# Patient Record
Sex: Female | Born: 1990 | Race: Black or African American | Hispanic: No | Marital: Single | Smoking: Never smoker
Health system: Southern US, Community
[De-identification: ages and names within clinical notes are randomized; demographics above are authoritative.]

## PROBLEM LIST (undated history)

## (undated) ENCOUNTER — Inpatient Hospital Stay (HOSPITAL_COMMUNITY): Payer: Self-pay

## (undated) DIAGNOSIS — Z789 Other specified health status: Secondary | ICD-10-CM

## (undated) DIAGNOSIS — R002 Palpitations: Secondary | ICD-10-CM

## (undated) DIAGNOSIS — J45909 Unspecified asthma, uncomplicated: Secondary | ICD-10-CM

## (undated) DIAGNOSIS — T7840XA Allergy, unspecified, initial encounter: Secondary | ICD-10-CM

## (undated) DIAGNOSIS — M7989 Other specified soft tissue disorders: Secondary | ICD-10-CM

## (undated) DIAGNOSIS — R011 Cardiac murmur, unspecified: Secondary | ICD-10-CM

## (undated) DIAGNOSIS — I10 Essential (primary) hypertension: Secondary | ICD-10-CM

## (undated) DIAGNOSIS — F419 Anxiety disorder, unspecified: Secondary | ICD-10-CM

## (undated) DIAGNOSIS — M549 Dorsalgia, unspecified: Secondary | ICD-10-CM

## (undated) DIAGNOSIS — R12 Heartburn: Secondary | ICD-10-CM

## (undated) DIAGNOSIS — N879 Dysplasia of cervix uteri, unspecified: Secondary | ICD-10-CM

## (undated) DIAGNOSIS — K219 Gastro-esophageal reflux disease without esophagitis: Secondary | ICD-10-CM

## (undated) DIAGNOSIS — R079 Chest pain, unspecified: Secondary | ICD-10-CM

## (undated) DIAGNOSIS — E559 Vitamin D deficiency, unspecified: Secondary | ICD-10-CM

## (undated) DIAGNOSIS — L309 Dermatitis, unspecified: Secondary | ICD-10-CM

## (undated) DIAGNOSIS — F909 Attention-deficit hyperactivity disorder, unspecified type: Secondary | ICD-10-CM

## (undated) DIAGNOSIS — B977 Papillomavirus as the cause of diseases classified elsewhere: Secondary | ICD-10-CM

## (undated) DIAGNOSIS — R0602 Shortness of breath: Secondary | ICD-10-CM

## (undated) DIAGNOSIS — M2550 Pain in unspecified joint: Secondary | ICD-10-CM

## (undated) DIAGNOSIS — E785 Hyperlipidemia, unspecified: Secondary | ICD-10-CM

## (undated) HISTORY — DX: Palpitations: R00.2

## (undated) HISTORY — DX: Vitamin D deficiency, unspecified: E55.9

## (undated) HISTORY — DX: Attention-deficit hyperactivity disorder, unspecified type: F90.9

## (undated) HISTORY — DX: Pain in unspecified joint: M25.50

## (undated) HISTORY — DX: Allergy, unspecified, initial encounter: T78.40XA

## (undated) HISTORY — DX: Dermatitis, unspecified: L30.9

## (undated) HISTORY — DX: Gastro-esophageal reflux disease without esophagitis: K21.9

## (undated) HISTORY — DX: Dorsalgia, unspecified: M54.9

## (undated) HISTORY — DX: Chest pain, unspecified: R07.9

## (undated) HISTORY — DX: Hyperlipidemia, unspecified: E78.5

## (undated) HISTORY — DX: Anxiety disorder, unspecified: F41.9

## (undated) HISTORY — DX: Essential (primary) hypertension: I10

## (undated) HISTORY — DX: Heartburn: R12

## (undated) HISTORY — DX: Other specified soft tissue disorders: M79.89

## (undated) HISTORY — DX: Shortness of breath: R06.02

---
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
## 2010-07-22 ENCOUNTER — Emergency Department (HOSPITAL_COMMUNITY)
Admission: EM | Admit: 2010-07-22 | Discharge: 2010-07-22 | Payer: Self-pay | Source: Home / Self Care | Admitting: Family Medicine

## 2011-01-10 ENCOUNTER — Inpatient Hospital Stay (INDEPENDENT_AMBULATORY_CARE_PROVIDER_SITE_OTHER)
Admission: RE | Admit: 2011-01-10 | Discharge: 2011-01-10 | Disposition: A | Discharge status: Home / Self Care | Payer: 59 | Source: Ambulatory Visit | Attending: Family Medicine | Admitting: Family Medicine

## 2011-01-10 DIAGNOSIS — L989 Disorder of the skin and subcutaneous tissue, unspecified: Secondary | ICD-10-CM

## 2011-01-12 LAB — HERPES SIMPLEX VIRUS CULTURE

## 2011-05-02 ENCOUNTER — Inpatient Hospital Stay (INDEPENDENT_AMBULATORY_CARE_PROVIDER_SITE_OTHER)
Admission: RE | Admit: 2011-05-02 | Discharge: 2011-05-02 | Disposition: A | Discharge status: Home / Self Care | Payer: 59 | Source: Ambulatory Visit | Attending: Emergency Medicine | Admitting: Emergency Medicine

## 2011-05-02 DIAGNOSIS — J040 Acute laryngitis: Secondary | ICD-10-CM

## 2011-05-02 DIAGNOSIS — J40 Bronchitis, not specified as acute or chronic: Secondary | ICD-10-CM

## 2011-05-02 DIAGNOSIS — J069 Acute upper respiratory infection, unspecified: Secondary | ICD-10-CM

## 2011-05-02 DIAGNOSIS — J45909 Unspecified asthma, uncomplicated: Secondary | ICD-10-CM

## 2013-04-18 ENCOUNTER — Emergency Department (HOSPITAL_COMMUNITY)
Admission: EM | Admit: 2013-04-18 | Discharge: 2013-04-18 | Disposition: A | Discharge status: Home / Self Care | Payer: 59 | Source: Home / Self Care

## 2013-04-18 ENCOUNTER — Emergency Department (INDEPENDENT_AMBULATORY_CARE_PROVIDER_SITE_OTHER): Payer: 59

## 2013-04-18 ENCOUNTER — Encounter (HOSPITAL_COMMUNITY): Payer: Self-pay | Admitting: *Deleted

## 2013-04-18 DIAGNOSIS — M5416 Radiculopathy, lumbar region: Secondary | ICD-10-CM

## 2013-04-18 DIAGNOSIS — IMO0002 Reserved for concepts with insufficient information to code with codable children: Secondary | ICD-10-CM

## 2013-04-18 MED ORDER — PREDNISONE 10 MG PO KIT: PACK | ORAL | Status: DC

## 2013-04-18 NOTE — ED Provider Notes (Signed)
Megan Norris is a 22 y.o. female who presents to Urgent Care today for right leg numbness. 2 months ago patient fell down concrete stairs landing on her buttocks several times. She notes mild to moderate right leg numbness and tingling from her buttocks to her foot. She denies any weakness or significant pain. Additionally she denies any back pain or buttocks pain. She has not tried any medications for this. Additionally she denies any bowel bladder dysfunction or difficulty walking. Her numbness and tingling is worse with prolonged standing and better with rest. She denies any nausea vomiting diarrhea and fevers and chills.    PMH reviewed. Healthy otherwise History  Substance Use Topics  . Smoking status: Never Smoker   . Smokeless tobacco: Not on file  . Alcohol Use: Not on file   ROS as above Medications reviewed. No current facility-administered medications for this encounter.   Current Outpatient Prescriptions  Medication Sig Dispense Refill  . montelukast (SINGULAIR) 10 MG tablet Take 10 mg by mouth at bedtime.      . PredniSONE 10 MG KIT 12 day dose pack po  1 kit  0    Exam:  BP 145/106  Pulse 79  Temp(Src) 98.8 F (37.1 C) (Oral)  Resp 18  SpO2 98%  LMP 04/12/2013 Gen: Well NAD HEENT: EOMI,  MMM Lungs: CTABL Nl WOB Heart: RRR no MRG Abd: NABS, NT, ND Exts: Non edematous BL  LE, warm and well perfused.  Back: Nontender to spinal midline normal back range of motion. Negative straight leg raise test Sensation is subjectively decreased in the right leg compared to the left Reflexes are diminished but equal bilaterally Strength is intact to hip flexion abduction, adduction Knee extension and flexion Ankle and great toe dorsiflexion and plantarflexion bilaterally  Capillary refill is intact distally Gait is normal.  No results found for this or any previous visit (from the past 24 hour(s)). Dg Lumbar Spine Complete  04/18/2013   *RADIOLOGY REPORT*  Clinical Data:  Right leg radiculopathy.  History of recent fall.  LUMBAR SPINE - COMPLETE 4+ VIEW  Comparison: No priors.  Findings: Five views of the lumbar spine demonstrate no definite acute displaced fracture or compression type fracture.  Alignment is anatomic.  No defects of the pars interarticularis are noted.  IMPRESSION: 1.  No acute radiographic abnormality of the lumbar spine to account for the patient's symptoms.   Original Report Authenticated By: Trudie Reed, M.D.   Dg Pelvis 1-2 Views  04/18/2013   *RADIOLOGY REPORT*  Clinical Data: Status post fall 2 months ago.  Right leg numbness. Low back pain.  PELVIS - 1-2 VIEW  Comparison: None.  Findings: Imaged bones, joints and soft tissues appear normal.  IMPRESSION: Normal examination.   Original Report Authenticated By: Holley Dexter, M.D.    Assessment and Plan: 22 y.o. female with right leg radicular symptoms.  Think the most likely explanation was direct trauma to the sciatic nerve 2 months ago.  She has bothersome numbness and tingling in her leg but does not have significant pain or weakness.  The x-rays today were as expected normal.  I think the most reasonable course of action here is for a 12 day prednisone dose pack, followed by referral to sports medicine. If patient is still symptomatic in 2 or 3 weeks an MRI or I nerve conduction study would be reasonable.  Discussed warning signs or symptoms. Please see discharge instructions. Patient expresses understanding.      Rodolph Bong,  MD 04/18/13 1010

## 2013-04-18 NOTE — ED Notes (Signed)
Pt c/o numbness in right leg x 1 week and lower to mid back pain. Pt stated she fell down a flight of concrete stairs  X 2 months ago and it could be related to sxs. Pt denies taking any meds for relief. Jan Ranson, SMA

## 2013-04-27 ENCOUNTER — Emergency Department (HOSPITAL_COMMUNITY)
Admission: EM | Admit: 2013-04-27 | Discharge: 2013-04-27 | Disposition: A | Discharge status: Home / Self Care | Payer: 59 | Attending: Emergency Medicine | Admitting: Emergency Medicine

## 2013-04-27 ENCOUNTER — Emergency Department (HOSPITAL_COMMUNITY): Payer: 59

## 2013-04-27 ENCOUNTER — Encounter (HOSPITAL_COMMUNITY): Payer: Self-pay | Admitting: *Deleted

## 2013-04-27 DIAGNOSIS — J45909 Unspecified asthma, uncomplicated: Secondary | ICD-10-CM | POA: Insufficient documentation

## 2013-04-27 DIAGNOSIS — Z79899 Other long term (current) drug therapy: Secondary | ICD-10-CM | POA: Insufficient documentation

## 2013-04-27 DIAGNOSIS — IMO0002 Reserved for concepts with insufficient information to code with codable children: Secondary | ICD-10-CM | POA: Insufficient documentation

## 2013-04-27 DIAGNOSIS — R0789 Other chest pain: Secondary | ICD-10-CM | POA: Diagnosis present

## 2013-04-27 DIAGNOSIS — R011 Cardiac murmur, unspecified: Secondary | ICD-10-CM | POA: Insufficient documentation

## 2013-04-27 HISTORY — DX: Cardiac murmur, unspecified: R01.1

## 2013-04-27 HISTORY — DX: Unspecified asthma, uncomplicated: J45.909

## 2013-04-27 MED ORDER — ACETAMINOPHEN 325 MG PO TABS
650.0000 mg | ORAL_TABLET | Freq: Once | ORAL | Status: AC
  Administered 2013-04-27: 650 mg via ORAL
  Filled 2013-04-27: qty 2

## 2013-04-27 MED ORDER — ALBUTEROL SULFATE HFA 108 (90 BASE) MCG/ACT IN AERS: 1.0000 | INHALATION_SPRAY | Freq: Four times a day (QID) | RESPIRATORY_TRACT | Status: DC | PRN

## 2013-04-27 MED ORDER — ALBUTEROL SULFATE HFA 108 (90 BASE) MCG/ACT IN AERS
4.0000 | INHALATION_SPRAY | Freq: Once | RESPIRATORY_TRACT | Status: AC
  Administered 2013-04-27: 4 via RESPIRATORY_TRACT
  Filled 2013-04-27: qty 6.7

## 2013-04-27 NOTE — ED Provider Notes (Signed)
CSN: [id number]     Arrival date & time 04/27/13  2841 History   First MD Initiated Contact with Patient 04/27/13 [id number]     Chief Complaint  Patient presents with  . Chest Pain   (Consider location/radiation/quality/duration/timing/severity/associated sxs/prior Treatment) Patient is a 22 y.o. female presenting with chest pain. The history is provided by the patient.  Chest Pain Pain location:  Substernal area Pain quality: pressure   Pain radiates to:  Does not radiate Pain radiates to the back: no   Pain severity:  Mild Onset quality:  Sudden Duration:  6 hours Timing:  Constant Progression:  Waxing and waning Chronicity:  New Context comment:  While walking down the hall Relieved by:  Nothing Worsened by:  Nothing tried Ineffective treatments:  None tried Associated symptoms: no abdominal pain, no back pain, no cough, no dizziness, no fatigue, no fever, no headache, no nausea, no shortness of breath and not vomiting     Past Medical History  Diagnosis Date  . Heart murmur   . Asthma    History reviewed. No pertinent past surgical history. History reviewed. No pertinent family history. History  Substance Use Topics  . Smoking status: Never Smoker   . Smokeless tobacco: Not on file  . Alcohol Use: Yes   OB History   Grav Para Term Preterm Abortions TAB SAB Ect Mult Living                 Review of Systems  Constitutional: Negative for fever and fatigue.  HENT: Negative for congestion, drooling and neck pain.   Eyes: Negative for pain.  Respiratory: Negative for cough and shortness of breath.   Cardiovascular: Positive for chest pain.  Gastrointestinal: Negative for nausea, vomiting, abdominal pain and diarrhea.  Genitourinary: Negative for dysuria and hematuria.  Musculoskeletal: Negative for back pain and gait problem.  Skin: Negative for color change.  Neurological: Negative for dizziness and headaches.  Hematological: Negative for adenopathy.   Psychiatric/Behavioral: Negative for behavioral problems.  All other systems reviewed and are negative.    Allergies  Sulfa antibiotics  Home Medications   Current Outpatient Rx  Name  Route  Sig  Dispense  Refill  . montelukast (SINGULAIR) 10 MG tablet   Oral   Take 10 mg by mouth at bedtime.         . PredniSONE 10 MG KIT      12 day dose pack po   1 kit   0    BP 143/89  Temp(Src) 98.2 F (36.8 C) (Oral)  Resp 14  SpO2 98%  LMP 04/12/2013 Physical Exam  Nursing note and vitals reviewed. Constitutional: She is oriented to person, place, and time. She appears well-developed and well-nourished.  HENT:  Head: Normocephalic.  Mouth/Throat: No oropharyngeal exudate.  Eyes: Conjunctivae and EOM are normal. Pupils are equal, round, and reactive to light.  Neck: Normal range of motion. Neck supple.  Cardiovascular: Normal rate, regular rhythm, normal heart sounds and intact distal pulses.  Exam reveals no gallop and no friction rub.   No murmur heard. Pulmonary/Chest: Effort normal and breath sounds normal. No respiratory distress. She has no wheezes. She exhibits no tenderness.  Abdominal: Soft. Bowel sounds are normal. There is no tenderness. There is no rebound and no guarding.  Musculoskeletal: Normal range of motion. She exhibits no edema and no tenderness.  Neurological: She is alert and oriented to person, place, and time.  Skin: Skin is warm and dry.  Psychiatric: She has  a normal mood and affect. Her behavior is normal.    ED Course  Procedures (including critical care time) Labs Review Labs Reviewed - No data to display Imaging Review Dg Chest 2 View  04/27/2013   *RADIOLOGY REPORT*  Clinical Data: Chest pain and pressure.  CHEST - 2 VIEW  Comparison: 07/22/2010  Findings: The heart size and pulmonary vascularity are normal. The lungs appear clear and expanded without focal air space disease or consolidation. No blunting of the costophrenic angles.  No  pneumothorax.  Mediastinal contours appear intact.  No significant change since previous study.  IMPRESSION: No evidence of active pulmonary disease.   Original Report Authenticated By: Burman Nieves, M.D.      Date: 04/27/2013  Rate: 71  Rhythm: normal sinus rhythm  QRS Axis: normal  Intervals: normal  ST/T Wave abnormalities: normal  Conduction Disutrbances:none  Narrative Interpretation: No ST or T wave changes consistent with ischemia.    Old EKG Reviewed: none available   MDM   1. Atypical chest pain    3:57 AM 22 y.o. female w hx of asthma currently on prednisone for suspected radiculopathy who pw central cp that began at 10pm yesterday evening while walking down the hall. The patient notes waxing waning pain since then. Currently very mild discomfort on exam. The patient appears well and is in no acute distress. Her vital signs are unremarkable here. She is well negative but does take oral contraceptives. She she notes she has had mild shortness of breath for one week which she attributes to asthma. Low suspicion for PE as the patient is a nonsmoker, not tachycardic, not short of breath on exam, and otherwise appears well. 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 Will get chest x-ray, EKG, albuterol to see if it provides any relief.  5:45 AM: Pt feeling better. Sx likely assoc w/ mild asthma. I have discussed the diagnosis/risks/treatment options with the patient and believe the pt to be eligible for discharge home to follow-up with pcp as needed. We also discussed returning to the ED immediately if new or worsening sx occur. We discussed the sx which are most concerning (e.g., return of pain, sob, fever) that necessitate immediate return. Any new prescriptions provided to the patient are listed below.  Discharge Medication List as of 04/27/2013  5:47 AM    START taking these medications   Details  !! albuterol  (PROVENTIL HFA;VENTOLIN HFA) 108 (90 BASE) MCG/ACT inhaler Inhale 1-2 puffs into the lungs every 6 (six) hours as needed for wheezing., Starting 04/27/2013, Until Discontinued, Print     !! - Potential duplicate medications found. Please discuss with provider.       Junius Argyle, MD 04/27/13 [phone]

## 2013-04-27 NOTE — ED Notes (Signed)
Pt states that last week she had swelling bilateral legs. Pt states that the swelling went away after sleeping at night.

## 2013-10-11 ENCOUNTER — Other Ambulatory Visit: Payer: Self-pay | Admitting: Physician Assistant

## 2013-10-11 DIAGNOSIS — R58 Hemorrhage, not elsewhere classified: Secondary | ICD-10-CM

## 2013-10-17 ENCOUNTER — Ambulatory Visit
Admission: RE | Admit: 2013-10-17 | Discharge: 2013-10-17 | Disposition: A | Discharge status: Home / Self Care | Payer: BC Managed Care – PPO | Source: Ambulatory Visit | Attending: Physician Assistant | Admitting: Physician Assistant

## 2013-10-17 DIAGNOSIS — R58 Hemorrhage, not elsewhere classified: Secondary | ICD-10-CM

## 2015-05-02 DIAGNOSIS — J4520 Mild intermittent asthma, uncomplicated: Secondary | ICD-10-CM | POA: Insufficient documentation

## 2015-05-02 DIAGNOSIS — J309 Allergic rhinitis, unspecified: Principal | ICD-10-CM

## 2015-05-02 DIAGNOSIS — H1010 Acute atopic conjunctivitis, unspecified eye: Secondary | ICD-10-CM

## 2015-05-29 ENCOUNTER — Ambulatory Visit (INDEPENDENT_AMBULATORY_CARE_PROVIDER_SITE_OTHER): Payer: 59

## 2015-05-29 ENCOUNTER — Other Ambulatory Visit: Payer: Self-pay | Admitting: *Deleted

## 2015-05-29 DIAGNOSIS — J309 Allergic rhinitis, unspecified: Secondary | ICD-10-CM

## 2015-05-29 NOTE — Telephone Encounter (Signed)
Denied Epipen 2 Pack pt needs office visit last visit 06/11/13. Faxed back to [facility] [phone].

## 2015-06-11 ENCOUNTER — Ambulatory Visit (INDEPENDENT_AMBULATORY_CARE_PROVIDER_SITE_OTHER): Payer: 59

## 2015-06-11 DIAGNOSIS — J4540 Moderate persistent asthma, uncomplicated: Secondary | ICD-10-CM

## 2015-07-21 DIAGNOSIS — J309 Allergic rhinitis, unspecified: Secondary | ICD-10-CM

## 2015-07-21 NOTE — Progress Notes (Signed)
This encounter was created in error - please disregard.

## 2015-07-31 ENCOUNTER — Encounter: Payer: 59 | Admitting: Neurology

## 2015-08-04 ENCOUNTER — Ambulatory Visit (INDEPENDENT_AMBULATORY_CARE_PROVIDER_SITE_OTHER): Payer: 59 | Admitting: *Deleted

## 2015-08-04 DIAGNOSIS — J309 Allergic rhinitis, unspecified: Secondary | ICD-10-CM | POA: Diagnosis not present

## 2015-08-07 NOTE — Progress Notes (Signed)
This encounter was created in error - please disregard.

## 2015-08-21 ENCOUNTER — Ambulatory Visit (INDEPENDENT_AMBULATORY_CARE_PROVIDER_SITE_OTHER): Payer: 59 | Admitting: Neurology

## 2015-08-21 DIAGNOSIS — J309 Allergic rhinitis, unspecified: Secondary | ICD-10-CM | POA: Diagnosis not present

## 2015-08-23 HISTORY — PX: WISDOM TOOTH EXTRACTION: SHX21

## 2015-08-28 ENCOUNTER — Ambulatory Visit (INDEPENDENT_AMBULATORY_CARE_PROVIDER_SITE_OTHER): Payer: 59 | Admitting: *Deleted

## 2015-08-28 DIAGNOSIS — J309 Allergic rhinitis, unspecified: Secondary | ICD-10-CM

## 2015-09-08 ENCOUNTER — Ambulatory Visit (INDEPENDENT_AMBULATORY_CARE_PROVIDER_SITE_OTHER): Payer: 59 | Admitting: *Deleted

## 2015-09-08 DIAGNOSIS — J309 Allergic rhinitis, unspecified: Secondary | ICD-10-CM | POA: Diagnosis not present

## 2015-09-17 ENCOUNTER — Ambulatory Visit (INDEPENDENT_AMBULATORY_CARE_PROVIDER_SITE_OTHER): Payer: 59

## 2015-09-17 DIAGNOSIS — J309 Allergic rhinitis, unspecified: Secondary | ICD-10-CM

## 2015-10-08 DIAGNOSIS — J301 Allergic rhinitis due to pollen: Secondary | ICD-10-CM | POA: Diagnosis not present

## 2015-10-09 DIAGNOSIS — J3081 Allergic rhinitis due to animal (cat) (dog) hair and dander: Secondary | ICD-10-CM | POA: Diagnosis not present

## 2015-10-16 ENCOUNTER — Ambulatory Visit (INDEPENDENT_AMBULATORY_CARE_PROVIDER_SITE_OTHER): Payer: 59

## 2015-10-16 DIAGNOSIS — J309 Allergic rhinitis, unspecified: Secondary | ICD-10-CM | POA: Diagnosis not present

## 2015-10-21 MED FILL — HYDROCODON-APAP 10-325: 10-325 | 5 days supply | Qty: 20 | Fill #0

## 2015-10-23 ENCOUNTER — Ambulatory Visit (INDEPENDENT_AMBULATORY_CARE_PROVIDER_SITE_OTHER): Payer: 59 | Admitting: *Deleted

## 2015-10-23 DIAGNOSIS — J309 Allergic rhinitis, unspecified: Secondary | ICD-10-CM

## 2015-11-02 MED FILL — LEVOCETIRIZINE 5 MG TABLET: 5 | 30 days supply | Qty: 30 | Fill #0

## 2015-11-02 MED FILL — AZITHROMYCIN 250 MG TABLET: 250 | 5 days supply | Qty: 6 | Fill #0

## 2015-11-02 MED FILL — VIT D2 1.25 MG (50,000 UNIT: 1.25 MG | 30 days supply | Qty: 9 | Fill #2

## 2015-11-02 MED FILL — MONTELUKAST SOD 10 MG TAB: 10 | 30 days supply | Qty: 30 | Fill #0

## 2015-11-12 ENCOUNTER — Ambulatory Visit (INDEPENDENT_AMBULATORY_CARE_PROVIDER_SITE_OTHER): Payer: 59

## 2015-11-12 DIAGNOSIS — J309 Allergic rhinitis, unspecified: Secondary | ICD-10-CM | POA: Diagnosis not present

## 2016-01-05 ENCOUNTER — Ambulatory Visit (INDEPENDENT_AMBULATORY_CARE_PROVIDER_SITE_OTHER): Payer: 59

## 2016-01-05 DIAGNOSIS — J309 Allergic rhinitis, unspecified: Secondary | ICD-10-CM

## 2016-02-11 ENCOUNTER — Ambulatory Visit (INDEPENDENT_AMBULATORY_CARE_PROVIDER_SITE_OTHER): Payer: 59

## 2016-02-11 DIAGNOSIS — J309 Allergic rhinitis, unspecified: Secondary | ICD-10-CM

## 2016-03-21 MED FILL — LEVOCETIRIZINE 5 MG TABLET: 5 | 30 days supply | Qty: 30 | Fill #1

## 2016-03-21 MED FILL — MONTELUKAST SOD 10 MG TAB: 10 | 30 days supply | Qty: 30 | Fill #1

## 2016-03-24 ENCOUNTER — Ambulatory Visit: Payer: Self-pay

## 2016-03-24 DIAGNOSIS — J309 Allergic rhinitis, unspecified: Secondary | ICD-10-CM

## 2016-04-01 DIAGNOSIS — J3089 Other allergic rhinitis: Secondary | ICD-10-CM | POA: Diagnosis not present

## 2016-04-15 ENCOUNTER — Ambulatory Visit (INDEPENDENT_AMBULATORY_CARE_PROVIDER_SITE_OTHER): Payer: 59

## 2016-04-15 DIAGNOSIS — J309 Allergic rhinitis, unspecified: Secondary | ICD-10-CM

## 2016-04-15 NOTE — Progress Notes (Signed)
Immunotherapy   Patient Details  Name: Megan Norris MRN: [id number] Date of Birth: 08/21/1991  04/15/2016  Megan Norris  Patient came in today to receive ITX and pick-up vials.  She will be receiving ITX at the office of Malachy Chamberakia Starkes, NP on [address] [place].   Following schedule: B  Frequency:Once weekly Epi-Pen:Yes--Patient states EpiPen is current and she understands its use.   Consent signed and patient instructions given. All paperwork and vials released to patient.  Proper paperwork received and signed by Malachy Chamberakia Starkes, NP and sent to be scanned into EPIC.   Nida Boatmanatricia Khalis Hittle 04/15/2016, 4:57 PM

## 2016-05-19 MED FILL — predniSONE 10 MG TABS: 10 | 6 days supply | Qty: 21 | Fill #0

## 2016-05-26 ENCOUNTER — Telehealth: Payer: Self-pay | Admitting: *Deleted

## 2016-05-26 NOTE — Telephone Encounter (Signed)
PT WOULD LIKE A RETURN CALL REGARDING HER BILL. 

## 2016-05-26 NOTE — Telephone Encounter (Signed)
WE HAD TURNED HER OVER IN MM BECAUSE WE DID NOT CHANGED HER ADDRESS WHEN IT WAS CHANGED IN EPIC - PULLED FROM [facility]  - SHE WILL PAY TODAY

## 2016-08-22 DIAGNOSIS — B977 Papillomavirus as the cause of diseases classified elsewhere: Secondary | ICD-10-CM

## 2016-08-22 HISTORY — PX: LEEP: SHX91

## 2016-08-22 HISTORY — DX: Papillomavirus as the cause of diseases classified elsewhere: B97.7

## 2016-09-15 MED FILL — MONTELUKAST SOD 10 MG TAB: 10 | 30 days supply | Qty: 30 | Fill #2

## 2016-09-15 MED FILL — JOLESSA 0.15 MG-0.03 MG TAB: 0.15-0.03 | 90 days supply | Qty: 91 | Fill #1

## 2016-09-15 MED FILL — LEVOCETIRIZINE 5 MG TABLET: 5 | 30 days supply | Qty: 30 | Fill #2

## 2016-09-15 MED FILL — JOLESSA 0.15 MG-0.03 MG TAB: 0.15-0.03 | 90 days supply | Qty: 91 | Fill #0

## 2016-09-19 ENCOUNTER — Other Ambulatory Visit: Payer: Self-pay | Admitting: Family

## 2016-09-19 DIAGNOSIS — Z7251 High risk heterosexual behavior: Secondary | ICD-10-CM | POA: Diagnosis not present

## 2016-09-19 DIAGNOSIS — N6012 Diffuse cystic mastopathy of left breast: Secondary | ICD-10-CM | POA: Diagnosis not present

## 2016-09-19 DIAGNOSIS — E559 Vitamin D deficiency, unspecified: Secondary | ICD-10-CM | POA: Diagnosis not present

## 2016-09-19 DIAGNOSIS — Z79899 Other long term (current) drug therapy: Secondary | ICD-10-CM | POA: Diagnosis not present

## 2016-09-19 DIAGNOSIS — Z0000 Encounter for general adult medical examination without abnormal findings: Secondary | ICD-10-CM | POA: Diagnosis not present

## 2016-09-21 NOTE — Addendum Note (Signed)
Addended by: Maryjean MornFREEMAN, Alizabeth Antonio D on: 09/21/2016 10:41 AM   Modules accepted: Orders

## 2016-10-03 ENCOUNTER — Ambulatory Visit
Admission: RE | Admit: 2016-10-03 | Discharge: 2016-10-03 | Disposition: A | Discharge status: Home / Self Care | Payer: 59 | Source: Ambulatory Visit | Attending: Family | Admitting: Family

## 2016-10-03 ENCOUNTER — Ambulatory Visit
Admission: RE | Admit: 2016-10-03 | Discharge: 2016-10-03 | Disposition: A | Discharge status: Home / Self Care | Payer: Self-pay | Source: Ambulatory Visit | Attending: Family | Admitting: Family

## 2016-10-03 DIAGNOSIS — N6012 Diffuse cystic mastopathy of left breast: Secondary | ICD-10-CM

## 2016-10-03 DIAGNOSIS — N644 Mastodynia: Secondary | ICD-10-CM | POA: Diagnosis not present

## 2016-10-06 MED FILL — VIT D2 1.25 MG (50,000 UNIT: 1.25 MG | 28 days supply | Qty: 8 | Fill #0

## 2016-11-22 MED FILL — LEVOCETIRIZINE 5 MG TABLET: 5 | 30 days supply | Qty: 30 | Fill #0

## 2016-11-22 MED FILL — MONTELUKAST SOD 10 MG TAB: 10 | 90 days supply | Qty: 90 | Fill #0

## 2016-12-06 MED FILL — JOLESSA 0.15 MG-0.03 MG TAB: 0.15-0.03 | 90 days supply | Qty: 91 | Fill #0

## 2017-01-17 ENCOUNTER — Other Ambulatory Visit: Payer: Self-pay | Admitting: Obstetrics and Gynecology

## 2017-01-17 ENCOUNTER — Other Ambulatory Visit (HOSPITAL_COMMUNITY)
Admission: RE | Admit: 2017-01-17 | Discharge: 2017-01-17 | Disposition: A | Discharge status: Home / Self Care | Payer: 59 | Source: Ambulatory Visit | Attending: Obstetrics and Gynecology | Admitting: Obstetrics and Gynecology

## 2017-01-17 DIAGNOSIS — Z124 Encounter for screening for malignant neoplasm of cervix: Secondary | ICD-10-CM | POA: Insufficient documentation

## 2017-01-17 DIAGNOSIS — R87610 Atypical squamous cells of undetermined significance on cytologic smear of cervix (ASC-US): Secondary | ICD-10-CM | POA: Diagnosis not present

## 2017-01-17 DIAGNOSIS — N898 Other specified noninflammatory disorders of vagina: Secondary | ICD-10-CM | POA: Diagnosis not present

## 2017-01-17 DIAGNOSIS — Z01411 Encounter for gynecological examination (general) (routine) with abnormal findings: Secondary | ICD-10-CM | POA: Diagnosis not present

## 2017-01-24 LAB — CYTOLOGY - PAP
DIAGNOSIS: UNDETERMINED — AB
HPV: DETECTED — AB

## 2017-02-13 MED FILL — LEVOCETIRIZINE 5 MG TABLET: 5 | 30 days supply | Qty: 30 | Fill #1

## 2017-02-14 ENCOUNTER — Other Ambulatory Visit: Payer: Self-pay | Admitting: Obstetrics and Gynecology

## 2017-02-14 DIAGNOSIS — N871 Moderate cervical dysplasia: Secondary | ICD-10-CM | POA: Diagnosis not present

## 2017-02-14 DIAGNOSIS — Z3202 Encounter for pregnancy test, result negative: Secondary | ICD-10-CM | POA: Diagnosis not present

## 2017-02-14 DIAGNOSIS — R87610 Atypical squamous cells of undetermined significance on cytologic smear of cervix (ASC-US): Secondary | ICD-10-CM | POA: Diagnosis not present

## 2017-02-14 DIAGNOSIS — R87810 Cervical high risk human papillomavirus (HPV) DNA test positive: Secondary | ICD-10-CM | POA: Diagnosis not present

## 2017-02-14 DIAGNOSIS — R87619 Unspecified abnormal cytological findings in specimens from cervix uteri: Secondary | ICD-10-CM | POA: Diagnosis not present

## 2017-02-14 DIAGNOSIS — Z113 Encounter for screening for infections with a predominantly sexual mode of transmission: Secondary | ICD-10-CM | POA: Diagnosis not present

## 2017-02-14 DIAGNOSIS — N870 Mild cervical dysplasia: Secondary | ICD-10-CM | POA: Diagnosis not present

## 2017-02-15 DIAGNOSIS — F902 Attention-deficit hyperactivity disorder, combined type: Secondary | ICD-10-CM | POA: Diagnosis not present

## 2017-02-15 MED FILL — TINIDAZOLE 500 MG TABLET: 500 | 5 days supply | Qty: 10 | Fill #0

## 2017-02-20 MED FILL — MONTELUKAST SOD 10 MG TAB: 10 | 90 days supply | Qty: 90 | Fill #1

## 2017-02-20 MED FILL — CONCERTA ER 27 MG TABLET: 27 | 30 days supply | Qty: 30 | Fill #0

## 2017-02-20 MED FILL — JOLESSA 0.15 MG-0.03 MG TAB: 0.15-0.03 | 90 days supply | Qty: 91 | Fill #1

## 2017-07-03 MED FILL — LEVOCETIRIZINE 5 MG TABLET: 5 | 30 days supply | Qty: 30 | Fill #2

## 2017-07-03 MED FILL — medroxyPROGESTERone ACETATE: 150 | 84 days supply | Qty: 1 | Fill #0

## 2017-08-18 LAB — OB RESULTS CONSOLE RPR: RPR: NONREACTIVE

## 2017-08-18 LAB — OB RESULTS CONSOLE HEPATITIS B SURFACE ANTIGEN: HEP B S AG: NEGATIVE

## 2017-08-18 LAB — OB RESULTS CONSOLE RUBELLA ANTIBODY, IGM: RUBELLA: IMMUNE

## 2017-08-18 LAB — OB RESULTS CONSOLE HIV ANTIBODY (ROUTINE TESTING): HIV: NONREACTIVE

## 2017-08-22 NOTE — L&D Delivery Note (Signed)
Operative Delivery Note  There was distress with repetitive late decels after mother was noted to be C/C/+3 and pushing.  Discussed with patient operative delivery with a vacuum. Verbal consent: obtained from patient.   Risks and benefits discussed in detail.  Risks include, but are not limited to the risks of anesthesia, bleeding, infection, damage to maternal tissues, fetal cephalhematoma.  There is also the risk of inability to effect vaginal delivery of the head, or shoulder dystocia that cannot be resolved by established maneuvers, leading to the need for emergency cesarean section.  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 Bladder was just previously emptied by removing the foley catheter. Epidural was found to be adequate.  Vertex was +3 station.  The Kiwi vacuum was placed.  With maternal effort and 1 pull,  At 4:10 AM a  viable and healthy female was delivered via Vaginal, Vacuum delivery.   Presentation: vertex; Position: Occiput,, Anterior; shoulders and body were easily delivered. Cord was double clamped and cut and the infant handed to the waiting Pediatricians.  Infant was noted to be moving all four extremities and crying vigorously. Cord gases and cord blood was obtained. The placenta delivered spontaneously intact 3 vessels noted.    Uterine atony was alleviated by massage and IV pitocin.  No lacerations noted.   APGAR: 8, 9; weight 3 lb 15.5 oz (1800 g).   Anesthesia:  Epidural Instruments: Kiwi Episiotomy: None Lacerations: None Suture Repair: n/a Est. Blood Loss (mL): 300  Mom to postpartum.  Baby to NICU.  Essie HartINN, Pheonix Cl[employer] STACIA 02/28/2018, 5:47 AM

## 2017-09-18 ENCOUNTER — Other Ambulatory Visit (HOSPITAL_COMMUNITY)
Admission: RE | Admit: 2017-09-18 | Discharge: 2017-09-18 | Disposition: A | Discharge status: Home / Self Care | Payer: Self-pay | Source: Ambulatory Visit | Attending: Obstetrics and Gynecology | Admitting: Obstetrics and Gynecology

## 2017-09-18 ENCOUNTER — Other Ambulatory Visit: Payer: Self-pay | Admitting: Obstetrics and Gynecology

## 2017-09-18 DIAGNOSIS — Z01419 Encounter for gynecological examination (general) (routine) without abnormal findings: Secondary | ICD-10-CM | POA: Insufficient documentation

## 2017-09-18 LAB — OB RESULTS CONSOLE GC/CHLAMYDIA: CHLAMYDIA, DNA PROBE: NEGATIVE

## 2017-09-18 LAB — OB RESULTS CONSOLE ABO/RH: RH Type: POSITIVE

## 2017-09-21 LAB — CYTOLOGY - PAP
Diagnosis: NEGATIVE
HPV: NOT DETECTED

## 2018-01-17 MED FILL — MONTELUKAST SOD 10 MG TAB: 10 | 30 days supply | Qty: 30 | Fill #0

## 2018-01-17 MED FILL — LEVOCETIRIZINE 5 MG TABLET: 5 | 30 days supply | Qty: 30 | Fill #0

## 2018-01-23 ENCOUNTER — Inpatient Hospital Stay (HOSPITAL_COMMUNITY)
Admission: AD | Admit: 2018-01-23 | Discharge: 2018-01-23 | Disposition: A | Discharge status: Home / Self Care | Payer: Medicaid Other | Source: Ambulatory Visit | Attending: Obstetrics and Gynecology | Admitting: Obstetrics and Gynecology

## 2018-01-23 ENCOUNTER — Encounter (HOSPITAL_COMMUNITY): Payer: Self-pay | Admitting: *Deleted

## 2018-01-23 ENCOUNTER — Other Ambulatory Visit: Payer: Self-pay

## 2018-01-23 DIAGNOSIS — O26893 Other specified pregnancy related conditions, third trimester: Secondary | ICD-10-CM | POA: Diagnosis not present

## 2018-01-23 DIAGNOSIS — R109 Unspecified abdominal pain: Secondary | ICD-10-CM | POA: Diagnosis present

## 2018-01-23 DIAGNOSIS — Z3A30 30 weeks gestation of pregnancy: Secondary | ICD-10-CM | POA: Diagnosis not present

## 2018-01-23 DIAGNOSIS — N898 Other specified noninflammatory disorders of vagina: Secondary | ICD-10-CM | POA: Insufficient documentation

## 2018-01-23 DIAGNOSIS — J45909 Unspecified asthma, uncomplicated: Secondary | ICD-10-CM | POA: Insufficient documentation

## 2018-01-23 DIAGNOSIS — Z79899 Other long term (current) drug therapy: Secondary | ICD-10-CM | POA: Diagnosis not present

## 2018-01-23 DIAGNOSIS — Z3689 Encounter for other specified antenatal screening: Secondary | ICD-10-CM

## 2018-01-23 DIAGNOSIS — O99513 Diseases of the respiratory system complicating pregnancy, third trimester: Secondary | ICD-10-CM | POA: Insufficient documentation

## 2018-01-23 LAB — URINALYSIS, ROUTINE W REFLEX MICROSCOPIC
BILIRUBIN URINE: NEGATIVE
Glucose, UA: NEGATIVE mg/dL
HGB URINE DIPSTICK: NEGATIVE
Ketones, ur: NEGATIVE mg/dL
Leukocytes, UA: NEGATIVE
Nitrite: NEGATIVE
PROTEIN: NEGATIVE mg/dL
SPECIFIC GRAVITY, URINE: 1.006 (ref 1.005–1.030)
pH: 6 (ref 5.0–8.0)

## 2018-01-23 LAB — WET PREP, GENITAL
CLUE CELLS WET PREP: NONE SEEN
Sperm: NONE SEEN
TRICH WET PREP: NONE SEEN
Yeast Wet Prep HPF POC: NONE SEEN

## 2018-01-23 NOTE — MAU Note (Signed)
Been leaking since last night, was thick and creamish, today was thinner and yellow, still having some mucous substance to it.  Having some cramping here and there( mild every couple hours).  At times feels burning in her pelvis, not pressure.

## 2018-01-23 NOTE — Discharge Instructions (Signed)
Braxton Hicks Contractions °Contractions of the uterus can occur throughout pregnancy, but they are not always a sign that you are in labor. You may have practice contractions called Braxton Hicks contractions. These false labor contractions are sometimes confused with true labor. °What are Braxton Hicks contractions? °Braxton Hicks contractions are tightening movements that occur in the muscles of the uterus before labor. Unlike true labor contractions, these contractions do not result in opening (dilation) and thinning of the cervix. Toward the end of pregnancy (32-34 weeks), Braxton Hicks contractions can happen more often and may become stronger. These contractions are sometimes difficult to tell apart from true labor because they can be very uncomfortable. You should not feel embarrassed if you go to the hospital with false labor. °Sometimes, the only way to tell if you are in true labor is for your health care provider to look for changes in the cervix. The health care provider will do a physical exam and may monitor your contractions. If you are not in true labor, the exam should show that your cervix is not dilating and your water has not broken. °If there are other health problems associated with your pregnancy, it is completely safe for you to be sent home with false labor. You may continue to have Braxton Hicks contractions until you go into true labor. °How to tell the difference between true labor and false labor °True labor °· Contractions last 30-70 seconds. °· Contractions become very regular. °· Discomfort is usually felt in the top of the uterus, and it spreads to the lower abdomen and low back. °· Contractions do not go away with walking. °· Contractions usually become more intense and increase in frequency. °· The cervix dilates and gets thinner. °False labor °· Contractions are usually shorter and not as strong as true labor contractions. °· Contractions are usually irregular. °· Contractions  are often felt in the front of the lower abdomen and in the groin. °· Contractions may go away when you walk around or change positions while lying down. °· Contractions get weaker and are shorter-lasting as time goes on. °· The cervix usually does not dilate or become thin. °Follow these instructions at home: °· Take over-the-counter and prescription medicines only as told by your health care provider. °· Keep up with your usual exercises and follow other instructions from your health care provider. °· Eat and drink lightly if you think you are going into labor. °· If Braxton Hicks contractions are making you uncomfortable: °? Change your position from lying down or resting to walking, or change from walking to resting. °? Sit and rest in a tub of warm water. °? Drink enough fluid to keep your urine pale yellow. Dehydration may cause these contractions. °? Do slow and deep breathing several times an hour. °· Keep all follow-up prenatal visits as told by your health care provider. This is important. °Contact a health care provider if: °· You have a fever. °· You have continuous pain in your abdomen. °Get help right away if: °· Your contractions become stronger, more regular, and closer together. °· You have fluid leaking or gushing from your vagina. °· You pass blood-tinged mucus (bloody show). °· You have bleeding from your vagina. °· You have low back pain that you never had before. °· You feel your baby’s head pushing down and causing pelvic pressure. °· Your baby is not moving inside you as much as it used to. °Summary °· Contractions that occur before labor are called Braxton   Hicks contractions, false labor, or practice contractions. °· Braxton Hicks contractions are usually shorter, weaker, farther apart, and less regular than true labor contractions. True labor contractions usually become progressively stronger and regular and they become more frequent. °· Manage discomfort from Braxton Hicks contractions by  changing position, resting in a warm bath, drinking plenty of water, or practicing deep breathing. °This information is not intended to replace advice given to you by your health care provider. Make sure you discuss any questions you have with your health care provider. °Document Released: 12/22/2016 Document Revised: 12/22/2016 Document Reviewed: 12/22/2016 °Elsevier Interactive Patient Education © 2018 [employer] ° °

## 2018-01-23 NOTE — MAU Provider Note (Signed)
History     CSN: [id number]  Arrival date and time: 01/23/18 1642   First Provider Initiated Contact with Patient 01/23/18 1852      Chief Complaint  Patient presents with  . Abdominal Pain  . Vaginal Discharge   G2P0010 @30 .5 weeks here with vaginal discharge and cramping. Discharge started yesterday, was creamy then became thick and yellow today. No malodor, no itching. No recent IC. Cramping started yesterday. Located in lower abdomen, and intermittent. Denies urinary sx. Denies VB or LOF or gush of fluid. Good FM.   OB History    Gravida  2   Para      Term      Preterm      AB  1   Living        SAB      TAB  1   Ectopic      Multiple      Live Births              Past Medical History:  Diagnosis Date  . Asthma   . Heart murmur     Past Surgical History:  Procedure Laterality Date  . LEEP  2018  . WISDOM TOOTH EXTRACTION Bilateral 2017    Family History  Problem Relation Age of Onset  . Breast cancer Maternal Grandmother   . Diabetes Maternal Grandmother   . Hypertension Maternal Grandmother   . Hyperlipidemia Maternal Grandmother   . Heart disease Maternal Grandmother   . Hyperlipidemia Father   . Hypertension Sister   . Stroke Paternal Uncle   . Cancer Maternal Grandfather   . Diabetes Paternal Grandmother     Social History   Tobacco Use  . Smoking status: Never Smoker  . Smokeless tobacco: Never Used  Substance Use Topics  . Alcohol use: Not Currently  . Drug use: No    Allergies:  Allergies  Allergen Reactions  . Sulfa Antibiotics Other (See Comments)    Swelling from sulfa eye drops as a child    Medications Prior to Admission  Medication Sig Dispense Refill Last Dose  . acetaminophen (TYLENOL) 500 MG tablet Take 1,000 mg by mouth daily as needed for pain.   1-2 weeks ago  . albuterol (PROAIR HFA) 108 (90 BASE) MCG/ACT inhaler Inhale 2 puffs into the lungs 2 (two) times daily as needed (asthma).   04/27/2013 at  Unknown  . albuterol (PROVENTIL HFA;VENTOLIN HFA) 108 (90 BASE) MCG/ACT inhaler Inhale 1-2 puffs into the lungs every 6 (six) hours as needed for wheezing. 1 Inhaler 0   . Calcium-Phosphorus-Vitamin D (CALCIUM/D3 ADULT GUMMIES PO) Take 2 tablets by mouth 2 (two) times daily.   04/26/2013 at Unknown  . Fluticasone-Salmeterol (ADVAIR) 100-50 MCG/DOSE AEPB Inhale 1 puff into the lungs daily as needed (asthma).   4 days ago  . montelukast (SINGULAIR) 10 MG tablet Take 10 mg by mouth at bedtime.   04/24/2013  . norgestimate-ethinyl estradiol (ORTHO-CYCLEN,SPRINTEC,PREVIFEM) 0.25-35 MG-MCG tablet Take 1 tablet by mouth at bedtime.   04/26/2013 at Unknown  . predniSONE (DELTASONE) 10 MG tablet Take 10-60 mg by mouth daily. 12 day tapered course per package directions   04/26/2013 at Unknown    Review of Systems  Constitutional: Negative for fever.  Gastrointestinal: Positive for abdominal pain.  Genitourinary: Positive for frequency and vaginal discharge. Negative for dysuria, urgency and vaginal bleeding.   Physical Exam   Blood pressure 122/78, pulse 74, temperature 98.5 F (36.9 C), temperature source Oral, resp. rate 17,  weight 213 lb 8 oz (96.8 kg), SpO2 99 %.  Physical Exam  Constitutional: She appears well-developed and well-nourished.  HENT:  Head: Normocephalic and atraumatic.  Neck: Normal range of motion.  Cardiovascular: Normal rate.  Respiratory: Effort normal. No respiratory distress.  GI: Soft. She exhibits no distension. There is no tenderness.  gravid  Genitourinary:  Genitourinary Comments: External: no lesions or erythema Vagina: rugated, pink, moist, thick yellow discharge Cervix closed/thick   Musculoskeletal: Normal range of motion.  Neurological: She is alert.  Skin: Skin is warm and dry.  Psychiatric: She has a normal mood and affect.  EFM: 135 bpm, mod variability, + accels, no decels Toco: none  Results for orders placed or performed during the hospital encounter of  01/23/18 (from the past 24 hour(s))  Urinalysis, Routine w reflex microscopic     Status: Abnormal   Collection Time: 01/23/18  5:08 PM  Result Value Ref Range   Color, Urine STRAW (A) YELLOW   APPearance CLEAR CLEAR   Specific Gravity, Urine 1.006 1.005 - 1.030   pH 6.0 5.0 - 8.0   Glucose, UA NEGATIVE NEGATIVE mg/dL   Hgb urine dipstick NEGATIVE NEGATIVE   Bilirubin Urine NEGATIVE NEGATIVE   Ketones, ur NEGATIVE NEGATIVE mg/dL   Protein, ur NEGATIVE NEGATIVE mg/dL   Nitrite NEGATIVE NEGATIVE   Leukocytes, UA NEGATIVE NEGATIVE  Wet prep, genital     Status: Abnormal   Collection Time: 01/23/18  7:01 PM  Result Value Ref Range   Yeast Wet Prep HPF POC NONE SEEN NONE SEEN   Trich, Wet Prep NONE SEEN NONE SEEN   Clue Cells Wet Prep HPF POC NONE SEEN NONE SEEN   WBC, Wet Prep HPF POC MANY (A) NONE SEEN   Sperm NONE SEEN    MAU Course  Procedures  MDM Labs ordered and reviewed. No evidence of UTI, vaginal infection or PTL. Presentation, clinical findings, and plan discussed with Dr. Dion Body. Stable for discharge home.  Assessment and Plan   1. [redacted] weeks gestation of pregnancy   2. NST (non-stress test) reactive   3. Vaginal discharge during pregnancy in third trimester    Discharge home Follow up in OB office as scheduled PTL precautions  Allergies as of 01/23/2018      Reactions   Sulfa Antibiotics Other (See Comments)   Swelling from sulfa eye drops as a child      Medication List    STOP taking these medications   norgestimate-ethinyl estradiol 0.25-35 MG-MCG tablet Commonly known as:  ORTHO-CYCLEN,SPRINTEC,PREVIFEM   predniSONE 10 MG tablet Commonly known as:  DELTASONE     TAKE these medications   acetaminophen 500 MG tablet Commonly known as:  TYLENOL Take 1,000 mg by mouth daily as needed for pain.   CALCIUM/D3 ADULT GUMMIES PO Take 2 tablets by mouth 2 (two) times daily.   Fluticasone-Salmeterol 100-50 MCG/DOSE Aepb Commonly known as:  ADVAIR Inhale  1 puff into the lungs daily as needed (asthma).   montelukast 10 MG tablet Commonly known as:  SINGULAIR Take 10 mg by mouth at bedtime.   PROAIR HFA 108 (90 Base) MCG/ACT inhaler Generic drug:  albuterol Inhale 2 puffs into the lungs 2 (two) times daily as needed (asthma).   albuterol 108 (90 Base) MCG/ACT inhaler Commonly known as:  PROVENTIL HFA;VENTOLIN HFA Inhale 1-2 puffs into the lungs every 6 (six) hours as needed for wheezing.      Donette Larry, CNM 01/23/2018, 7:49 PM

## 2018-01-24 LAB — GC/CHLAMYDIA PROBE AMP (~~LOC~~) NOT AT ARMC
CHLAMYDIA, DNA PROBE: NEGATIVE
Neisseria Gonorrhea: NEGATIVE

## 2018-02-13 ENCOUNTER — Other Ambulatory Visit: Payer: Self-pay

## 2018-02-13 ENCOUNTER — Encounter (HOSPITAL_COMMUNITY): Payer: Self-pay

## 2018-02-13 ENCOUNTER — Inpatient Hospital Stay (HOSPITAL_COMMUNITY)
Admission: AD | Admit: 2018-02-13 | Discharge: 2018-02-13 | Disposition: A | Discharge status: Home / Self Care | Payer: Medicaid Other | Source: Ambulatory Visit | Attending: Obstetrics & Gynecology | Admitting: Obstetrics & Gynecology

## 2018-02-13 ENCOUNTER — Other Ambulatory Visit (HOSPITAL_COMMUNITY): Payer: Self-pay | Admitting: Obstetrics and Gynecology

## 2018-02-13 ENCOUNTER — Other Ambulatory Visit: Payer: Self-pay | Admitting: Obstetrics and Gynecology

## 2018-02-13 DIAGNOSIS — Z3A33 33 weeks gestation of pregnancy: Secondary | ICD-10-CM | POA: Insufficient documentation

## 2018-02-13 DIAGNOSIS — O133 Gestational [pregnancy-induced] hypertension without significant proteinuria, third trimester: Secondary | ICD-10-CM | POA: Diagnosis not present

## 2018-02-13 DIAGNOSIS — O365910 Maternal care for other known or suspected poor fetal growth, first trimester, not applicable or unspecified: Secondary | ICD-10-CM

## 2018-02-13 DIAGNOSIS — Z79899 Other long term (current) drug therapy: Secondary | ICD-10-CM | POA: Diagnosis not present

## 2018-02-13 DIAGNOSIS — O99513 Diseases of the respiratory system complicating pregnancy, third trimester: Secondary | ICD-10-CM | POA: Insufficient documentation

## 2018-02-13 DIAGNOSIS — J45909 Unspecified asthma, uncomplicated: Secondary | ICD-10-CM | POA: Insufficient documentation

## 2018-02-13 DIAGNOSIS — O163 Unspecified maternal hypertension, third trimester: Secondary | ICD-10-CM | POA: Diagnosis present

## 2018-02-13 DIAGNOSIS — Z882 Allergy status to sulfonamides status: Secondary | ICD-10-CM | POA: Insufficient documentation

## 2018-02-13 DIAGNOSIS — Z3A22 22 weeks gestation of pregnancy: Secondary | ICD-10-CM

## 2018-02-13 HISTORY — DX: Dysplasia of cervix uteri, unspecified: N87.9

## 2018-02-13 HISTORY — DX: Other specified health status: Z78.9

## 2018-02-13 LAB — CBC
HCT: 35.5 % — ABNORMAL LOW (ref 36.0–46.0)
HEMOGLOBIN: 12.2 g/dL (ref 12.0–15.0)
MCH: 31.3 pg (ref 26.0–34.0)
MCHC: 34.4 g/dL (ref 30.0–36.0)
MCV: 91 fL (ref 78.0–100.0)
Platelets: 210 10*3/uL (ref 150–400)
RBC: 3.9 MIL/uL (ref 3.87–5.11)
RDW: 13.4 % (ref 11.5–15.5)
WBC: 7.8 10*3/uL (ref 4.0–10.5)

## 2018-02-13 LAB — COMPREHENSIVE METABOLIC PANEL
ALBUMIN: 3.1 g/dL — AB (ref 3.5–5.0)
ALK PHOS: 97 U/L (ref 38–126)
ALT: 15 U/L (ref 0–44)
ANION GAP: 9 (ref 5–15)
AST: 25 U/L (ref 15–41)
BUN: 12 mg/dL (ref 6–20)
CALCIUM: 8.9 mg/dL (ref 8.9–10.3)
CO2: 21 mmol/L — AB (ref 22–32)
Chloride: 104 mmol/L (ref 98–111)
Creatinine, Ser: 0.88 mg/dL (ref 0.44–1.00)
GFR calc Af Amer: >60 mL/min (ref >60)
GFR calc non Af Amer: >60 mL/min (ref >60)
GLUCOSE: 83 mg/dL (ref 70–99)
Potassium: 4 mmol/L (ref 3.5–5.1)
SODIUM: 134 mmol/L — AB (ref 135–145)
Total Bilirubin: 0.6 mg/dL (ref 0.3–1.2)
Total Protein: 7.1 g/dL (ref 6.5–8.1)

## 2018-02-13 LAB — PROTEIN / CREATININE RATIO, URINE
Creatinine, Urine: 115 mg/dL
Total Protein, Urine: <6 mg/dL

## 2018-02-13 MED ORDER — BETAMETHASONE SOD PHOS & ACET 6 (3-3) MG/ML IJ SUSP
12.0000 mg | INTRAMUSCULAR | Status: DC
  Administered 2018-02-13: 12 mg via INTRAMUSCULAR
  Filled 2018-02-13 (×2): qty 2

## 2018-02-13 MED ORDER — BETAMETHASONE SOD PHOS & ACET 6 (3-3) MG/ML IJ SUSP: 12.0000 mg | INTRAMUSCULAR | Status: DC

## 2018-02-13 MED ORDER — BETAMETHASONE SOD PHOS & ACET 6 (3-3) MG/ML IJ SUSP: 12.0000 mg | Freq: Once | INTRAMUSCULAR | Status: DC

## 2018-02-13 NOTE — MAU Provider Note (Signed)
History     CSN: [id number]  Arrival date and time: 02/13/18 1058   First Provider Initiated Contact with Patient 02/13/18 1134      Chief Complaint  Patient presents with  . Hypertension   Denny L Zill is a 27 y.o. G2P0010 at [redacted]w[redacted]d who presents today with high blood pressure. She was seen in the office today for a routine visit and her B/P was 146/96. She was sent here for evaluation. She denies any VB, LOF or contractions. She reports normal fetal movement. She denies hx of CHTN, and reports BP is usually 100s/70s.  Hypertension  This is a new problem. The current episode started today. The problem is unchanged. Pertinent negatives include no headaches. Associated agents: pregnancy  There are no known risk factors for coronary artery disease. Past treatments include nothing. There are no compliance problems.     Past Medical History:  Diagnosis Date  . Asthma   . Cervical dysplasia   . False positive HIV serology   . Heart murmur     Past Surgical History:  Procedure Laterality Date  . LEEP  2018  . WISDOM TOOTH EXTRACTION Bilateral 2017    Family History  Problem Relation Age of Onset  . Breast cancer Maternal Grandmother   . Diabetes Maternal Grandmother   . Hypertension Maternal Grandmother   . Hyperlipidemia Maternal Grandmother   . Heart disease Maternal Grandmother   . Hyperlipidemia Father   . Hypertension Sister   . Stroke Paternal Uncle   . Cancer Maternal Grandfather   . Diabetes Paternal Grandmother     Social History   Tobacco Use  . Smoking status: Never Smoker  . Smokeless tobacco: Never Used  Substance Use Topics  . Alcohol use: Not Currently  . Drug use: No    Allergies:  Allergies  Allergen Reactions  . Sulfa Antibiotics Other (See Comments)    Swelling from sulfa eye drops as a child    Medications Prior to Admission  Medication Sig Dispense Refill Last Dose  . acetaminophen (TYLENOL) 500 MG tablet Take 1,000 mg by mouth daily  as needed for pain.   1-2 weeks ago  . albuterol (PROAIR HFA) 108 (90 BASE) MCG/ACT inhaler Inhale 2 puffs into the lungs 2 (two) times daily as needed (asthma).   04/27/2013 at Unknown  . albuterol (PROVENTIL HFA;VENTOLIN HFA) 108 (90 BASE) MCG/ACT inhaler Inhale 1-2 puffs into the lungs every 6 (six) hours as needed for wheezing. 1 Inhaler 0   . Calcium-Phosphorus-Vitamin D (CALCIUM/D3 ADULT GUMMIES PO) Take 2 tablets by mouth 2 (two) times daily.   04/26/2013 at Unknown  . Fluticasone-Salmeterol (ADVAIR) 100-50 MCG/DOSE AEPB Inhale 1 puff into the lungs daily as needed (asthma).   4 days ago  . montelukast (SINGULAIR) 10 MG tablet Take 10 mg by mouth at bedtime.   04/24/2013    Review of Systems  Eyes: Positive for visual disturbance.  Gastrointestinal: Negative for abdominal pain.  Genitourinary: Negative for pelvic pain, vaginal bleeding and vaginal discharge.  Neurological: Negative for headaches.   Physical Exam   Blood pressure (!) 150/84, pulse 76, temperature 98.5 F (36.9 C), temperature source Oral, resp. rate 18, height 5\' 5"  (1.651 m), weight 223 lb (101.2 kg).  Physical Exam  Nursing note and vitals reviewed. Constitutional: She is oriented to person, place, and time. She appears well-developed and well-nourished. No distress.  HENT:  Head: Normocephalic.  Cardiovascular: Normal rate.  Respiratory: Effort normal.  GI: Soft. There is no  tenderness. There is no rebound.  Musculoskeletal: She exhibits edema (pedal edema only ).  Neurological: She is alert and oriented to person, place, and time. She has normal reflexes. She exhibits normal muscle tone.  Skin: Skin is warm and dry.  Psychiatric: She has a normal mood and affect.     NST:  Baseline: 130 Variability: moderate Accels: 15x15 Decels: none Toco: none   Results for orders placed or performed during the hospital encounter of 02/13/18 (from the past 24 hour(s))  Protein / creatinine ratio, urine     Status: None    Collection Time: 02/13/18 11:29 AM  Result Value Ref Range   Creatinine, Urine 115.00 mg/dL   Total Protein, Urine <6 mg/dL   Protein Creatinine Ratio        0.00 - 0.15 mg/mg[Cre]  CBC     Status: Abnormal   Collection Time: 02/13/18 11:45 AM  Result Value Ref Range   WBC 7.8 4.0 - 10.5 K/uL   RBC 3.90 3.87 - 5.11 MIL/uL   Hemoglobin 12.2 12.0 - 15.0 g/dL   HCT 16.135.5 (L) 09.636.0 - 04.546.0 %   MCV 91.0 78.0 - 100.0 fL   MCH 31.3 26.0 - 34.0 pg   MCHC 34.4 30.0 - 36.0 g/dL   RDW 40.913.4 81.111.5 - 91.415.5 %   Platelets 210 150 - 400 K/uL  Comprehensive metabolic panel     Status: Abnormal   Collection Time: 02/13/18 11:45 AM  Result Value Ref Range   Sodium 134 (L) 135 - 145 mmol/L   Potassium 4.0 3.5 - 5.1 mmol/L   Chloride 104 98 - 111 mmol/L   CO2 21 (L) 22 - 32 mmol/L   Glucose, Bld 83 70 - 99 mg/dL   BUN 12 6 - 20 mg/dL   Creatinine, Ser 7.820.88 0.44 - 1.00 mg/dL   Calcium 8.9 8.9 - 95.610.3 mg/dL   Total Protein 7.1 6.5 - 8.1 g/dL   Albumin 3.1 (L) 3.5 - 5.0 g/dL   AST 25 15 - 41 U/L   ALT 15 0 - 44 U/L   Alkaline Phosphatase 97 38 - 126 U/L   Total Bilirubin 0.6 0.3 - 1.2 mg/dL   GFR calc non Af Amer >60 >60 mL/min   GFR calc Af Amer >60 >60 mL/min   Anion gap 9 5 - 15    MAU Course  Procedures  MDM  1:41 PM Consult with Dr. Dion BodyVarnado. Discussed blood pressure readings, labs results and NST. She is on her way here to see the patient.   1400: Dr. Dion BodyVarnado on the unit and assumes care of the patient.   Assessment and Plan   1. Gestational hypertension, third trimester    Care turned over Dr. Kandis BanVarnado    Hailee Hollick 02/13/2018, 11:36 AM

## 2018-02-13 NOTE — Discharge Instructions (Signed)
Hypertension During Pregnancy °Hypertension, commonly called high blood pressure, is when the force of blood pumping through your arteries is too strong. Arteries are blood vessels that carry blood from the heart throughout the body. Hypertension during pregnancy can cause problems for you and your baby. Your baby may be born early (prematurely) or may not weigh as much as he or she should at birth. Very bad cases of hypertension during pregnancy can be life-threatening. °Different types of hypertension can occur during pregnancy. These include: °· Chronic hypertension. This happens when: °? You have hypertension before pregnancy and it continues during pregnancy. °? You develop hypertension before you are [redacted] weeks pregnant, and it continues during pregnancy. °· Gestational hypertension. This is hypertension that develops after the 20th week of pregnancy. °· Preeclampsia, also called toxemia of pregnancy. This is a very serious type of hypertension that develops only during pregnancy. It affects the whole body, and it can be very dangerous for you and your baby. ° °Gestational hypertension and preeclampsia usually go away within 6 weeks after your baby is born. Women who have hypertension during pregnancy have a greater chance of developing hypertension later in life or during future pregnancies. °What are the causes? °The exact cause of hypertension is not known. °What increases the risk? °There are certain factors that make it more likely for you to develop hypertension during pregnancy. These include: °· Having hypertension during a previous pregnancy or prior to pregnancy. °· Being overweight. °· Being older than age 40. °· Being pregnant for the first time or being pregnant with more than one baby. °· Becoming pregnant using fertilization methods such as IVF (in vitro fertilization). °· Having diabetes, kidney problems, or systemic lupus erythematosus. °· Having a family history of hypertension. ° °What are the  signs or symptoms? °Chronic hypertension and gestational hypertension rarely cause symptoms. Preeclampsia causes symptoms, which may include: °· Increased protein in your urine. Your health care provider will check for this at every visit before you give birth (prenatal visit). °· Severe headaches. °· Sudden weight gain. °· Swelling of the hands, face, legs, and feet. °· Nausea and vomiting. °· Vision problems, such as blurred or double vision. °· Numbness in the face, arms, legs, and feet. °· Dizziness. °· Slurred speech. °· Sensitivity to bright lights. °· Abdominal pain. °· Convulsions. ° °How is this diagnosed? °You may be diagnosed with hypertension during a routine prenatal exam. At each prenatal visit, you may: °· Have a urine test to check for high amounts of protein in your urine. °· Have your blood pressure checked. A blood pressure reading is recorded as two numbers, such as "120 over 80" (or 120/80). The first ("top") number is called the systolic pressure. It is a measure of the pressure in your arteries when your heart beats. The second ("bottom") number is called the diastolic pressure. It is a measure of the pressure in your arteries as your heart relaxes between beats. Blood pressure is measured in a unit called mm Hg. A normal blood pressure reading is: °? Systolic: below 120. °? Diastolic: below 80. ° °The type of hypertension that you are diagnosed with depends on your test results and when your symptoms developed. °· Chronic hypertension is usually diagnosed before 20 weeks of pregnancy. °· Gestational hypertension is usually diagnosed after 20 weeks of pregnancy. °· Hypertension with high amounts of protein in the urine is diagnosed as preeclampsia. °· Blood pressure measurements that stay above 160 systolic, or above 110 diastolic, are   signs of severe preeclampsia. ° °How is this treated? °Treatment for hypertension during pregnancy varies depending on the type of hypertension you have and how  serious it is. °· If you take medicines called ACE inhibitors to treat chronic hypertension, you may need to switch medicines. ACE inhibitors should not be taken during pregnancy. °· If you have gestational hypertension, you may need to take blood pressure medicine. °· If you are at risk for preeclampsia, your health care provider may recommend that you take a low-dose aspirin every day to prevent high blood pressure during your pregnancy. °· If you have severe preeclampsia, you may need to be hospitalized so you and your baby can be monitored closely. You may also need to take medicine (magnesium sulfate) to prevent seizures and to lower blood pressure. This medicine may be given as an injection or through an IV tube. °· In some cases, if your condition gets worse, you may need to deliver your baby early. ° °Follow these instructions at home: °Eating and drinking °· Drink enough fluid to keep your urine clear or pale yellow. °· Eat a healthy diet that is low in salt (sodium). Do not add salt to your food. Check food labels to see how much sodium a food or beverage contains. °Lifestyle °· Do not use any products that contain nicotine or tobacco, such as cigarettes and e-cigarettes. If you need help quitting, ask your health care provider. °· Do not use alcohol. °· Avoid caffeine. °· Avoid stress as much as possible. Rest and get plenty of sleep. °General instructions °· Take over-the-counter and prescription medicines only as told by your health care provider. °· While lying down, lie on your left side. This keeps pressure off your baby. °· While sitting or lying down, raise (elevate) your feet. Try putting some pillows under your lower legs. °· Exercise regularly. Ask your health care provider what kinds of exercise are best for you. °· Keep all prenatal and follow-up visits as told by your health care provider. This is important. °Contact a health care provider if: °· You have symptoms that your health care  provider told you may require more treatment or monitoring, such as: °? Fever. °? Vomiting. °? Headache. °Get help right away if: °· You have severe abdominal pain or vomiting that does not get better with treatment. °· You suddenly develop swelling in your hands, ankles, or face. °· You gain 4 lbs (1.8 kg) or more in 1 week. °· You develop vaginal bleeding, or you have blood in your urine. °· You do not feel your baby moving as much as usual. °· You have blurred or double vision. °· You have muscle twitching or sudden tightening (spasms). °· You have shortness of breath. °· Your lips or fingernails turn blue. °This information is not intended to replace advice given to you by your health care provider. Make sure you discuss any questions you have with your health care provider. °Document Released: 04/26/2011 Document Revised: 02/26/2016 Document Reviewed: 01/22/2016 °Elsevier Interactive Patient Education © 2018 [employer] Inc. ° °

## 2018-02-13 NOTE — MAU Note (Signed)
Pt presents to MAU from OB office due to elevated blood pressure (140s/90s). Pt denies headache and epigastric pain. She has had recent blurred vision but has not had visual changes in the last week. Pt denies VB and LOF. +FM

## 2018-02-14 ENCOUNTER — Inpatient Hospital Stay (HOSPITAL_COMMUNITY)
Admission: AD | Admit: 2018-02-14 | Discharge: 2018-02-14 | Disposition: A | Discharge status: Home / Self Care | Payer: Medicaid Other | Source: Ambulatory Visit | Attending: Obstetrics and Gynecology | Admitting: Obstetrics and Gynecology

## 2018-02-14 DIAGNOSIS — Z3A35 35 weeks gestation of pregnancy: Secondary | ICD-10-CM | POA: Diagnosis not present

## 2018-02-14 DIAGNOSIS — O26893 Other specified pregnancy related conditions, third trimester: Secondary | ICD-10-CM | POA: Insufficient documentation

## 2018-02-14 MED ORDER — BETAMETHASONE SOD PHOS & ACET 6 (3-3) MG/ML IJ SUSP
12.0000 mg | Freq: Once | INTRAMUSCULAR | Status: AC
  Administered 2018-02-14: 12 mg via INTRAMUSCULAR
  Filled 2018-02-14: qty 2

## 2018-02-14 NOTE — MAU Note (Signed)
Pt presents to MAU for 2nd dose of betamethasone. Pt denies any pain at present.

## 2018-02-19 ENCOUNTER — Other Ambulatory Visit (HOSPITAL_COMMUNITY): Payer: Medicaid Other

## 2018-02-20 ENCOUNTER — Encounter (HOSPITAL_COMMUNITY): Payer: Self-pay

## 2018-02-20 ENCOUNTER — Other Ambulatory Visit (HOSPITAL_COMMUNITY): Payer: Self-pay | Admitting: Obstetrics and Gynecology

## 2018-02-20 ENCOUNTER — Other Ambulatory Visit (HOSPITAL_COMMUNITY): Payer: Self-pay | Admitting: *Deleted

## 2018-02-20 ENCOUNTER — Ambulatory Visit (HOSPITAL_COMMUNITY)
Admission: RE | Admit: 2018-02-20 | Discharge: 2018-02-20 | Disposition: A | Discharge status: Home / Self Care | Payer: Medicaid Other | Source: Ambulatory Visit | Attending: Obstetrics & Gynecology | Admitting: Obstetrics & Gynecology

## 2018-02-20 DIAGNOSIS — O365930 Maternal care for other known or suspected poor fetal growth, third trimester, not applicable or unspecified: Secondary | ICD-10-CM

## 2018-02-20 DIAGNOSIS — Z3A22 22 weeks gestation of pregnancy: Secondary | ICD-10-CM

## 2018-02-20 DIAGNOSIS — O365910 Maternal care for other known or suspected poor fetal growth, first trimester, not applicable or unspecified: Secondary | ICD-10-CM

## 2018-02-20 DIAGNOSIS — Z363 Encounter for antenatal screening for malformations: Secondary | ICD-10-CM | POA: Diagnosis not present

## 2018-02-20 DIAGNOSIS — O133 Gestational [pregnancy-induced] hypertension without significant proteinuria, third trimester: Secondary | ICD-10-CM | POA: Diagnosis not present

## 2018-02-20 DIAGNOSIS — Z3A34 34 weeks gestation of pregnancy: Secondary | ICD-10-CM | POA: Insufficient documentation

## 2018-02-20 DIAGNOSIS — O26843 Uterine size-date discrepancy, third trimester: Secondary | ICD-10-CM | POA: Diagnosis not present

## 2018-02-25 ENCOUNTER — Inpatient Hospital Stay (HOSPITAL_COMMUNITY)
Admission: AD | Admit: 2018-02-25 | Discharge: 2018-02-25 | Disposition: A | Discharge status: Home / Self Care | Payer: Medicaid Other | Source: Ambulatory Visit | Attending: Obstetrics and Gynecology | Admitting: Obstetrics and Gynecology

## 2018-02-25 ENCOUNTER — Encounter (HOSPITAL_COMMUNITY): Payer: Self-pay | Admitting: *Deleted

## 2018-02-25 ENCOUNTER — Other Ambulatory Visit: Payer: Self-pay

## 2018-02-25 ENCOUNTER — Inpatient Hospital Stay (HOSPITAL_BASED_OUTPATIENT_CLINIC_OR_DEPARTMENT_OTHER): Payer: Medicaid Other

## 2018-02-25 DIAGNOSIS — O368130 Decreased fetal movements, third trimester, not applicable or unspecified: Secondary | ICD-10-CM | POA: Insufficient documentation

## 2018-02-25 DIAGNOSIS — O288 Other abnormal findings on antenatal screening of mother: Secondary | ICD-10-CM

## 2018-02-25 DIAGNOSIS — O365130 Maternal care for known or suspected placental insufficiency, third trimester, not applicable or unspecified: Secondary | ICD-10-CM | POA: Diagnosis not present

## 2018-02-25 DIAGNOSIS — R030 Elevated blood-pressure reading, without diagnosis of hypertension: Secondary | ICD-10-CM | POA: Diagnosis present

## 2018-02-25 DIAGNOSIS — Z3A35 35 weeks gestation of pregnancy: Secondary | ICD-10-CM | POA: Diagnosis not present

## 2018-02-25 DIAGNOSIS — O133 Gestational [pregnancy-induced] hypertension without significant proteinuria, third trimester: Secondary | ICD-10-CM | POA: Diagnosis not present

## 2018-02-25 DIAGNOSIS — O365930 Maternal care for other known or suspected poor fetal growth, third trimester, not applicable or unspecified: Secondary | ICD-10-CM | POA: Diagnosis not present

## 2018-02-25 DIAGNOSIS — O289 Unspecified abnormal findings on antenatal screening of mother: Secondary | ICD-10-CM | POA: Diagnosis not present

## 2018-02-25 HISTORY — DX: Papillomavirus as the cause of diseases classified elsewhere: B97.7

## 2018-02-25 LAB — CBC WITH DIFFERENTIAL/PLATELET
BASOS PCT: 0 %
Basophils Absolute: 0 10*3/uL (ref 0.0–0.1)
EOS ABS: 0 10*3/uL (ref 0.0–0.7)
EOS PCT: 0 %
HCT: 36.7 % (ref 36.0–46.0)
HEMOGLOBIN: 12.5 g/dL (ref 12.0–15.0)
Lymphocytes Relative: 18 %
Lymphs Abs: 1.6 10*3/uL (ref 0.7–4.0)
MCH: 31.1 pg (ref 26.0–34.0)
MCHC: 34.1 g/dL (ref 30.0–36.0)
MCV: 91.3 fL (ref 78.0–100.0)
Monocytes Absolute: 0.5 10*3/uL (ref 0.1–1.0)
Monocytes Relative: 6 %
NEUTROS PCT: 76 %
Neutro Abs: 6.6 10*3/uL (ref 1.7–7.7)
PLATELETS: 195 10*3/uL (ref 150–400)
RBC: 4.02 MIL/uL (ref 3.87–5.11)
RDW: 13.7 % (ref 11.5–15.5)
WBC: 8.8 10*3/uL (ref 4.0–10.5)

## 2018-02-25 LAB — COMPREHENSIVE METABOLIC PANEL
ALK PHOS: 102 U/L (ref 38–126)
ALT: 15 U/L (ref 0–44)
AST: 24 U/L (ref 15–41)
Albumin: 3.1 g/dL — ABNORMAL LOW (ref 3.5–5.0)
Anion gap: 8 (ref 5–15)
BUN: 20 mg/dL (ref 6–20)
CALCIUM: 9.5 mg/dL (ref 8.9–10.3)
CO2: 20 mmol/L — ABNORMAL LOW (ref 22–32)
CREATININE: 0.53 mg/dL (ref 0.44–1.00)
Chloride: 103 mmol/L (ref 98–111)
Glucose, Bld: 82 mg/dL (ref 70–99)
Potassium: 4.2 mmol/L (ref 3.5–5.1)
Sodium: 131 mmol/L — ABNORMAL LOW (ref 135–145)
Total Bilirubin: 0.5 mg/dL (ref 0.3–1.2)
Total Protein: 7 g/dL (ref 6.5–8.1)

## 2018-02-25 LAB — PROTEIN / CREATININE RATIO, URINE
CREATININE, URINE: 83 mg/dL
Total Protein, Urine: <6 mg/dL

## 2018-02-25 LAB — URIC ACID: URIC ACID, SERUM: 5.1 mg/dL (ref 2.5–7.1)

## 2018-02-25 NOTE — MAU Note (Signed)
Checked BP at work last night 166/85, no headache, no blurry vision,no epigastric pain  Decreased fetal movement, has felt some but not as much as normal  Reports upper back pain, aching  No vaginal bleeding, no LOF, no ctx

## 2018-02-25 NOTE — Discharge Instructions (Signed)
Hypertension During Pregnancy Hypertension is also called high blood pressure. High blood pressure means that the force of your blood moving in your body is too strong. When you are pregnant, this condition should be watched carefully. It can cause problems for you and your baby. Follow these instructions at home: Eating and drinking  Drink enough fluid to keep your pee (urine) clear or pale yellow.  Eat healthy foods that are low in salt (sodium). ? Do not add salt to your food. ? Check labels on foods and drinks to see much salt is in them. Look on the label where you see "Sodium." Lifestyle  Do not use any products that contain nicotine or tobacco, such as cigarettes and e-cigarettes. If you need help quitting, ask your doctor.  Do not use alcohol.  Avoid caffeine.  Avoid stress. Rest and get plenty of sleep. General instructions  Take over-the-counter and prescription medicines only as told by your doctor.  While lying down, lie on your left side. This keeps pressure off your baby.  While sitting or lying down, raise (elevate) your feet. Try putting some pillows under your lower legs.  Exercise regularly. Ask your doctor what kinds of exercise are best for you.  Keep all prenatal and follow-up visits as told by your doctor. This is important. Contact a doctor if:  You have symptoms that your doctor told you to watch for, such as: ? Fever. ? Throwing up (vomiting). ? Headache. Get help right away if:  You have very bad pain in your belly (abdomen).  You are throwing up, and this does not get better with treatment.  You suddenly get swelling in your hands, ankles, or face.  You gain 4 lb (1.8 kg) or more in 1 week.  You get bleeding from your vagina.  You have blood in your pee.  You do not feel your baby moving as much as normal.  You have a change in vision.  You have muscle twitching or sudden tightening (spasms).  You have trouble breathing.  Your lips  or fingernails turn blue. This information is not intended to replace advice given to you by your health care provider. Make sure you discuss any questions you have with your health care provider. Document Released: 09/10/2010 Document Revised: 04/19/2016 Document Reviewed: 04/19/2016 Elsevier Interactive Patient Education  2018 [employer].   Fetal Movement Counts Patient Name: ________________________________________________ Patient Due Date: ____________________ What is a fetal movement count? A fetal movement count is the number of times that you feel your baby move during a certain amount of time. This may also be called a fetal kick count. A fetal movement count is recommended for every pregnant woman. You may be asked to start counting fetal movements as early as week 28 of your pregnancy. Pay attention to when your baby is most active. You may notice your baby's sleep and wake cycles. You may also notice things that make your baby move more. You should do a fetal movement count:  When your baby is normally most active.  At the same time each day.  A good time to count movements is while you are resting, after having something to eat and drink. How do I count fetal movements? 1. Find a quiet, comfortable area. Sit, or lie down on your side. 2. Write down the date, the start time and stop time, and the number of movements that you felt between those two times. Take this information with you to your health care visits.  3. For 2 hours, count kicks, flutters, swishes, rolls, and jabs. You should feel at least 10 movements during 2 hours. 4. You may stop counting after you have felt 10 movements. 5. If you do not feel 10 movements in 2 hours, have something to eat and drink. Then, keep resting and counting for 1 hour. If you feel at least 4 movements during that hour, you may stop counting. Contact a health care provider if:  You feel fewer than 4 movements in 2 hours.  Your baby is  not moving like he or she usually does. Date: ____________ Start time: ____________ Stop time: ____________ Movements: ____________ Date: ____________ Start time: ____________ Stop time: ____________ Movements: ____________ Date: ____________ Start time: ____________ Stop time: ____________ Movements: ____________ Date: ____________ Start time: ____________ Stop time: ____________ Movements: ____________ Date: ____________ Start time: ____________ Stop time: ____________ Movements: ____________ Date: ____________ Start time: ____________ Stop time: ____________ Movements: ____________ Date: ____________ Start time: ____________ Stop time: ____________ Movements: ____________ Date: ____________ Start time: ____________ Stop time: ____________ Movements: ____________ Date: ____________ Start time: ____________ Stop time: ____________ Movements: ____________ This information is not intended to replace advice given to you by your health care provider. Make sure you discuss any questions you have with your health care provider. Document Released: 09/07/2006 Document Revised: 04/06/2016 Document Reviewed: 09/17/2015 Elsevier Interactive Patient Education  2018 [employer]  Preeclampsia and Eclampsia Preeclampsia is a serious condition that develops only during pregnancy. It is also called toxemia of pregnancy. This condition causes high blood pressure along with other symptoms, such as swelling and headaches. These symptoms may develop as the condition gets worse. Preeclampsia may occur at 20 weeks of pregnancy or later. Diagnosing and treating preeclampsia early is very important. If not treated early, it can cause serious problems for you and your baby. One problem it can lead to is eclampsia, which is a condition that causes muscle jerking or shaking (convulsions or seizures) in the mother. Delivering your baby is the best treatment for preeclampsia or eclampsia. Preeclampsia and eclampsia symptoms  usually go away after your baby is born. What are the causes? The cause of preeclampsia is not known. What increases the risk? The following risk factors make you more likely to develop preeclampsia:  Being pregnant for the first time.  Having had preeclampsia during a past pregnancy.  Having a family history of preeclampsia.  Having high blood pressure.  Being pregnant with twins or triplets.  Being 4335 or older.  Being African-American.  Having kidney disease or diabetes.  Having medical conditions such as lupus or blood diseases.  Being very overweight (obese).  What are the signs or symptoms? The earliest signs of preeclampsia are:  High blood pressure.  Increased protein in your urine. Your health care provider will check for this at every visit before you give birth (prenatal visit).  Other symptoms that may develop as the condition gets worse include:  Severe headaches.  Sudden weight gain.  Swelling of the hands, face, legs, and feet.  Nausea and vomiting.  Vision problems, such as blurred or double vision.  Numbness in the face, arms, legs, and feet.  Urinating less than usual.  Dizziness.  Slurred speech.  Abdominal pain, especially upper abdominal pain.  Convulsions or seizures.  Symptoms generally go away after giving birth. How is this diagnosed? There are no screening tests for preeclampsia. Your health care provider will ask you about symptoms and check for signs of preeclampsia during your prenatal visits. You  may also have tests that include:  Urine tests.  Blood tests.  Checking your blood pressure.  Monitoring your babys heart rate.  Ultrasound.  How is this treated? You and your health care provider will determine the treatment approach that is best for you. Treatment may include:  Having more frequent prenatal exams to check for signs of preeclampsia, if you have an increased risk for preeclampsia.  Bed rest.  Reducing  how much salt (sodium) you eat.  Medicine to lower your blood pressure.  Staying in the hospital, if your condition is severe. There, treatment will focus on controlling your blood pressure and the amount of fluids in your body (fluid retention).  You may need to take medicine (magnesium sulfate) to prevent seizures. This medicine may be given as an injection or through an IV tube.  Delivering your baby early, if your condition gets worse. You may have your labor started with medicine (induced), or you may have a cesarean delivery.  Follow these instructions at home: Eating and drinking   Drink enough fluid to keep your urine clear or pale yellow.  Eat a healthy diet that is low in sodium. Do not add salt to your food. Check nutrition labels to see how much sodium a food or beverage contains.  Avoid caffeine. Lifestyle  Do not use any products that contain nicotine or tobacco, such as cigarettes and e-cigarettes. If you need help quitting, ask your health care provider.  Do not use alcohol or drugs.  Avoid stress as much as possible. Rest and get plenty of sleep. General instructions  Take over-the-counter and prescription medicines only as told by your health care provider.  When lying down, lie on your side. This keeps pressure off of your baby.  When sitting or lying down, raise (elevate) your feet. Try putting some pillows underneath your lower legs.  Exercise regularly. Ask your health care provider what kinds of exercise are best for you.  Keep all follow-up and prenatal visits as told by your health care provider. This is important. How is this prevented? To prevent preeclampsia or eclampsia from developing during another pregnancy:  Get proper medical care during pregnancy. Your health care provider may be able to prevent preeclampsia or diagnose and treat it early.  Your health care provider may have you take a low-dose aspirin or a calcium supplement during your  next pregnancy.  You may have tests of your blood pressure and kidney function after giving birth.  Maintain a healthy weight. Ask your health care provider for help managing weight gain during pregnancy.  Work with your health care provider to manage any long-term (chronic) health conditions you have, such as diabetes or kidney problems.  Contact a health care provider if:  You gain more weight than expected.  You have headaches.  You have nausea or vomiting.  You have abdominal pain.  You feel dizzy or light-headed. Get help right away if:  You develop sudden or severe swelling anywhere in your body. This usually happens in the legs.  You gain 5 lbs (2.3 kg) or more during one week.  You have severe: ? Abdominal pain. ? Headaches. ? Dizziness. ? Vision problems. ? Confusion. ? Nausea or vomiting.  You have a seizure.  You have trouble moving any part of your body.  You develop numbness in any part of your body.  You have trouble speaking.  You have any abnormal bleeding.  You pass out. This information is not intended to replace  advice given to you by your health care provider. Make sure you discuss any questions you have with your health care provider. Document Released: 08/05/2000 Document Revised: 04/05/2016 Document Reviewed: 03/14/2016 Elsevier Interactive Patient Education  [employer].

## 2018-02-25 NOTE — MAU Provider Note (Addendum)
History     CSN: [id number]  Arrival date and time: 02/25/18 1520   None     Chief Complaint  Patient presents with  . Hypertension  . Decreased Fetal Movement   Patient had elevated blood pressure 166/85 at around 0200 while she was at work. She called and was told to monitor it at home. When rechecked it was still elevated, systolic in 150s. Patient also noted decreased fetal movement. Baby is still moving but she says it is less than is normal for her baby. Patient denies epigastric pain, headache, or vision changes. She denies contractions. Patient's history is complicated by IUGR and placental insufficiency being followed by MFM and MDs at [facility]. Baby is <10%ile.   From U/S on 02/20/18: Impression  Patient is here for a second opinion because of suspected  fetal growth restriction. She has gestational hypertension.  Protein/creatinine ratio is not increased. She reports having  had low risk for fetal aneuploidies on screening. She does not  have any chronic medical conditions.  On ultrasound, the estimated fetal weight is at less than the  10th percentile. Head circumference measurement is at  between -1 and -2 SD (not consistent with microcephaly) and  abdominal circumference measurement is at less than the  3rd percentile.  Amniotic fluid is normal.  BPP 8/8.  Umbilical artery Doppler showed normal diastolic flow.  BP at our office: 146/93, 114/72, 141/87 mm Hg.  I counseled the patient on the finding of fetal growth  restriction. Most-likely cause is placental insufficiency from  gestational hypertension.  She does not have severe symptoms of preeclampsia.  Timing of delivery: Given that she has gestational  hypertension and fetal growth restriction, I recommend  delivery between 36 and 37 weeks.  I discussed the findings and recommendations with Dr.  Dion BodyVarnado.   OB History    Gravida  2   Para      Term      Preterm      AB  1   Living        SAB      TAB  1   Ectopic      Multiple      Live Births              Past Medical History:  Diagnosis Date  . Asthma   . Cervical dysplasia   . False positive HIV serology   . Heart murmur   . HPV (human papilloma virus) infection 2018    Past Surgical History:  Procedure Laterality Date  . LEEP  2018  . WISDOM TOOTH EXTRACTION Bilateral 2017    Family History  Problem Relation Age of Onset  . Breast cancer Maternal Grandmother   . Diabetes Maternal Grandmother   . Hypertension Maternal Grandmother   . Hyperlipidemia Maternal Grandmother   . Heart disease Maternal Grandmother   . Hyperlipidemia Father   . Hypertension Sister   . Stroke Paternal Uncle   . Cancer Maternal Grandfather   . Diabetes Paternal Grandmother     Social History   Tobacco Use  . Smoking status: Never Smoker  . Smokeless tobacco: Never Used  Substance Use Topics  . Alcohol use: Not Currently  . Drug use: No    Allergies:  Allergies  Allergen Reactions  . Sulfa Antibiotics Other (See Comments)    Swelling from sulfa eye drops as a child    Medications Prior to Admission  Medication Sig Dispense Refill Last Dose  .  albuterol (PROVENTIL HFA;VENTOLIN HFA) 108 (90 BASE) MCG/ACT inhaler Inhale 1-2 puffs into the lungs every 6 (six) hours as needed for wheezing. 1 Inhaler 0 Past Week at Unknown time  . betamethasone acetate-betamethasone sodium phosphate (CELESTONE) 6 (3-3) MG/ML injection Inject 2 mLs (12 mg total) into the muscle every 24 hours x 2 doses. (Patient not taking: Reported on 02/20/2018) 5 mL  Not Taking  . levocetirizine (XYZAL) 5 MG tablet Take 5 mg by mouth every evening.   02/23/18  . montelukast (SINGULAIR) 10 MG tablet Take 10 mg by mouth at bedtime.   02/23/18  . Prenatal Vit-Fe Fumarate-FA (PRENATAL MULTIVITAMIN) TABS tablet Take 1 tablet by mouth daily at 12 noon.   02/618    Review of Systems  Eyes: Negative for visual disturbance.  Gastrointestinal:  Negative for abdominal pain.  Musculoskeletal: Positive for back pain.  Neurological: Negative for headaches.  All other systems reviewed and are negative.  Physical Exam   Vitals:   02/25/18 1530 02/25/18 1554  BP: (!) 140/92 (!) 144/80  Pulse: 77 76  Resp: 18   Temp: 97.8 F (36.6 C)   TempSrc: Oral   Weight: 101.9 kg (224 lb 12 oz)   Height: 5\' 5"  (1.651 m)     Physical Exam  Nursing note and vitals reviewed. Constitutional: She is oriented to person, place, and time. She appears well-developed and well-nourished.  HENT:  Head: Normocephalic.  Eyes: Pupils are equal, round, and reactive to light.  Cardiovascular: Normal rate, regular rhythm and normal heart sounds.  Respiratory: Effort normal and breath sounds normal.  Genitourinary: Uterus normal.  Musculoskeletal: Normal range of motion.  Neurological: She is alert and oriented to person, place, and time.  Skin: Skin is warm and dry.  Psychiatric: She has a normal mood and affect. Her behavior is normal. Judgment and thought content normal.    MAU Course  Procedures PIH labs BPP NST  MDM Patient with two separate presenting problems: decreased fetal movement and elevated Bps. PIH vs. Preeclampsia determination by [facility] labs. Nonreactive NST after on monitor, BPP to assess fetal wellbeing. Was 8/8 today, this is reassuring regarding fetal status. PIH labs negative for preeclampsia. Patient has stable blood pressures in MAU (140s/80s-90s). Consulted Dr. Normand Sloop, will discharge patient with blood pressure precautions. Patient to follow up with MFM at scheduled appointment for repeat BPP on 02/28/18. Will recommend patient follow-up at [facility] as well early next week.   Assessment and Plan  27 y.o. G2P1 at [redacted]w[redacted]d Known placental insufficiency and IUGR (<10%ile) Pregnancy induced hypertension BPP 8/8 Blood pressure and preeclampsia precautions reviewed and information given Patient to [facility] up with [facility]  MDs and MFM next week   Megan Norris 02/25/2018, 4:34 PM

## 2018-02-26 ENCOUNTER — Encounter (HOSPITAL_COMMUNITY): Payer: Self-pay

## 2018-02-26 ENCOUNTER — Inpatient Hospital Stay (HOSPITAL_COMMUNITY)
Admission: AD | Admit: 2018-02-26 | Discharge: 2018-03-04 | DRG: 807 | Disposition: A | Discharge status: Home / Self Care | Payer: Medicaid Other | Source: Ambulatory Visit | Attending: Obstetrics and Gynecology | Admitting: Obstetrics and Gynecology

## 2018-02-26 DIAGNOSIS — O368390 Maternal care for abnormalities of the fetal heart rate or rhythm, unspecified trimester, not applicable or unspecified: Secondary | ICD-10-CM | POA: Diagnosis present

## 2018-02-26 DIAGNOSIS — O134 Gestational [pregnancy-induced] hypertension without significant proteinuria, complicating childbirth: Secondary | ICD-10-CM | POA: Diagnosis present

## 2018-02-26 DIAGNOSIS — O365930 Maternal care for other known or suspected poor fetal growth, third trimester, not applicable or unspecified: Secondary | ICD-10-CM | POA: Diagnosis present

## 2018-02-26 DIAGNOSIS — Z3A35 35 weeks gestation of pregnancy: Secondary | ICD-10-CM

## 2018-02-26 DIAGNOSIS — O368130 Decreased fetal movements, third trimester, not applicable or unspecified: Secondary | ICD-10-CM | POA: Diagnosis present

## 2018-02-26 DIAGNOSIS — Z803 Family history of malignant neoplasm of breast: Secondary | ICD-10-CM

## 2018-02-26 DIAGNOSIS — Z8249 Family history of ischemic heart disease and other diseases of the circulatory system: Secondary | ICD-10-CM

## 2018-02-26 DIAGNOSIS — O9952 Diseases of the respiratory system complicating childbirth: Secondary | ICD-10-CM | POA: Diagnosis present

## 2018-02-26 DIAGNOSIS — Z87410 Personal history of cervical dysplasia: Secondary | ICD-10-CM

## 2018-02-26 DIAGNOSIS — J45909 Unspecified asthma, uncomplicated: Secondary | ICD-10-CM | POA: Diagnosis present

## 2018-02-26 DIAGNOSIS — O1404 Mild to moderate pre-eclampsia, complicating childbirth: Secondary | ICD-10-CM | POA: Diagnosis present

## 2018-02-26 LAB — COMPREHENSIVE METABOLIC PANEL
ALBUMIN: 3 g/dL — AB (ref 3.5–5.0)
ALK PHOS: 98 U/L (ref 38–126)
ALT: 14 U/L (ref 0–44)
ANION GAP: 9 (ref 5–15)
AST: 23 U/L (ref 15–41)
BILIRUBIN TOTAL: 0.7 mg/dL (ref 0.3–1.2)
BUN: 16 mg/dL (ref 6–20)
CALCIUM: 9.2 mg/dL (ref 8.9–10.3)
CO2: 19 mmol/L — ABNORMAL LOW (ref 22–32)
Chloride: 105 mmol/L (ref 98–111)
Creatinine, Ser: 0.51 mg/dL (ref 0.44–1.00)
GLUCOSE: 90 mg/dL (ref 70–99)
POTASSIUM: 4.1 mmol/L (ref 3.5–5.1)
Sodium: 133 mmol/L — ABNORMAL LOW (ref 135–145)
Total Protein: 6.7 g/dL (ref 6.5–8.1)

## 2018-02-26 LAB — CBC
HCT: 36 % (ref 36.0–46.0)
HEMOGLOBIN: 12.2 g/dL (ref 12.0–15.0)
MCH: 30.9 pg (ref 26.0–34.0)
MCHC: 33.9 g/dL (ref 30.0–36.0)
MCV: 91.1 fL (ref 78.0–100.0)
Platelets: 196 10*3/uL (ref 150–400)
RBC: 3.95 MIL/uL (ref 3.87–5.11)
RDW: 13.6 % (ref 11.5–15.5)
WBC: 9.2 10*3/uL (ref 4.0–10.5)

## 2018-02-26 LAB — TYPE AND SCREEN
ABO/RH(D): A POS
ANTIBODY SCREEN: NEGATIVE

## 2018-02-26 LAB — ABO/RH: ABO/RH(D): A POS

## 2018-02-26 MED ORDER — HYDRALAZINE HCL 20 MG/ML IJ SOLN
5.0000 mg | INTRAMUSCULAR | Status: DC | PRN
Start: 2018-02-26 — End: 2018-02-27

## 2018-02-26 MED ORDER — ZOLPIDEM TARTRATE 5 MG PO TABS: 5.0000 mg | ORAL_TABLET | Freq: Every evening | ORAL | Status: DC | PRN

## 2018-02-26 MED ORDER — MONTELUKAST SODIUM 10 MG PO TABS
10.0000 mg | ORAL_TABLET | Freq: Every day | ORAL | Status: DC
  Administered 2018-02-27 – 2018-03-03 (×3): 10 mg via ORAL
  Filled 2018-02-26 (×9): qty 1

## 2018-02-26 MED ORDER — LEVOCETIRIZINE DIHYDROCHLORIDE 5 MG PO TABS: 5.0000 mg | ORAL_TABLET | Freq: Every evening | ORAL | Status: DC

## 2018-02-26 MED ORDER — FAMOTIDINE 20 MG PO TABS: 20.0000 mg | ORAL_TABLET | Freq: Every day | ORAL | Status: DC | PRN

## 2018-02-26 MED ORDER — LACTATED RINGERS IV BOLUS
1000.0000 mL | Freq: Once | INTRAVENOUS | Status: AC
  Administered 2018-02-26: 1000 mL via INTRAVENOUS

## 2018-02-26 MED ORDER — DOCUSATE SODIUM 100 MG PO CAPS: 100.0000 mg | ORAL_CAPSULE | Freq: Every day | ORAL | Status: DC

## 2018-02-26 MED ORDER — PRENATAL MULTIVITAMIN CH: 1.0000 | ORAL_TABLET | Freq: Every day | ORAL | Status: DC

## 2018-02-26 MED ORDER — CALCIUM CARBONATE ANTACID 500 MG PO CHEW: 2.0000 | CHEWABLE_TABLET | ORAL | Status: DC | PRN

## 2018-02-26 MED ORDER — LORATADINE 10 MG PO TABS
10.0000 mg | ORAL_TABLET | Freq: Every day | ORAL | Status: DC
  Filled 2018-02-26 (×2): qty 1

## 2018-02-26 MED ORDER — ALBUTEROL SULFATE (2.5 MG/3ML) 0.083% IN NEBU: 3.0000 mL | INHALATION_SOLUTION | Freq: Four times a day (QID) | RESPIRATORY_TRACT | Status: DC | PRN

## 2018-02-26 MED ORDER — LACTATED RINGERS IV SOLN
INTRAVENOUS | Status: DC
  Administered 2018-02-26: 18:00:00 via INTRAVENOUS

## 2018-02-26 MED ORDER — LABETALOL HCL 5 MG/ML IV SOLN: 20.0000 mg | INTRAVENOUS | Status: DC | PRN

## 2018-02-26 MED ORDER — ACETAMINOPHEN 325 MG PO TABS: 650.0000 mg | ORAL_TABLET | ORAL | Status: DC | PRN

## 2018-02-26 MED ORDER — ONDANSETRON HCL 4 MG PO TABS
4.0000 mg | ORAL_TABLET | Freq: Three times a day (TID) | ORAL | Status: DC | PRN
  Administered 2018-02-28: 4 mg via ORAL
  Filled 2018-02-26: qty 1

## 2018-02-26 NOTE — Progress Notes (Signed)
Pt has had other decelerations since admission and s/p IVF.  D/w Dr. Judeth CornfieldShankar.  Agrees to proceed with delivery.  Pt offered trial of labor, discussed risks and benefits.  Pt would like a vaginal delivery but would like to do what is best for the baby.  Pt is having significant anxiety and cesarean section would be the most definitive and quickest route for delivery.  Pt states she would like a trial of labor but will do a cesarean section at the first sign of distress.  All questions answered.  Plan reviewed with FOB remotely.   [facility] for Foley Bulb placement.

## 2018-02-26 NOTE — H&P (Signed)
Megan Norris is a 27 y.o. female G2 P0010 @ 35 4/7 weeks with h/o Gestational HTN and IUGR presenting for observation due to nonreassuring fetal testing. Pt was seen yesterday in MAU due to decreased fetal movement.  BPP 8/10, tracing was nonreactive.  Pt presented today for routine antenatal testing due to Gest HTN and tracing again was nonreactive.  BPP was now 6/10.  No decelerations were noted in the office. Pt was admitted for observation. While on unit within 1 hour of fetal monitoring, subtle late decel x 1 and prolonged decel x 3 minutes noted.  D/w MFM Dr. Ezzard Standing whom pt saw 3 days ago with normal dopplers recommended watching patient closely and to proceed with delivery if pt as another deceleration. Pt has been under survillence for Gest HTN.  Labs have been stable.  PCR 0.102 on 02/20/18. PNC with [facility] Dion Body) since 9 weeks. 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  OB History    Gravida  2   Para      Term      Preterm      AB  1   Living        SAB      TAB  1   Ectopic      Multiple      Live Births             Past Medical History:  Diagnosis Date  . Asthma   . Cervical dysplasia   . False positive HIV serology   . Heart murmur   . HPV (human papilloma virus) infection 2018   Past Surgical History:  Procedure Laterality Date  . LEEP  2018  . WISDOM TOOTH EXTRACTION Bilateral 2017   Family History: family history includes Breast cancer in her maternal grandmother; Cancer in her maternal grandfather; Diabetes in her maternal grandmother and paternal grandmother; Heart disease in her maternal grandmother; Hyperlipidemia in her father and maternal grandmother; Hypertension in her maternal grandmother and sister; Stroke in her paternal uncle. Social History:  reports that she has never smoked. She has never used smokeless tobacco. She reports  that she drank alcohol. She reports that she does not use drugs.     Maternal Diabetes: No Genetic Screening: Normal Maternal Ultrasounds/Referrals: Abnormal:  Findings:   IUGR Fetal Ultrasounds or other Referrals:  Referred to Materal Fetal Medicine  Maternal Substance Abuse:  No Significant Maternal Medications:  None Significant Maternal Lab Results:  None Other Comments:  See H&P  Review of Systems  Constitutional: Negative for chills and fever.  Eyes: Negative.   Respiratory: Negative for shortness of breath.   Cardiovascular: Negative for chest pain.  Gastrointestinal: Negative for abdominal pain.  Neurological: Negative for headaches.   Maternal Medical History:  Fetal activity: Perceived fetal activity is decreased.    Prenatal complications: PIH and IUGR.   Prenatal Complications - Diabetes: none.    Dilation: 1 Effacement (%): 30 Exam by:: Kodey Xue, MD Blood pressure (!) 137/98, pulse 86, temperature 98.6 F (37 C), temperature source Oral, resp. rate 18, height 5\' 5"  (1.651 m), weight 101.6 kg (224 lb), last menstrual period 06/22/2017, SpO2 99 %. Maternal Exam:  Uterine Assessment: Contraction frequency is rare.   Abdomen: Patient reports no abdominal tenderness. Estimated fetal weight is 3 lbs 13 oz 02/13/2018.   Fetal presentation: vertex  Introitus: Normal vulva. Pelvis: adequate for  delivery.   Cervix: Cervix evaluated by digital exam.   1/30/-2  Fetal Exam Fetal Monitor Review: Mode: fetoscope.   Variability: minimal (<5 bpm).   Pattern: late decelerations, prolonged decelerations and accelerations present.    Fetal State Assessment: Category II - tracings are indeterminate.     Physical Exam  Constitutional: She is oriented to person, place, and time. She appears well-developed and well-nourished.  HENT:  Head: Normocephalic and atraumatic.  Eyes: EOM are normal.  Neck: Normal range of motion.  Respiratory: Effort normal. No respiratory  distress.  GI: There is no tenderness.  Musculoskeletal: Normal range of motion. She exhibits no tenderness.  SCDs in place  Neurological: She is alert and oriented to person, place, and time. She has normal reflexes.  Skin: Skin is warm and dry.  Psychiatric:  Pt is anxious.    Prenatal labs: ABO, Rh: --/--/A POS (07/08 1724) Antibody: PENDING (07/08 1724) Rubella:   RPR:    HBsAg:    HIV:    GBS:   Pending  Assessment/Plan: IUP @ 35 4/7 weeks Gest HTN, IUGR now with decreased fetal movement and non reassuring fetal testing.  Continuous monitoring.  Proceed with delivery if further deceleration noted.  If reassuring overnight, repeat BPP in am. Pt counseled on vaginal delivery and risk of fetal intolerance to labor.   Would place foley bulb without Pitocin to start induction. Gest HTN.  Start 24 hour urine.  No s/sxs of Preeclampsia.  Labs normal and stable. PCR for GBS if proceed with delivery. All questions answered.  Plan d/w pt and family.   Geryl Rankinsvelyn Artina Minella 02/26/2018, 6:32 PM

## 2018-02-26 NOTE — Progress Notes (Signed)
Dr. Dion BodyVarnado gave orders to send patient down to Labor and delivery due to frequent decelerations. Gave report to Lurena Joinerebecca, Charity fundraiserN. Am awaiting a room number and the permission to bring her down.

## 2018-02-27 ENCOUNTER — Other Ambulatory Visit (HOSPITAL_COMMUNITY): Payer: Medicaid Other

## 2018-02-27 ENCOUNTER — Encounter (HOSPITAL_COMMUNITY): Payer: Self-pay

## 2018-02-27 DIAGNOSIS — O365930 Maternal care for other known or suspected poor fetal growth, third trimester, not applicable or unspecified: Secondary | ICD-10-CM | POA: Diagnosis present

## 2018-02-27 DIAGNOSIS — O9952 Diseases of the respiratory system complicating childbirth: Secondary | ICD-10-CM | POA: Diagnosis present

## 2018-02-27 DIAGNOSIS — Z3A35 35 weeks gestation of pregnancy: Secondary | ICD-10-CM | POA: Diagnosis not present

## 2018-02-27 DIAGNOSIS — Z803 Family history of malignant neoplasm of breast: Secondary | ICD-10-CM | POA: Diagnosis not present

## 2018-02-27 DIAGNOSIS — Z87410 Personal history of cervical dysplasia: Secondary | ICD-10-CM | POA: Diagnosis not present

## 2018-02-27 DIAGNOSIS — O368130 Decreased fetal movements, third trimester, not applicable or unspecified: Secondary | ICD-10-CM | POA: Diagnosis present

## 2018-02-27 DIAGNOSIS — O1404 Mild to moderate pre-eclampsia, complicating childbirth: Secondary | ICD-10-CM | POA: Diagnosis present

## 2018-02-27 DIAGNOSIS — Z8249 Family history of ischemic heart disease and other diseases of the circulatory system: Secondary | ICD-10-CM | POA: Diagnosis not present

## 2018-02-27 DIAGNOSIS — O133 Gestational [pregnancy-induced] hypertension without significant proteinuria, third trimester: Secondary | ICD-10-CM | POA: Diagnosis present

## 2018-02-27 DIAGNOSIS — J45909 Unspecified asthma, uncomplicated: Secondary | ICD-10-CM | POA: Diagnosis present

## 2018-02-27 DIAGNOSIS — O134 Gestational [pregnancy-induced] hypertension without significant proteinuria, complicating childbirth: Secondary | ICD-10-CM | POA: Diagnosis present

## 2018-02-27 MED ORDER — ACETAMINOPHEN 325 MG PO TABS: 650.0000 mg | ORAL_TABLET | ORAL | Status: DC | PRN

## 2018-02-27 MED ORDER — ONDANSETRON HCL 4 MG/2ML IJ SOLN
4.0000 mg | Freq: Four times a day (QID) | INTRAMUSCULAR | Status: DC | PRN
  Administered 2018-02-28: 4 mg via INTRAVENOUS
  Filled 2018-02-27: qty 2

## 2018-02-27 MED ORDER — TERBUTALINE SULFATE 1 MG/ML IJ SOLN: 0.2500 mg | Freq: Once | INTRAMUSCULAR | Status: DC | PRN

## 2018-02-27 MED ORDER — OXYCODONE-ACETAMINOPHEN 5-325 MG PO TABS: 2.0000 | ORAL_TABLET | ORAL | Status: DC | PRN

## 2018-02-27 MED ORDER — OXYTOCIN BOLUS FROM INFUSION
500.0000 mL | Freq: Once | INTRAVENOUS | Status: AC
  Administered 2018-02-28: 500 mL via INTRAVENOUS

## 2018-02-27 MED ORDER — OXYCODONE-ACETAMINOPHEN 5-325 MG PO TABS: 1.0000 | ORAL_TABLET | ORAL | Status: DC | PRN

## 2018-02-27 MED ORDER — SOD CITRATE-CITRIC ACID 500-334 MG/5ML PO SOLN: 30.0000 mL | ORAL | Status: DC | PRN

## 2018-02-27 MED ORDER — LACTATED RINGERS IV SOLN
500.0000 mL | INTRAVENOUS | Status: DC | PRN
  Administered 2018-02-27 (×2): 500 mL via INTRAVENOUS

## 2018-02-27 MED ORDER — OXYTOCIN 40 UNITS IN LACTATED RINGERS INFUSION - SIMPLE MED
1.0000 m[IU]/min | INTRAVENOUS | Status: DC
  Administered 2018-02-27: 14 m[IU]/min via INTRAVENOUS
  Administered 2018-02-27: 13 m[IU]/min via INTRAVENOUS
  Administered 2018-02-27: 1 m[IU]/min via INTRAVENOUS
  Administered 2018-02-28: 2 m[IU]/min via INTRAVENOUS

## 2018-02-27 MED ORDER — OXYTOCIN 40 UNITS IN LACTATED RINGERS INFUSION - SIMPLE MED
2.5000 [IU]/h | INTRAVENOUS | Status: DC
  Administered 2018-02-28: 2.5 [IU]/h via INTRAVENOUS
  Filled 2018-02-27: qty 1000

## 2018-02-27 MED ORDER — LIDOCAINE HCL (PF) 1 % IJ SOLN: 30.0000 mL | INTRAMUSCULAR | Status: DC | PRN

## 2018-02-27 MED ORDER — PENICILLIN G POT IN DEXTROSE 60000 UNIT/ML IV SOLN
3.0000 10*6.[IU] | INTRAVENOUS | Status: DC
  Administered 2018-02-27 – 2018-02-28 (×6): 3 10*6.[IU] via INTRAVENOUS
  Filled 2018-02-27 (×9): qty 50

## 2018-02-27 MED ORDER — LACTATED RINGERS IV SOLN
INTRAVENOUS | Status: DC
  Administered 2018-02-27 – 2018-02-28 (×5): via INTRAVENOUS

## 2018-02-27 MED ORDER — PENICILLIN G POTASSIUM 5000000 UNITS IJ SOLR
5.0000 10*6.[IU] | Freq: Once | INTRAMUSCULAR | Status: AC
  Administered 2018-02-27: 5 10*6.[IU] via INTRAVENOUS
  Filled 2018-02-27: qty 5

## 2018-02-27 MED ORDER — FLEET ENEMA 7-19 GM/118ML RE ENEM: 1.0000 | ENEMA | RECTAL | Status: DC | PRN

## 2018-02-27 NOTE — Progress Notes (Signed)
Megan Norris is a 27 y.o. G2P0010 at [id number]   Subjective: Pt is feeling cramping.  +Bloody show.  Objective: BP 135/72   Pulse 81   Temp 97.9 F (36.6 C) (Oral)   Resp 18   Ht 5\' 5"  (1.651 m)   Wt 101.6 kg (224 lb)   LMP 06/22/2017   SpO2 99%   BMI 37.28 kg/m  I/O last 3 completed shifts: In: 1985.8 [P.O.:240; I.V.:645.8; IV Piggyback:1100] Out: 550 [Urine:550] Total I/O In: 766.8 [I.V.:766.8] Out: -   FHT:  FHR: 140s bpm, variability: minimal to moderate,  accelerations:  Present,  decelerations:  Present intermittent variable decelerations UC:   Irregular, not tracing well.  RN @ bedside adjusting monitors.  SVE:   Dilation: 5 Effacement (%): 70 Station: -3 Exam by:: Lorn Junes. Goodman, RN Confirmed.  Bloody show noted.  Head behind symphysis pubis.  Labs: Lab Results  Component Value Date   WBC 9.2 02/26/2018   HGB 12.2 02/26/2018   HCT 36.0 02/26/2018   MCV 91.1 02/26/2018   PLT 196 02/26/2018    Assessment / Plan: IUP @ 35 5/7 weeks Gest HTN, IUGR.  Nonreassuring Fetal testing. S/p BMZ  @ 33 weeks  Labor: S/p Foley bulb.  Pitocin at 7 mUs.  Tolerating labor currently. Preeclampsia:  BP normal to mildly elevated Fetal Wellbeing:  Category II tracing (variable decelerations) overall reassuring. Pain Control:  Labor support without medications I/D:  GBS prophylaxis.  GBS culture pending in office. Anticipated MOD:  NSVD if baby tolerates labor.  Geryl RankinsEvelyn Anjalee Cope 02/27/2018, 1:27 PM

## 2018-02-27 NOTE — Anesthesia Pain Management Evaluation Note (Signed)
  CRNA Pain Management Visit Note  Patient: Megan Norris, 27 y.o., female  "Hello I am a member of the anesthesia team at [facility]. We have an anesthesia team available at all times to provide care throughout the hospital, including epidural management and anesthesia for C-section. I don't know your plan for the delivery whether it a natural birth, water birth, IV sedation, nitrous supplementation, doula or epidural, but we want to meet your pain goals."   1.Was your pain managed to your expectations on prior hospitalizations?   No prior hospitalizations  2.What is your expectation for pain management during this hospitalization?     Epidural  3.How can we help you reach that goal? Epidural when desired  Record the patient's initial score and the patient's pain goal.   Pain: 0  Pain Goal:5[facility] wants you to be able to say your pain was always managed very well.  Mellissa Conley 02/27/2018

## 2018-02-27 NOTE — Progress Notes (Signed)
Pt feels she may be feeling cramping intermittently.  BP 136/84   Pulse 83   Temp 98.2 F (36.8 C) (Oral)   Resp 18   Ht 5\' 5"  (1.651 m)   Wt 101.6 kg (224 lb)   LMP 06/22/2017   SpO2 99%   BMI 37.28 kg/m   Dilation: 1.5 Effacement (%): 50 Cervical Position: Middle Presentation: Vertex Exam by:: L.Clemmons, CNM  Foley bulb placed easily per Clemmons.  Cat I tracing currently.Great variability. No decelerations.  Irritability, no contractions.  CBC    Component Value Date/Time   WBC 9.2 02/26/2018 1724   RBC 3.95 02/26/2018 1724   HGB 12.2 02/26/2018 1724   HCT 36.0 02/26/2018 1724   PLT 196 02/26/2018 1724   MCV 91.1 02/26/2018 1724   MCH 30.9 02/26/2018 1724   MCHC 33.9 02/26/2018 1724   RDW 13.6 02/26/2018 1724   LYMPHSABS 1.6 02/25/2018 1636   MONOABS 0.5 02/25/2018 1636   EOSABS 0.0 02/25/2018 1636   BASOSABS 0.0 02/25/2018 1636    CMP     Component Value Date/Time   NA 133 (L) 02/26/2018 1724   K 4.1 02/26/2018 1724   CL 105 02/26/2018 1724   CO2 19 (L) 02/26/2018 1724   GLUCOSE 90 02/26/2018 1724   BUN 16 02/26/2018 1724   CREATININE 0.51 02/26/2018 1724   CALCIUM 9.2 02/26/2018 1724   PROT 6.7 02/26/2018 1724   ALBUMIN 3.0 (L) 02/26/2018 1724   AST 23 02/26/2018 1724   ALT 14 02/26/2018 1724   ALKPHOS 98 02/26/2018 1724   BILITOT 0.7 02/26/2018 1724   GFRNONAA >60 02/26/2018 1724   GFRAA >60 02/26/2018 1724    IUP @ 35 5/7 weeks Gest HTN, IUGR.  IOL due to nonreassuring fetal testing.  Overall reassuring tracing currently. S/p Foley Bulb placement for cervical ripening.  Start low dose Pitocin once expelled. F/u GBS.  Continue PCN due to preterm status and possibility of false negative.

## 2018-02-27 NOTE — Anesthesia Pain Management Evaluation Note (Signed)
  CRNA Pain Management Visit Note  Patient: Megan Norris, 27 y.o., female  "Hello I am a member of the anesthesia team at [facility]. We have an anesthesia team available at all times to provide care throughout the hospital, including epidural management and anesthesia for C-section. I don't know your plan for the delivery whether it a natural birth, water birth, IV sedation, nitrous supplementation, doula or epidural, but we want to meet your pain goals."   1.Was your pain managed to your expectations on prior hospitalizations?   No prior hospitalizations  2.What is your expectation for pain management during this hospitalization?     Epidural  3.How can we help you reach that goal? Standby  Record the patient's initial score and the patient's pain goal.   Pain: 3  Pain Goal: 7 [facility] wants you to be able to say your pain was always managed very well.  Ulice BoldDavid Adedayo [facility] 02/27/2018

## 2018-02-27 NOTE — Progress Notes (Signed)
Call Dr.Varnado if foley bulb comes out or if pt has any decelarations.

## 2018-02-27 NOTE — Plan of Care (Addendum)
0600 - Pt taught hand expression and discussed pumping schedule to establish milk supply  POC discussed with pt and Md.  All questions answered.  MD would like RN to continue increasing pitocin. Pt comfortable and resting in bed.

## 2018-02-27 NOTE — Progress Notes (Signed)
    Subjective: Mild occasional contractions. +FM no leakage of fluid no vaginal bleeding   Objective: BP 130/68   Pulse 74   Temp 97.9 F (36.6 C) (Oral)   Resp 16   Ht 5\' 5"  (1.651 m)   Wt 101.6 kg (224 lb)   LMP 06/22/2017   SpO2 99%   BMI 37.28 kg/m  I/O last 3 completed shifts: In: 1985.8 [P.O.:240; I.V.:645.8; IV Piggyback:1100] Out: 550 [Urine:550] Total I/O In: 817.1 [I.V.:817.1] Out: -   FHT:  FHR: 140 bpm, variability: moderate,  accelerations:  Present,  decelerations:  Present occasional variable  UC:   irregular, every 3-4 minutes SVE:   Dilation: 4.5 Effacement (%): 50 Station: Ballotable Exam by:: Dr. Richardson Doppole  Labs: Lab Results  Component Value Date   WBC 9.2 02/26/2018   HGB 12.2 02/26/2018   HCT 36.0 02/26/2018   MCV 91.1 02/26/2018   PLT 196 02/26/2018    Assessment / Plan: Induction of labor due to non-reassuring fetal testing and IUGR,  progressing well on pitocin  Labor: Progressing on Pitocin, will continue to increase then AROM Preeclampsia:  labs stable Fetal Wellbeing:  Category I and Category II Ovaerall category 1 Pain Control:  Labor support without medications I/D:  Penicillin  Anticipated MOD:  NSVD  CCOB covering after 7 pm Dr. Essie HartWalda Pinn and [facility] Hospital[facility] Jade [place]   Athol Bolds J. 02/27/2018, 6:27 PM

## 2018-02-28 ENCOUNTER — Ambulatory Visit (HOSPITAL_COMMUNITY)
Admission: RE | Admit: 2018-02-28 | Discharge: 2018-02-28 | Disposition: A | Discharge status: Home / Self Care | Payer: Medicaid Other | Source: Ambulatory Visit | Attending: Obstetrics & Gynecology | Admitting: Obstetrics & Gynecology

## 2018-02-28 ENCOUNTER — Encounter (HOSPITAL_COMMUNITY): Payer: Self-pay

## 2018-02-28 ENCOUNTER — Inpatient Hospital Stay (HOSPITAL_COMMUNITY): Payer: Medicaid Other | Admitting: Anesthesiology

## 2018-02-28 ENCOUNTER — Other Ambulatory Visit: Payer: Self-pay

## 2018-02-28 LAB — CBC
HEMATOCRIT: 37.5 % (ref 36.0–46.0)
HEMOGLOBIN: 12.8 g/dL (ref 12.0–15.0)
MCH: 31.4 pg (ref 26.0–34.0)
MCHC: 34.1 g/dL (ref 30.0–36.0)
MCV: 91.9 fL (ref 78.0–100.0)
Platelets: 184 10*3/uL (ref 150–400)
RBC: 4.08 MIL/uL (ref 3.87–5.11)
RDW: 13.7 % (ref 11.5–15.5)
WBC: 13.4 10*3/uL — ABNORMAL HIGH (ref 4.0–10.5)

## 2018-02-28 MED ORDER — DIPHENHYDRAMINE HCL 25 MG PO CAPS: 25.0000 mg | ORAL_CAPSULE | Freq: Four times a day (QID) | ORAL | Status: DC | PRN

## 2018-02-28 MED ORDER — EPHEDRINE 5 MG/ML INJ: 10.0000 mg | INTRAVENOUS | Status: DC | PRN

## 2018-02-28 MED ORDER — IBUPROFEN 600 MG PO TABS
600.0000 mg | ORAL_TABLET | Freq: Four times a day (QID) | ORAL | Status: DC
  Administered 2018-02-28 – 2018-03-04 (×18): 600 mg via ORAL
  Filled 2018-02-28 (×18): qty 1

## 2018-02-28 MED ORDER — ZOLPIDEM TARTRATE 5 MG PO TABS: 5.0000 mg | ORAL_TABLET | Freq: Every evening | ORAL | Status: DC | PRN

## 2018-02-28 MED ORDER — OXYCODONE-ACETAMINOPHEN 5-325 MG PO TABS: 1.0000 | ORAL_TABLET | ORAL | Status: DC | PRN

## 2018-02-28 MED ORDER — PHENYLEPHRINE 40 MCG/ML (10ML) SYRINGE FOR IV PUSH (FOR BLOOD PRESSURE SUPPORT)
80.0000 ug | PREFILLED_SYRINGE | INTRAVENOUS | Status: DC | PRN
  Filled 2018-02-28: qty 10

## 2018-02-28 MED ORDER — LIDOCAINE HCL (PF) 1 % IJ SOLN
INTRAMUSCULAR | Status: DC | PRN
  Administered 2018-02-28: 5 mL via EPIDURAL

## 2018-02-28 MED ORDER — ONDANSETRON HCL 4 MG/2ML IJ SOLN: 4.0000 mg | INTRAMUSCULAR | Status: DC | PRN

## 2018-02-28 MED ORDER — PHENYLEPHRINE 40 MCG/ML (10ML) SYRINGE FOR IV PUSH (FOR BLOOD PRESSURE SUPPORT): 80.0000 ug | PREFILLED_SYRINGE | INTRAVENOUS | Status: DC | PRN

## 2018-02-28 MED ORDER — ACETAMINOPHEN 325 MG PO TABS: 650.0000 mg | ORAL_TABLET | ORAL | Status: DC | PRN

## 2018-02-28 MED ORDER — BENZOCAINE-MENTHOL 20-0.5 % EX AERO
1.0000 "application " | INHALATION_SPRAY | CUTANEOUS | Status: DC | PRN
  Administered 2018-02-28: 1 via TOPICAL
  Filled 2018-02-28: qty 56

## 2018-02-28 MED ORDER — LACTATED RINGERS IV SOLN: 500.0000 mL | Freq: Once | INTRAVENOUS | Status: DC

## 2018-02-28 MED ORDER — LACTATED RINGERS IV SOLN: INTRAVENOUS | Status: DC

## 2018-02-28 MED ORDER — ONDANSETRON HCL 4 MG PO TABS: 4.0000 mg | ORAL_TABLET | ORAL | Status: DC | PRN

## 2018-02-28 MED ORDER — PRENATAL MULTIVITAMIN CH
1.0000 | ORAL_TABLET | Freq: Every day | ORAL | Status: DC
  Administered 2018-02-28 – 2018-03-04 (×5): 1 via ORAL
  Filled 2018-02-28 (×5): qty 1

## 2018-02-28 MED ORDER — OXYCODONE-ACETAMINOPHEN 5-325 MG PO TABS: 2.0000 | ORAL_TABLET | ORAL | Status: DC | PRN

## 2018-02-28 MED ORDER — DIPHENHYDRAMINE HCL 50 MG/ML IJ SOLN: 12.5000 mg | INTRAMUSCULAR | Status: DC | PRN

## 2018-02-28 MED ORDER — TETANUS-DIPHTH-ACELL PERTUSSIS 5-2.5-18.5 LF-MCG/0.5 IM SUSP: 0.5000 mL | Freq: Once | INTRAMUSCULAR | Status: DC

## 2018-02-28 MED ORDER — PHENYLEPHRINE 40 MCG/ML (10ML) SYRINGE FOR IV PUSH (FOR BLOOD PRESSURE SUPPORT)
80.0000 ug | PREFILLED_SYRINGE | INTRAVENOUS | Status: DC | PRN
  Administered 2018-02-28 (×2): 80 ug via INTRAVENOUS

## 2018-02-28 MED ORDER — WITCH HAZEL-GLYCERIN EX PADS: 1.0000 "application " | MEDICATED_PAD | CUTANEOUS | Status: DC | PRN

## 2018-02-28 MED ORDER — LACTATED RINGERS IV SOLN
INTRAVENOUS | Status: DC
  Administered 2018-02-28: 03:00:00 via INTRAUTERINE

## 2018-02-28 MED ORDER — DIBUCAINE 1 % RE OINT: 1.0000 "application " | TOPICAL_OINTMENT | RECTAL | Status: DC | PRN

## 2018-02-28 MED ORDER — SIMETHICONE 80 MG PO CHEW: 80.0000 mg | CHEWABLE_TABLET | ORAL | Status: DC | PRN

## 2018-02-28 MED ORDER — COCONUT OIL OIL
1.0000 "application " | TOPICAL_OIL | Status: DC | PRN
  Administered 2018-03-01: 1 via TOPICAL
  Filled 2018-02-28: qty 120

## 2018-02-28 MED ORDER — SENNOSIDES-DOCUSATE SODIUM 8.6-50 MG PO TABS
2.0000 | ORAL_TABLET | ORAL | Status: DC
  Administered 2018-02-28 – 2018-03-03 (×4): 2 via ORAL
  Filled 2018-02-28 (×4): qty 2

## 2018-02-28 MED ORDER — FENTANYL 2.5 MCG/ML BUPIVACAINE 1/10 % EPIDURAL INFUSION (WH - ANES)
14.0000 mL/h | INTRAMUSCULAR | Status: DC | PRN
  Administered 2018-02-28: 14 mL/h via EPIDURAL
  Filled 2018-02-28: qty 100

## 2018-02-28 NOTE — Lactation Note (Signed)
This note was copied from a baby's chart. Lactation Consultation Note  Patient Name: Megan Norris [id number] Date: 02/28/2018 Reason for consult: Initial assessment;Primapara;1st time breastfeeding;NICU baby;Infant < 6lbs;Late-preterm 68-36.6wks Mom with LPT infant in NICU/IUGR.  Mom reports she is on [facility] in [place].  Mom reports WIC came to see her today but did not inquire about pumping.  Referral faxed to [facility] on moms behalf regarding need for DEBP. Reviewed NICU booklet with mom.  Urged pumping 8-10- times/day for 15 minutes in 24 hours/urged not to go longer than 4-5 hours at night without pumping. Visitors in room. Urged mom to watch [employer] on Pumping video.  Mom reports was shown how to do hand expression. Urged to pump at infants bedside.  Mom has conversion kit in case she receives [place] pump.  Reports she saw that pump(the blue one) demonstrated in her breastfeeding class at [facility] Reviewed pumping techniques with mom.  Praised pumping efforts.  Mom reports getting small amounts of colostrum at each pumping  Maternal Data Has patient been taught Hand Expression?: Yes(mom reprots was taught hand expression by RN) Does the patient have breastfeeding experience prior to this delivery?: No  Feeding    LATCH Score                   Interventions Interventions: Breast massage;Hand express;DEBP  Lactation Tools Discussed/Used WIC Program: Yes Pump Review: Setup, frequency, and cleaning Initiated by:: (inittiated by RN)   Consult Status Consult Status: Follow-up Date: 03/01/18 Follow-up type: In-patient    Gerold Sar Thompson Caul 02/28/2018, 2:25 PM

## 2018-02-28 NOTE — Anesthesia Preprocedure Evaluation (Signed)
Anesthesia Evaluation  Patient identified by MRN, date of birth, ID band Patient awake    Reviewed: Allergy & Precautions, NPO status , Patient's Chart, lab work & pertinent test results  Airway Mallampati: II  TM Distance: >3 FB Neck ROM: Full    Dental no notable dental hx. (+) Teeth Intact, Dental Advisory Given   Pulmonary asthma ,    Pulmonary exam normal breath sounds clear to auscultation       Cardiovascular hypertension, Normal cardiovascular exam Rhythm:Regular Rate:Normal     Neuro/Psych negative neurological ROS  negative psych ROS   GI/Hepatic negative GI ROS, Neg liver ROS,   Endo/Other  Morbid obesity  Renal/GU negative Renal ROS     Musculoskeletal   Abdominal (+) + obese,   Peds  Hematology negative hematology ROS (+)   Anesthesia Other Findings   Reproductive/Obstetrics (+) Pregnancy                             Lab Results  Component Value Date   WBC 13.4 (H) 02/28/2018   HGB 12.8 02/28/2018   HCT 37.5 02/28/2018   MCV 91.9 02/28/2018   PLT 184 02/28/2018    Anesthesia Physical Anesthesia Plan  ASA: III  Anesthesia Plan: Epidural   Post-op Pain Management:    Induction:   PONV Risk Score and Plan:   Airway Management Planned:   Additional Equipment:   Intra-op Plan:   Post-operative Plan:   Informed Consent: I have reviewed the patients History and Physical, chart, labs and discussed the procedure including the risks, benefits and alternatives for the proposed anesthesia with the patient or authorized representative who has indicated his/her understanding and acceptance.     Plan Discussed with:   Anesthesia Plan Comments:         Anesthesia Quick Evaluation

## 2018-02-28 NOTE — Anesthesia Procedure Notes (Signed)
Epidural Patient location during procedure: OB Start time: 02/28/2018 12:59 AM End time: 02/28/2018 1:13 AM  Staffing Anesthesiologist: Trevor IhaHouser, Stephen A, MD Performed: anesthesiologist   Preanesthetic Checklist Completed: patient identified, site marked, surgical consent, pre-op evaluation, timeout performed, IV checked, risks and benefits discussed and monitors and equipment checked  Epidural Patient position: sitting Prep: site prepped and draped and DuraPrep Patient monitoring: continuous pulse ox and blood pressure Approach: midline Location: L3-L4 Injection technique: LOR air  Needle:  Needle type: Tuohy  Needle gauge: 17 G Needle length: 9 cm and 9 Needle insertion depth: 8 cm Catheter type: closed end flexible Catheter size: 19 Gauge Catheter at skin depth: 13 cm Test dose: negative  Assessment Events: blood not aspirated, injection not painful, no injection resistance, negative IV test and no paresthesia

## 2018-02-28 NOTE — Progress Notes (Signed)
Labor Progress Note  Subjective: Megan Norris, 27 y.o., G2P0010, with an IUP @ 5740w6d, presented for Gest HTN, IUGR now with decreased fetal movement and non reassuring fetal testing. Patient Active Problem List   Diagnosis Date Noted  . Antepartum non-reassuring fetal heart rate or rhythm affecting care of mother 02/26/2018  . Allergic conjunctivitis and rhinitis 05/02/2015  . Mild intermittent asthma 05/02/2015  . Atypical chest pain 04/27/2013   Pt resting in bed, with family at bedside. I was called to the room for repetitive variable decels. Pt being reposition and variable with a nadir of 70 with contractions return to baseline of 145s. Pt endorses feeling pressure in rectum during contraction but goes away when contractions gone.   Objective: BP (!) 120/57   Pulse 66   Temp 98.3 F (36.8 C) (Oral)   Resp 16   Ht 5\' 5"  (1.651 m)   Wt 101.6 kg (224 lb)   LMP 06/22/2017   SpO2 99%   BMI 37.28 kg/m  I/O last 3 completed shifts: In: 2841.3 [P.O.:240; I.V.:1501.3; IV Piggyback:1100] Out: 550 [Urine:550] Total I/O In: 2558.8 [I.V.:2558.8] Out: -  NST: FHR baseline 145 bpm, Variability: moderate, Accelerations:present, Decelerations:  Present  repetative varibiles = Cat 2/Reactive CTX:  regular, every 2-4 minutes, moderate to palpate  Uterus gravid, soft non tender, moderate to palpate with contractions.  SVE:  Dilation: 9 Effacement (%): 100 Station: 0 Exam by:: [facility] CenterJade Agape Hardiman CNM Pitocin at 2 mUn/min MVU: IUPC in place with reading of 200  Assessment:  Megan Norris, 27 y.o., G2P0010, with an IUP @ 2540w6d, presented for Gest HTN, IUGR now with decreased fetal movement and non reassuring fetal testing. Patient Active Problem List   Diagnosis Date Noted  . Antepartum non-reassuring fetal heart rate or rhythm affecting care of mother 02/26/2018  . Allergic conjunctivitis and rhinitis 05/02/2015  . Mild intermittent asthma 05/02/2015  . Atypical chest pain 04/27/2013   NICHD: Category 2 Category 2 with active intrauterine resuscitative measures repositioning, amnio, turing pit off.   Membranes:  AROM'ed by Dr Mora ApplPinn x 5hrs, no s/s of infection  MVUs 200  Induction:    Pitocin - Just turned off  Pain management:               Epidural placement:  at 0100 on 07/10  GBS Unknown   Abx: Penicillin x 7   Plan: Continue labor plan Continuous/intermittent monitoring Rest/Labor down Frequent position changes to facilitate fetal rotation and descent. Will reassess with cervical exam when pt states its necessary with rectal pressure.  Turned pitocin off.  Amnioinfusion with bolus of 300 cc, then 150 cc/hr.Started Anticipate labor progression and vaginal delivery.   Md Pinn aware of plan and verbalized agreement.   Dale DurhamJade Laronn Devonshire, NP-C, CNM, MSN 02/28/2018. 3:06 AM

## 2018-02-28 NOTE — Anesthesia Postprocedure Evaluation (Signed)
Anesthesia Post Note  Patient: Megan Norris  Procedure(s) Performed: AN AD HOC LABOR EPIDURAL     Patient location during evaluation: Mother Baby Anesthesia Type: Epidural Level of consciousness: awake and alert and oriented Pain management: satisfactory to patient Vital Signs Assessment: post-procedure vital signs reviewed and stable Respiratory status: spontaneous breathing and nonlabored ventilation Cardiovascular status: stable Postop Assessment: no headache, no backache, no signs of nausea or vomiting, adequate PO intake, patient able to bend at knees and epidural receding (patient up walking) Anesthetic complications: no    Last Vitals:  Vitals:   02/28/18 0629 02/28/18 0815  BP: 134/72 (!) 142/73  Pulse: 77 94  Resp: 20 20  Temp: 37.7 C 37.3 C  SpO2:  99%    Last Pain:  Vitals:   02/28/18 0815  TempSrc: Oral  PainSc:    Pain Goal: Patients Stated Pain Goal: 3 (02/28/18 0645)               Madison HickmanGREGORY,Isaak Delmundo

## 2018-02-28 NOTE — Progress Notes (Signed)
Late entry.  Pt seen ~ 3:00 PM  Postpartum day #0, NSVD  Subjective Pt without complaints. Pain controlled.  Breast feeding yes.  Temp:  [98.1 F (36.7 C)-99.9 F (37.7 C)] 98.4 F (36.9 C) (07/10 1932) Pulse Rate:  [66-116] 72 (07/10 1932) Resp:  [16-20] 20 (07/10 1932) BP: (72-145)/(42-108) 129/90 (07/10 1932) SpO2:  [99 %-100 %] 100 % (07/10 1932)  Gen:  NAD, A&O x 3 Uterine fundus:  Firm, nontender Lochia normal Ext:  Deferred.  CBC    Component Value Date/Time   WBC 13.4 (H) 02/28/2018 0034   RBC 4.08 02/28/2018 0034   HGB 12.8 02/28/2018 0034   HCT 37.5 02/28/2018 0034   PLT 184 02/28/2018 0034   MCV 91.9 02/28/2018 0034   MCH 31.4 02/28/2018 0034   MCHC 34.1 02/28/2018 0034   RDW 13.7 02/28/2018 0034   LYMPHSABS 1.6 02/25/2018 1636   MONOABS 0.5 02/25/2018 1636   EOSABS 0.0 02/25/2018 1636   BASOSABS 0.0 02/25/2018 1636     A/P: S/p SVD doing well. Baby in NICU is stable. Routine postpartum care. Lactation support. Discharge PPD #2.  Megan Rankinsvelyn Lynette Norris 02/28/2018, 9:43 PM

## 2018-03-01 LAB — CBC
HEMATOCRIT: 34.1 % — AB (ref 36.0–46.0)
HEMOGLOBIN: 11.5 g/dL — AB (ref 12.0–15.0)
MCH: 30.9 pg (ref 26.0–34.0)
MCHC: 33.7 g/dL (ref 30.0–36.0)
MCV: 91.7 fL (ref 78.0–100.0)
Platelets: 174 10*3/uL (ref 150–400)
RBC: 3.72 MIL/uL — ABNORMAL LOW (ref 3.87–5.11)
RDW: 13.8 % (ref 11.5–15.5)
WBC: 8.5 10*3/uL (ref 4.0–10.5)

## 2018-03-01 NOTE — Progress Notes (Signed)
Postpartum day #1, NSVD  Subjective Pt without complaints. Pain controlled.  Breast feeding yes.  Tolerating gen diet.  +flatus, no BM.  Lochia minimal.  NO F/C/CP/SOB.  No acute complaints  Temp:  [97.9 F (36.6 C)-99.2 F (37.3 C)] 97.9 F (36.6 C) (07/11 0425) Pulse Rate:  [62-94] 62 (07/11 0425) Resp:  [16-20] 16 (07/11 0425) BP: (110-142)/(66-90) 110/66 (07/11 0425) SpO2:  [99 %-100 %] 100 % (07/11 0425)  Gen:  NAD, A&O x 3 CV: RRR Lungs: CTAB Uterine fundus:  Firm, nontender, below umbilicus Ext: no edema, no calf tenderness bilaterally  CBC Latest Ref Rng & Units 03/01/2018 02/28/2018 02/26/2018  WBC 4.0 - 10.5 K/uL 8.5 13.4(H) 9.2  Hemoglobin 12.0 - 15.0 g/dL 11.5(L) 12.8 12.2  Hematocrit 36.0 - 46.0 % 34.1(L) 37.5 36.0  Platelets 150 - 400 K/uL 174 184 196    A/P: S/p SVD doing well, PPD#1 Baby in NICU stable. Routine postpartum care. Lactation support. Continue with routine postpartum care, plan for discharge home tomorrow  Myna HidalgoJennifer Shilynn Hoch, DO [phone] (cell) [phone] (office)

## 2018-03-01 NOTE — Lactation Note (Signed)
This note was copied from a baby's chart. Lactation Consultation Note  Patient Name: Megan Norris PlanJazmine Volker Today's Date: 03/01/2018   Attempted to visit, Mom not in room.  Will follow-up.   Judee ClaraSmith, Ruari Mudgett E 03/01/2018, 2:41 PM

## 2018-03-02 LAB — CBC
HEMATOCRIT: 37.1 % (ref 36.0–46.0)
Hemoglobin: 12.7 g/dL (ref 12.0–15.0)
MCH: 31.3 pg (ref 26.0–34.0)
MCHC: 34.2 g/dL (ref 30.0–36.0)
MCV: 91.4 fL (ref 78.0–100.0)
PLATELETS: 210 10*3/uL (ref 150–400)
RBC: 4.06 MIL/uL (ref 3.87–5.11)
RDW: 13.6 % (ref 11.5–15.5)
WBC: 7.9 10*3/uL (ref 4.0–10.5)

## 2018-03-02 LAB — COMPREHENSIVE METABOLIC PANEL
ALT: 42 U/L (ref 0–44)
ANION GAP: 9 (ref 5–15)
AST: 58 U/L — ABNORMAL HIGH (ref 15–41)
Albumin: 3.1 g/dL — ABNORMAL LOW (ref 3.5–5.0)
Alkaline Phosphatase: 95 U/L (ref 38–126)
BUN: 13 mg/dL (ref 6–20)
CHLORIDE: 101 mmol/L (ref 98–111)
CO2: 26 mmol/L (ref 22–32)
Calcium: 9.6 mg/dL (ref 8.9–10.3)
Creatinine, Ser: 0.81 mg/dL (ref 0.44–1.00)
GFR calc Af Amer: >60 mL/min (ref >60)
GFR calc non Af Amer: >60 mL/min (ref >60)
Glucose, Bld: 85 mg/dL (ref 70–99)
POTASSIUM: 4.2 mmol/L (ref 3.5–5.1)
Sodium: 136 mmol/L (ref 135–145)
Total Bilirubin: 0.6 mg/dL (ref 0.3–1.2)
Total Protein: 6.8 g/dL (ref 6.5–8.1)

## 2018-03-02 LAB — PROTEIN / CREATININE RATIO, URINE: CREATININE, URINE: 26 mg/dL

## 2018-03-02 MED ORDER — IBUPROFEN 600 MG PO TABS: 600.0000 mg | ORAL_TABLET | Freq: Four times a day (QID) | ORAL | 0 refills | Status: DC

## 2018-03-02 MED FILL — IBUPROFEN 600 MG TABLET: 600 | 3 days supply | Qty: 12 | Fill #0

## 2018-03-02 NOTE — Discharge Instructions (Addendum)
Vaginal Delivery, Care After °Refer to this sheet in the next few weeks. These instructions provide you with information about caring for yourself after vaginal delivery. Your health care provider may also give you more specific instructions. Your treatment has been planned according to current medical practices, but problems sometimes occur. Call your health care provider if you have any problems or questions. °What can I expect after the procedure? °After vaginal delivery, it is common to have: °· Some bleeding from your vagina. °· Soreness in your abdomen, your vagina, and the area of skin between your vaginal opening and your anus (perineum). °· Pelvic cramps. °· Fatigue. ° °Follow these instructions at home: °Medicines °· Take over-the-counter and prescription medicines only as told by your health care provider. °· If you were prescribed an antibiotic medicine, take it as told by your health care provider. Do not stop taking the antibiotic until it is finished. °Driving ° °· Do not drive or operate heavy machinery while taking prescription pain medicine. °· Do not drive for 24 hours if you received a sedative. °Lifestyle °· Do not drink alcohol. This is especially important if you are breastfeeding or taking medicine to relieve pain. °· Do not use tobacco products, including cigarettes, chewing tobacco, or e-cigarettes. If you need help quitting, ask your health care provider. °Eating and drinking °· Drink at least 8 eight-ounce glasses of water every day unless you are told not to by your health care provider. If you choose to breastfeed your baby, you may need to drink more water than this. °· Eat high-fiber foods every day. These foods may help prevent or relieve constipation. High-fiber foods include: °? Whole grain cereals and breads. °? Brown rice. °? Beans. °? Fresh fruits and vegetables. °Activity °· Return to your normal activities as told by your health care provider. Ask your health care provider  what activities are safe for you. °· Rest as much as possible. Try to rest or take a nap when your baby is sleeping. °· Do not lift anything that is heavier than your baby or 10 lb (4.5 kg) until your health care provider says that it is safe. °· Talk with your health care provider about when you can engage in sexual activity. This may depend on your: °? Risk of infection. °? Rate of healing. °? Comfort and desire to engage in sexual activity. °Vaginal Care °· If you have an episiotomy or a vaginal tear, check the area every day for signs of infection. Check for: °? More redness, swelling, or pain. °? More fluid or blood. °? Warmth. °? Pus or a bad smell. °· Do not use tampons or douches until your health care provider says this is safe. °· Watch for any blood clots that may pass from your vagina. These may look like clumps of dark red, brown, or black discharge. °General instructions °· Keep your perineum clean and dry as told by your health care provider. °· Wear loose, comfortable clothing. °· Wipe from front to back when you use the toilet. °· Ask your health care provider if you can shower or take a bath. If you had an episiotomy or a perineal tear during labor and delivery, your health care provider may tell you not to take baths for a certain length of time. °· Wear a bra that supports your breasts and fits you well. °· If possible, have someone help you with household activities and help care for your baby for at least a few days after   you leave the hospital. °· Keep all follow-up visits for you and your baby as told by your health care provider. This is important. °Contact a health care provider if: °· You have: °? Vaginal discharge that has a bad smell. °? Difficulty urinating. °? Pain when urinating. °? A sudden increase or decrease in the frequency of your bowel movements. °? More redness, swelling, or pain around your episiotomy or vaginal tear. °? More fluid or blood coming from your episiotomy or  vaginal tear. °? Pus or a bad smell coming from your episiotomy or vaginal tear. °? A fever. °? A rash. °? Little or no interest in activities you used to enjoy. °? Questions about caring for yourself or your baby. °· Your episiotomy or vaginal tear feels warm to the touch. °· Your episiotomy or vaginal tear is separating or does not appear to be healing. °· Your breasts are painful, hard, or turn red. °· You feel unusually sad or worried. °· You feel nauseous or you vomit. °· You pass large blood clots from your vagina. If you pass a blood clot from your vagina, save it to show to your health care provider. Do not flush blood clots down the toilet without having your health care provider look at them. °· You urinate more than usual. °· You are dizzy or light-headed. °· You have not breastfed at all and you have not had a menstrual period for 12 weeks after delivery. °· You have stopped breastfeeding and you have not had a menstrual period for 12 weeks after you stopped breastfeeding. °Get help right away if: °· You have: °? Pain that does not go away or does not get better with medicine. °? Chest pain. °? Difficulty breathing. °? Blurred vision or spots in your vision. °? Thoughts about hurting yourself or your baby. °· You develop pain in your abdomen or in one of your legs. °· You develop a severe headache. °· You faint. °· You bleed from your vagina so much that you fill two sanitary pads in one hour. °This information is not intended to replace advice given to you by your health care provider. Make sure you discuss any questions you have with your health care provider. °Document Released: 08/05/2000 Document Revised: 01/20/2016 Document Reviewed: 08/23/2015 °Elsevier Interactive Patient Education © 2018 [employer] ° ° ° °Preeclampsia and Eclampsia °Preeclampsia is a serious condition that develops only during pregnancy. It is also called toxemia of pregnancy. This condition causes high blood pressure along  with other symptoms, such as swelling and headaches. These symptoms may develop as the condition gets worse. Preeclampsia may occur at 20 weeks of pregnancy or later. °Diagnosing and treating preeclampsia early is very important. If not treated early, it can cause serious problems for you and your baby. One problem it can lead to is eclampsia, which is a condition that causes muscle jerking or shaking (convulsions or seizures) in the mother. Delivering your baby is the best treatment for preeclampsia or eclampsia. Preeclampsia and eclampsia symptoms usually go away after your baby is born. °What are the causes? °The cause of preeclampsia is not known. °What increases the risk? °The following risk factors make you more likely to develop preeclampsia: °· Being pregnant for the first time. °· Having had preeclampsia during a past pregnancy. °· Having a family history of preeclampsia. °· Having high blood pressure. °· Being pregnant with twins or triplets. °· Being 35 or older. °· Being African-American. °· Having kidney disease or diabetes. °·   Having medical conditions such as lupus or blood diseases. °· Being very overweight (obese). ° °What are the signs or symptoms? °The earliest signs of preeclampsia are: °· High blood pressure. °· Increased protein in your urine. Your health care provider will check for this at every visit before you give birth (prenatal visit). ° °Other symptoms that may develop as the condition gets worse include: °· Severe headaches. °· Sudden weight gain. °· Swelling of the hands, face, legs, and feet. °· Nausea and vomiting. °· Vision problems, such as blurred or double vision. °· Numbness in the face, arms, legs, and feet. °· Urinating less than usual. °· Dizziness. °· Slurred speech. °· Abdominal pain, especially upper abdominal pain. °· Convulsions or seizures. ° °Symptoms generally go away after giving birth. °How is this diagnosed? °There are no screening tests for preeclampsia. Your  health care provider will ask you about symptoms and check for signs of preeclampsia during your prenatal visits. You may also have tests that include: °· Urine tests. °· Blood tests. °· Checking your blood pressure. °· Monitoring your baby’s heart rate. °· Ultrasound. ° °How is this treated? °You and your health care provider will determine the treatment approach that is best for you. Treatment may include: °· Having more frequent prenatal exams to check for signs of preeclampsia, if you have an increased risk for preeclampsia. °· Bed rest. °· Reducing how much salt (sodium) you eat. °· Medicine to lower your blood pressure. °· Staying in the hospital, if your condition is severe. There, treatment will focus on controlling your blood pressure and the amount of fluids in your body (fluid retention). °· You may need to take medicine (magnesium sulfate) to prevent seizures. This medicine may be given as an injection or through an IV tube. °· Delivering your baby early, if your condition gets worse. You may have your labor started with medicine (induced), or you may have a cesarean delivery. ° °Follow these instructions at home: °Eating and drinking ° °· Drink enough fluid to keep your urine clear or pale yellow. °· Eat a healthy diet that is low in sodium. Do not add salt to your food. Check nutrition labels to see how much sodium a food or beverage contains. °· Avoid caffeine. °Lifestyle °· Do not use any products that contain nicotine or tobacco, such as cigarettes and e-cigarettes. If you need help quitting, ask your health care provider. °· Do not use alcohol or drugs. °· Avoid stress as much as possible. Rest and get plenty of sleep. °General instructions °· Take over-the-counter and prescription medicines only as told by your health care provider. °· When lying down, lie on your side. This keeps pressure off of your baby. °· When sitting or lying down, raise (elevate) your feet. Try putting some pillows  underneath your lower legs. °· Exercise regularly. Ask your health care provider what kinds of exercise are best for you. °· Keep all follow-up and prenatal visits as told by your health care provider. This is important. °How is this prevented? °To prevent preeclampsia or eclampsia from developing during another pregnancy: °· Get proper medical care during pregnancy. Your health care provider may be able to prevent preeclampsia or diagnose and treat it early. °· Your health care provider may have you take a low-dose aspirin or a calcium supplement during your next pregnancy. °· You may have tests of your blood pressure and kidney function after giving birth. °· Maintain a healthy weight. Ask your health care provider for   help managing weight gain during pregnancy. °· Work with your health care provider to manage any long-term (chronic) health conditions you have, such as diabetes or kidney problems. ° °Contact a health care provider if: °· You gain more weight than expected. °· You have headaches. °· You have nausea or vomiting. °· You have abdominal pain. °· You feel dizzy or light-headed. °Get help right away if: °· You develop sudden or severe swelling anywhere in your body. This usually happens in the legs. °· You gain 5 lbs (2.3 kg) or more during one week. °· You have severe: °? Abdominal pain. °? Headaches. °? Dizziness. °? Vision problems. °? Confusion. °? Nausea or vomiting. °· You have a seizure. °· You have trouble moving any part of your body. °· You develop numbness in any part of your body. °· You have trouble speaking. °· You have any abnormal bleeding. °· You pass out. °This information is not intended to replace advice given to you by your health care provider. Make sure you discuss any questions you have with your health care provider. °Document Released: 08/05/2000 Document Revised: 04/05/2016 Document Reviewed: 03/14/2016 °Elsevier Interactive Patient Education © 2018 [employer] Inc[employer] ° °

## 2018-03-02 NOTE — Progress Notes (Signed)
Post Partum Day 2 Subjective: Pt without complaints.  Denies headaches, visual changes, abdominal pain.  Bleeding minimal.  Objective: Blood pressure 121/89, pulse 64, temperature 99 F (37.2 C), temperature source Oral, resp. rate 18, height 5\' 5"  (1.651 m), weight 101.6 kg (224 lb), last menstrual period 06/22/2017, SpO2 100 %, unknown if currently breastfeeding.  Physical Exam:  General: alert, cooperative and no distress  CV:  RRR Lungs:  CTA bilaterally  Lochia: appropriate Uterine Fundus: firm Incision: n/a DVT Evaluation: No evidence of DVT seen on physical exam. Calf/Ankle edema is present. Neuro: 1-2+  Recent Labs    02/28/18 0034 03/01/18 0545  HGB 12.8 11.5*  HCT 37.5 34.1*   AST/ALT:   Assessment/Plan: S/p SVD PPD #2 H/o Gestational HTN  Elevated BP x 2.    Continue inpatient observation. Discharge cancelled. Check Preeclampsia labs.  Start Magnesium as indicated. Strict I/Os.   LOS: 3 days   Geryl Rankinsvelyn Anarosa Kubisiak 03/02/2018, 6:16 PM

## 2018-03-02 NOTE — Discharge Summary (Addendum)
OB Discharge Summary     Patient Name: Megan Norris DOB: 04/16/1991 MRN: [id number]  Date of admission: 02/26/2018 Delivering MD: Essie Hart   Date of discharge: 03/04/2018  Admitting diagnosis: 35.4 WKS, ELEVATED BP, Non reassuring fetal testing. Intrauterine pregnancy: [redacted]w[redacted]d     Secondary diagnosis:  Active Problems:   Antepartum non-reassuring fetal heart rate or rhythm affecting care of mother  Additional problems: Fetal decelerations     Discharge diagnosis: Preterm Pregnancy Delivered and Gestational Hypertension  Preeclampsia                                                               Post partum procedures: Magnesium sulfate                                Augmentation: AROM, Pitocin and Foley Balloon  Complications: None  Hospital course:  Induction of Labor With Vaginal Delivery   27 y.o. yo G2P0111 at [redacted]w[redacted]d was admitted to the hospital 02/26/2018 for induction of labor.  Indication for induction: IUGR, Fetal decelerations.  Patient had an uncomplicated labor course as follows: Membrane Rupture Time/Date: 9:39 PM ,02/27/2018   Intrapartum Procedures: Episiotomy: None [1]                                         Lacerations:  None [1]  Patient had delivery of a Viable infant.  Information for the patient's newborn:  Evangelene, Vora [[id number]]  Delivery Method: Vaginal, Vacuum (Extractor)(Filed from Delivery Summary)   02/28/2018  Details of delivery can be found in separate delivery note.  Patient had a routine postpartum course. Patient is discharged home 03/04/2018.  Physical exam  Vitals:   03/01/18 1623 03/01/18 2015 03/02/18 0911 03/02/18 1215  BP: 138/90 130/82 121/83 (!) 123/101  Pulse: 64 71 62 70  Resp: 18 18 17 18   Temp: 98.6 F (37 C) 97.8 F (36.6 C) 98.6 F (37 C) 97.9 F (36.6 C)  TempSrc: Oral Oral Oral Oral  SpO2: 100% 100% 100% 100%  Weight:      Height:       Temp:  [98.1 F (36.7 C)-98.5 F (36.9 C)] 98.5 F (36.9 C) (07/14  1224) Pulse Rate:  [72-92] 91 (07/14 1803) Resp:  [16-18] 16 (07/14 1224) BP: (124-141)/(82-102) 124/97 (07/14 1803) SpO2:  [90 %-100 %] 100 % (07/14 1224)  General: alert, cooperative and no distress Lochia: appropriate Uterine Fundus: firm Incision: N/A DVT Evaluation: No evidence of DVT seen on physical exam. Calf/Ankle edema is present Labs:  CBC Latest Ref Rng & Units 03/04/2018 03/02/2018 03/01/2018  WBC 4.0 - 10.5 K/uL 7.1 7.9 8.5  Hemoglobin 12.0 - 15.0 g/dL 78.2 95.6 11.5(L)  Hematocrit 36.0 - 46.0 % 38.6 37.1 34.1(L)  Platelets 150 - 400 K/uL 227 210 174   CMP Latest Ref Rng & Units 03/04/2018 03/03/2018 03/02/2018  Glucose 70 - 99 mg/dL 93 88 85  BUN 6 - 20 mg/dL 13 15 13   Creatinine 0.44 - 1.00 mg/dL 2.13 0.86 5.78  Sodium 135 - 145 mmol/L 136 135 136  Potassium 3.5 - 5.1 mmol/L 4.0  4.3 4.2  Chloride 98 - 111 mmol/L 101 101 101  CO2 22 - 32 mmol/L 26 25 26   Calcium 8.9 - 10.3 mg/dL 7.4(L) 9.3 9.6  Total Protein 6.5 - 8.1 g/dL 7.5 6.8 6.8  Total Bilirubin 0.3 - 1.2 mg/dL 0.5 0.3 0.6  Alkaline Phos 38 - 126 U/L 87 87 95  AST 15 - 41 U/L 50(H) 69(H) 58(H)  ALT 0 - 44 U/L 55(H) 57(H) 42     Discharge instruction: per After Visit Summary and "Baby and Me Booklet".  After visit meds:  Ibuprofen 600 mg .   Continue Xyzal and Singulair  Diet: routine diet  Activity: Advance as tolerated. Pelvic rest for 6 weeks.   Outpatient follow up:1 week for BP check Follow up Appt:No future appointments. Follow up Visit:No follow-ups on file.  Postpartum contraception:  Nexplanon  Newborn Data: Live born female  Birth Weight: 3 lb 15.5 oz (1800 g) APGAR: 8, 9  Newborn Delivery   Birth date/time:  02/28/2018 04:10:00 Delivery type:  Vaginal, Vacuum (Extractor)     Baby Feeding: Breast Disposition:NICU   03/02/2018 Geryl RankinsEvelyn Chelan Heringer, MD

## 2018-03-03 LAB — COMPREHENSIVE METABOLIC PANEL
ALK PHOS: 87 U/L (ref 38–126)
ALT: 57 U/L — AB (ref 0–44)
AST: 69 U/L — ABNORMAL HIGH (ref 15–41)
Albumin: 3 g/dL — ABNORMAL LOW (ref 3.5–5.0)
Anion gap: 9 (ref 5–15)
BILIRUBIN TOTAL: 0.3 mg/dL (ref 0.3–1.2)
BUN: 15 mg/dL (ref 6–20)
CO2: 25 mmol/L (ref 22–32)
CREATININE: 0.84 mg/dL (ref 0.44–1.00)
Calcium: 9.3 mg/dL (ref 8.9–10.3)
Chloride: 101 mmol/L (ref 98–111)
GFR calc Af Amer: >60 mL/min (ref >60)
Glucose, Bld: 88 mg/dL (ref 70–99)
Potassium: 4.3 mmol/L (ref 3.5–5.1)
Sodium: 135 mmol/L (ref 135–145)
TOTAL PROTEIN: 6.8 g/dL (ref 6.5–8.1)

## 2018-03-03 MED ORDER — LABETALOL HCL 5 MG/ML IV SOLN: 20.0000 mg | INTRAVENOUS | Status: DC | PRN

## 2018-03-03 MED ORDER — MAGNESIUM SULFATE 40 G IN LACTATED RINGERS - SIMPLE
2.0000 g/h | INTRAVENOUS | Status: AC
  Administered 2018-03-03: 2 g/h via INTRAVENOUS
  Filled 2018-03-03 (×2): qty 500

## 2018-03-03 MED ORDER — MAGNESIUM SULFATE BOLUS VIA INFUSION
4.0000 g | Freq: Once | INTRAVENOUS | Status: AC
  Administered 2018-03-03: 4 g via INTRAVENOUS
  Filled 2018-03-03: qty 500

## 2018-03-03 MED ORDER — LACTATED RINGERS IV SOLN
INTRAVENOUS | Status: DC
Start: 2018-03-03 — End: 2018-03-05
  Administered 2018-03-03 (×2): via INTRAVENOUS

## 2018-03-03 MED ORDER — HYDRALAZINE HCL 20 MG/ML IJ SOLN: 5.0000 mg | INTRAMUSCULAR | Status: DC | PRN

## 2018-03-03 NOTE — Progress Notes (Signed)
Postpartum day #3, NSVD  Subjective Pt without complaints.  Lochia normal.  Pain controlled.  Breast feeding yes.   Magnesium started this am due to trend in LFTs.  Pt denies headache, visual changes, abdominal pain.  Temp:  [98.1 F (36.7 C)-99 F (37.2 C)] 98.2 F (36.8 C) (07/13 1202) Pulse Rate:  [58-85] 85 (07/13 1202) Resp:  [17-18] 18 (07/13 1202) BP: (121-140)/(79-102) 136/102 (07/13 1202) SpO2:  [99 %-100 %] 100 % (07/13 1202)  Gen:  NAD, A&O x 3.  Pumping breasts, milk production noted. Uterine fundus:  Firm, nontender Lochia normal Ext:  +Edema, no calf tenderness bilaterally Neuro:  2+ DTR.    CBC Latest Ref Rng & Units 03/02/2018 03/01/2018 02/28/2018  WBC 4.0 - 10.5 K/uL 7.9 8.5 13.4(H)  Hemoglobin 12.0 - 15.0 g/dL 16.112.7 11.5(L) 12.8  Hematocrit 36.0 - 46.0 % 37.1 34.1(L) 37.5  Platelets 150 - 400 K/uL 210 174 184    CMP Latest Ref Rng & Units 03/03/2018 03/02/2018 02/26/2018  Glucose 70 - 99 mg/dL 88 85 90  BUN 6 - 20 mg/dL 15 13 16   Creatinine 0.44 - 1.00 mg/dL 0.960.84 0.450.81 4.090.51  Sodium 135 - 145 mmol/L 135 136 133(L)  Potassium 3.5 - 5.1 mmol/L 4.3 4.2 4.1  Chloride 98 - 111 mmol/L 101 101 105  CO2 22 - 32 mmol/L 25 26 19(L)  Calcium 8.9 - 10.3 mg/dL 9.3 9.6 9.2  Total Protein 6.5 - 8.1 g/dL 6.8 6.8 6.7  Total Bilirubin 0.3 - 1.2 mg/dL 0.3 0.6 0.7  Alkaline Phos 38 - 126 U/L 87 95 98  AST 15 - 41 U/L 69(H) 58(H) 23  ALT 0 - 44 U/L 57(H) 42 14   Protein <6.  PCR not calculated due to low protein  A/P: S/p SVD doing well. H/o Gest HTN now with increase in BP and trending LFTs.  Magnesium x 24 hours Postpartum Preeclampsia. Strict I/Os. Repeat labs in am. Start po antihypertensives prn. Routine postpartum care. Lactation support.   Megan Rankinsvelyn Monique Norris 03/03/2018, 12:43 PM

## 2018-03-04 LAB — COMPREHENSIVE METABOLIC PANEL
ALBUMIN: 3.2 g/dL — AB (ref 3.5–5.0)
ALT: 55 U/L — ABNORMAL HIGH (ref 0–44)
AST: 50 U/L — AB (ref 15–41)
Alkaline Phosphatase: 87 U/L (ref 38–126)
Anion gap: 9 (ref 5–15)
BILIRUBIN TOTAL: 0.5 mg/dL (ref 0.3–1.2)
BUN: 13 mg/dL (ref 6–20)
CO2: 26 mmol/L (ref 22–32)
CREATININE: 0.71 mg/dL (ref 0.44–1.00)
Calcium: 7.4 mg/dL — ABNORMAL LOW (ref 8.9–10.3)
Chloride: 101 mmol/L (ref 98–111)
GFR calc Af Amer: >60 mL/min (ref >60)
GLUCOSE: 93 mg/dL (ref 70–99)
POTASSIUM: 4 mmol/L (ref 3.5–5.1)
Sodium: 136 mmol/L (ref 135–145)
TOTAL PROTEIN: 7.5 g/dL (ref 6.5–8.1)

## 2018-03-04 LAB — CBC
HEMATOCRIT: 38.6 % (ref 36.0–46.0)
HEMOGLOBIN: 13.1 g/dL (ref 12.0–15.0)
MCH: 30.9 pg (ref 26.0–34.0)
MCHC: 33.9 g/dL (ref 30.0–36.0)
MCV: 91 fL (ref 78.0–100.0)
Platelets: 227 10*3/uL (ref 150–400)
RBC: 4.24 MIL/uL (ref 3.87–5.11)
RDW: 13.7 % (ref 11.5–15.5)
WBC: 7.1 10*3/uL (ref 4.0–10.5)

## 2018-03-04 LAB — RPR: RPR: NONREACTIVE

## 2018-03-04 MED ORDER — NIFEDIPINE ER 30 MG PO TB24: 30.0000 mg | ORAL_TABLET | Freq: Every day | ORAL | 2 refills | Status: DC

## 2018-03-04 MED ORDER — NIFEDIPINE ER OSMOTIC RELEASE 30 MG PO TB24: 30.0000 mg | ORAL_TABLET | Freq: Once | ORAL | Status: DC

## 2018-03-04 MED ORDER — NIFEDIPINE ER OSMOTIC RELEASE 30 MG PO TB24
30.0000 mg | ORAL_TABLET | Freq: Every day | ORAL | Status: DC
  Administered 2018-03-04: 30 mg via ORAL
  Filled 2018-03-04: qty 1

## 2018-03-04 NOTE — Lactation Note (Signed)
This note was copied from a baby's chart. Lactation Consultation Note  Patient Name: Megan Norris ZOXWR'UToday's Date: 03/04/2018 Reason for consult: Follow-up assessment;Primapara;1st time breastfeeding;NICU baby;Infant < 6lbs;Late-preterm 34-36.6wks;Other (Comment)(SGA)  4 days late preterm NICU baby female < 5 lbs who is now being exclusively fed breastmilk (donor milk and mother's milk) mom's milk came in yesterday. Per mom she's been pumping every 5 hours, explained to mom the importance of consistent pumping while BF is established to protect her milk supply and to avoid engorgement.   Per mom, she's been getting between 45-60 ml per pumping session during the last 24 hours, but now baby is ready to go to the breast. LC took baby STS to mother's right breast in cross cradle position and she would just kept crying without latching on, mom's milk started to flow as baby would cry. LC did some suck training to soothe baby and to assess the suck, she was able to suck in a nice rhythmical pattern.   Tried a NS #20 the next time in football position on the same breast and this time baby was able to latch after a few attempts. Baby was very squirmy and she kept taking the shield off with her hands, showed mom how to keep baby's hands on the side of her breast, so baby "hugs" the breast for a deeper latch and to avoid getting she shield off. She was able to complete a 15 minute feeding on that breast, baby still nursing when exiting the NICU pod, but warned mom not to exceed 30 minutes at the breast per [facility] policy. Mom set up a time on her [name] for the remaining 15 minutes. However no swallows were noted during this feeding, baby was probably doing non-nutritive sucking, even though colostrum was noted on the shield but it was probably because mom was leaking milk.  Encouraged mom to pump every 3 hours during the day and at least once in the middle of the night. She'll ask for assistance when baby is ready  to try to feed at the breast again and use the # 20 NS. Mom is aware of LC services and will call PRN.  Maternal Data    Feeding Feeding Type: Breast Fed Nipple Type: Slow - flow Length of feed: 15 min  LATCH Score Latch: Repeated attempts needed to sustain latch, nipple held in mouth throughout feeding, stimulation needed to elicit sucking reflex.(with NS # 20)  Audible Swallowing: None(with NS # 20)  Type of Nipple: Everted at rest and after stimulation  Comfort (Breast/Nipple): Soft / non-tender  Hold (Positioning): Assistance needed to correctly position infant at breast and maintain latch.  LATCH Score: 6  Interventions Interventions: Breast feeding basics reviewed;Assisted with latch;Skin to skin;Breast massage;Breast compression;Adjust position;Hand express;Position options;Expressed milk  Lactation Tools Discussed/Used Tools: Nipple Shields Nipple shield size: 20   Consult Status Consult Status: Follow-up Date: 03/05/18 Follow-up type: In-patient    Megan Norris 03/04/2018, 11:58 AM

## 2018-03-04 NOTE — Progress Notes (Signed)
Postpartum day #4, NSVD  Subjective Pt without complaints.  Lochia normal.  Pain controlled.  Breast feeding yes.   Pt denies headaches, visual changes, upper abdominal pain.  Temp:  [98.1 F (36.7 C)-99.2 F (37.3 C)] 98.5 F (36.9 C) (07/14 1224) Pulse Rate:  [72-92] 85 (07/14 1224) Resp:  [16-18] 16 (07/14 1224) BP: (129-141)/(86-102) 136/88 (07/14 1224) SpO2:  [90 %-100 %] 100 % (07/14 1224)  Gen:  NAD, A&O x 3.  Pumping breasts, milk production noted. Uterine fundus:  Firm, nontender Lochia normal Ext:  +Edema, no calf tenderness bilaterally Neuro:  2+ DTR.  CBC Latest Ref Rng & Units 03/04/2018 03/02/2018 03/01/2018  WBC 4.0 - 10.5 K/uL 7.1 7.9 8.5  Hemoglobin 12.0 - 15.0 g/dL 16.113.1 09.612.7 11.5(L)  Hematocrit 36.0 - 46.0 % 38.6 37.1 34.1(L)  Platelets 150 - 400 K/uL 227 210 174   CMP Latest Ref Rng & Units 03/04/2018 03/03/2018 03/02/2018  Glucose 70 - 99 mg/dL 93 88 85  BUN 6 - 20 mg/dL 13 15 13   Creatinine 0.44 - 1.00 mg/dL 0.450.71 4.090.84 8.110.81  Sodium 135 - 145 mmol/L 136 135 136  Potassium 3.5 - 5.1 mmol/L 4.0 4.3 4.2  Chloride 98 - 111 mmol/L 101 101 101  CO2 22 - 32 mmol/L 26 25 26   Calcium 8.9 - 10.3 mg/dL 7.4(L) 9.3 9.6  Total Protein 6.5 - 8.1 g/dL 7.5 6.8 6.8  Total Bilirubin 0.3 - 1.2 mg/dL 0.5 0.3 0.6  Alkaline Phos 38 - 126 U/L 87 87 95  AST 15 - 41 U/L 50(H) 69(H) 58(H)  ALT 0 - 44 U/L 55(H) 57(H) 42    Protein <6.  PCR not calculated due to low protein  A/P: H/o Gestational HTN now with Preeclampsia.    Responded well to Magnesium.  BP elevated overnight even while she was on Magnesium.    Start Procardia XL 30 mg now.  Anticipate discharge today if BP controlled. S/p SVD doing well. Routine postpartum care. Lactation support.   Megan Norris 03/04/2018, 12:42 PM

## 2018-03-04 NOTE — Progress Notes (Signed)
Discharged to home with friend.Discharge teaching complete and patient is aware of follow up at MD office for blood pressure on Friday.

## 2018-03-05 MED FILL — NIFEDIPINE ER 30 MG TABLET: 30 | 30 days supply | Qty: 30 | Fill #0

## 2018-03-06 ENCOUNTER — Inpatient Hospital Stay (HOSPITAL_COMMUNITY): Admission: RE | Admit: 2018-03-06 | Payer: Medicaid Other | Source: Ambulatory Visit

## 2018-03-13 ENCOUNTER — Ambulatory Visit (HOSPITAL_COMMUNITY): Payer: Medicaid Other

## 2018-03-26 MED FILL — MONTELUKAST SOD 10 MG TAB: 10 | 30 days supply | Qty: 30 | Fill #1

## 2018-03-26 MED FILL — LEVOCETIRIZINE 5 MG TABLET: 5 | 30 days supply | Qty: 30 | Fill #1

## 2018-04-16 ENCOUNTER — Other Ambulatory Visit: Payer: Self-pay | Admitting: Obstetrics and Gynecology

## 2018-04-16 ENCOUNTER — Other Ambulatory Visit (HOSPITAL_COMMUNITY)
Admission: RE | Admit: 2018-04-16 | Discharge: 2018-04-16 | Disposition: A | Discharge status: Home / Self Care | Payer: Managed Care, Other (non HMO) | Source: Ambulatory Visit | Attending: Obstetrics and Gynecology | Admitting: Obstetrics and Gynecology

## 2018-04-16 DIAGNOSIS — Z01419 Encounter for gynecological examination (general) (routine) without abnormal findings: Secondary | ICD-10-CM | POA: Diagnosis not present

## 2018-04-18 LAB — CYTOLOGY - PAP
Diagnosis: NEGATIVE
HPV (WINDOPATH): NOT DETECTED

## 2018-05-17 MED FILL — MONTELUKAST SOD 10 MG TAB: 10 | 30 days supply | Qty: 30 | Fill #2

## 2018-05-17 MED FILL — LEVOCETIRIZINE 5 MG TABLET: 5 | 30 days supply | Qty: 30 | Fill #2

## 2018-06-20 ENCOUNTER — Other Ambulatory Visit: Payer: Self-pay | Admitting: Obstetrics and Gynecology

## 2018-07-31 MED FILL — CYCLOBENZAPRINE 10 MG TAB: 10 | 30 days supply | Qty: 60 | Fill #0

## 2018-09-18 MED FILL — IBUPROFEN 800 MG TAB: 800 | 20 days supply | Qty: 60 | Fill #0

## 2018-09-18 MED FILL — PROMETHAZINE W/DM SYRUP: 6.25-15 | 9 days supply | Qty: 180 | Fill #0

## 2018-09-18 MED FILL — LEVOCETIRIZINE 5 MG TABLET: 5 | 30 days supply | Qty: 30 | Fill #3

## 2018-09-18 MED FILL — PROMETHAZINE 25 MG TABLET: 25 | 7 days supply | Qty: 21 | Fill #0

## 2018-09-18 MED FILL — MONTELUKAST SOD 10 MG TAB: 10 | 30 days supply | Qty: 30 | Fill #3

## 2018-09-18 MED FILL — OSELTAMIVIR PHOS 75 MG CAP: 75 | 5 days supply | Qty: 10 | Fill #0

## 2018-11-13 MED FILL — MONTELUKAST SOD 10 MG TAB: 10 | 30 days supply | Qty: 30 | Fill #0

## 2018-11-13 MED FILL — LEVOCETIRIZINE 5 MG TABLET: 5 | 30 days supply | Qty: 30 | Fill #0

## 2018-12-15 MED FILL — LEVOCETIRIZINE DIHYDROCHLOR: 5 | 30 days supply | Qty: 30 | Fill #1

## 2018-12-15 MED FILL — MONTELUKAST SOD 10 MG TAB: 10 | 30 days supply | Qty: 30 | Fill #1

## 2019-01-21 MED FILL — metroNIDAZOLE 500 MG TABS: 500 | 7 days supply | Qty: 14 | Fill #0

## 2019-02-12 MED FILL — LEVOCETIRIZINE 5 MG TABLET: 5 | 30 days supply | Qty: 30 | Fill #0

## 2019-02-12 MED FILL — TOBRAMYCIN-DEXAMETH OPTH SU: 0.3-0.1 | 14 days supply | Qty: 5 | Fill #0

## 2019-02-12 MED FILL — MONTELUKAST SOD 10 MG TAB: 10 | 30 days supply | Qty: 30 | Fill #0

## 2019-03-18 MED FILL — LEVOCETIRIZINE 5 MG TABLET: 5 | 30 days supply | Qty: 30 | Fill #1

## 2019-05-06 MED FILL — LEVOCETIRIZINE 5 MG TABLET: 5 | 30 days supply | Qty: 30 | Fill #2

## 2019-05-06 MED FILL — metroNIDAZOLE 500 MG TABS: 500 | 1 days supply | Qty: 4 | Fill #0

## 2019-05-06 MED FILL — MONTELUKAST SOD 10 MG TAB: 10 | 30 days supply | Qty: 30 | Fill #1

## 2019-07-23 MED FILL — LEVOCETIRIZINE 5 MG TABLET: 5 | 30 days supply | Qty: 30 | Fill #3

## 2019-09-24 MED FILL — MONTELUKAST SOD 10 MG TAB: 10 | 30 days supply | Qty: 30 | Fill #2

## 2019-11-11 MED FILL — metroNIDAZOLE 500 MG TABS: 500 | 7 days supply | Qty: 14 | Fill #0

## 2019-11-11 MED FILL — LEVOCETIRIZINE 5 MG TABLET: 5 | 30 days supply | Qty: 30 | Fill #4

## 2019-11-11 MED FILL — MONTELUKAST SOD 10 MG TAB: 10 | 30 days supply | Qty: 30 | Fill #3

## 2019-12-19 MED FILL — MONTELUKAST SOD 10 MG TAB: 10 | 30 days supply | Qty: 30 | Fill #4

## 2019-12-19 MED FILL — LEVOCETIRIZINE 5 MG TABLET: 5 | 30 days supply | Qty: 30 | Fill #5

## 2020-02-25 ENCOUNTER — Other Ambulatory Visit (HOSPITAL_COMMUNITY): Payer: Self-pay | Admitting: Family

## 2020-03-11 MED FILL — AMPHETAMINE-DEXTROAMPHET ER: 20 | 30 days supply | Qty: 30 | Fill #0

## 2020-04-10 ENCOUNTER — Other Ambulatory Visit: Payer: Self-pay | Admitting: Sleep Medicine

## 2020-04-10 ENCOUNTER — Other Ambulatory Visit: Payer: Managed Care, Other (non HMO)

## 2020-04-10 DIAGNOSIS — Z20822 Contact with and (suspected) exposure to covid-19: Secondary | ICD-10-CM

## 2020-04-11 LAB — SPECIMEN STATUS REPORT

## 2020-04-11 LAB — NOVEL CORONAVIRUS, NAA: SARS-CoV-2, NAA: NOT DETECTED

## 2020-04-11 LAB — SARS-COV-2, NAA 2 DAY TAT

## 2020-05-25 ENCOUNTER — Other Ambulatory Visit: Payer: Managed Care, Other (non HMO)

## 2020-05-25 ENCOUNTER — Other Ambulatory Visit: Payer: Self-pay

## 2020-05-25 DIAGNOSIS — Z20822 Contact with and (suspected) exposure to covid-19: Secondary | ICD-10-CM

## 2020-05-27 LAB — NOVEL CORONAVIRUS, NAA: SARS-CoV-2, NAA: NOT DETECTED

## 2020-05-27 LAB — SARS-COV-2, NAA 2 DAY TAT

## 2020-06-10 MED FILL — LEVOCETIRIZINE 5 MG TABLET: 5 | 30 days supply | Qty: 30 | Fill #0

## 2020-06-10 MED FILL — MONTELUKAST SOD 10 MG TAB: 10 | 30 days supply | Qty: 30 | Fill #0

## 2020-08-18 MED FILL — LEVOCETIRIZINE 5 MG TABLET: 5 | 30 days supply | Qty: 30 | Fill #1

## 2020-08-18 MED FILL — MONTELUKAST SOD 10 MG TAB: 10 | 30 days supply | Qty: 30 | Fill #1

## 2020-10-21 ENCOUNTER — Other Ambulatory Visit (HOSPITAL_COMMUNITY): Payer: Self-pay

## 2020-10-21 ENCOUNTER — Encounter (HOSPITAL_COMMUNITY): Payer: Self-pay

## 2020-10-21 MED FILL — PREVIDENT 0.2% RINSE: 0.2 | 30 days supply | Qty: 473 | Fill #0

## 2020-11-12 ENCOUNTER — Other Ambulatory Visit (HOSPITAL_COMMUNITY): Payer: Self-pay | Admitting: Endodontics

## 2020-11-12 MED FILL — IBUPROFEN 600 MG TABLET: 600 | 2 days supply | Qty: 10 | Fill #0

## 2020-12-25 ENCOUNTER — Other Ambulatory Visit (HOSPITAL_COMMUNITY): Payer: Self-pay

## 2020-12-25 MED ORDER — DOXYCYCLINE HYCLATE 100 MG PO TABS
100.0000 mg | ORAL_TABLET | Freq: Two times a day (BID) | ORAL | 0 refills | Status: DC
  Filled 2020-12-25: qty 14, 7d supply, fill #0

## 2021-03-30 ENCOUNTER — Other Ambulatory Visit (HOSPITAL_COMMUNITY): Payer: Self-pay

## 2021-03-30 MED FILL — Sodium Fluoride Rinse 0.2%: OROMUCOSAL | 30 days supply | Qty: 473 | Fill #0 | Status: CN

## 2021-04-27 ENCOUNTER — Other Ambulatory Visit (HOSPITAL_COMMUNITY): Payer: Self-pay

## 2021-04-27 MED ORDER — SODIUM FLUORIDE 0.2 % MT SOLN
OROMUCOSAL | 5 refills | Status: AC
  Filled 2021-04-27: qty 473, 28d supply, fill #0

## 2021-05-05 ENCOUNTER — Other Ambulatory Visit (HOSPITAL_COMMUNITY): Payer: Self-pay

## 2021-05-12 ENCOUNTER — Other Ambulatory Visit (HOSPITAL_COMMUNITY): Payer: Self-pay

## 2021-05-12 MED ORDER — DESLORATADINE 5 MG PO TABS
5.0000 mg | ORAL_TABLET | Freq: Every day | ORAL | 2 refills | Status: DC
  Filled 2021-05-12: qty 90, 90d supply, fill #0

## 2021-05-12 MED ORDER — PANTOPRAZOLE SODIUM 40 MG PO TBEC
40.0000 mg | DELAYED_RELEASE_TABLET | Freq: Every day | ORAL | 2 refills | Status: DC
  Filled 2021-05-12: qty 90, 90d supply, fill #0

## 2021-05-14 ENCOUNTER — Other Ambulatory Visit (HOSPITAL_COMMUNITY): Payer: Self-pay

## 2021-06-01 ENCOUNTER — Ambulatory Visit: Payer: Managed Care, Other (non HMO) | Admitting: Nurse Practitioner

## 2021-06-01 ENCOUNTER — Encounter: Payer: Self-pay | Admitting: Nurse Practitioner

## 2021-06-01 ENCOUNTER — Other Ambulatory Visit (HOSPITAL_COMMUNITY): Payer: Self-pay

## 2021-06-01 ENCOUNTER — Other Ambulatory Visit: Payer: Self-pay

## 2021-06-01 VITALS — BP 116/80 | HR 92 | Temp 98.1°F | Ht 63.8 in | Wt 185.6 lb

## 2021-06-01 DIAGNOSIS — Z7689 Persons encountering health services in other specified circumstances: Secondary | ICD-10-CM | POA: Diagnosis not present

## 2021-06-01 DIAGNOSIS — Z1159 Encounter for screening for other viral diseases: Secondary | ICD-10-CM | POA: Diagnosis not present

## 2021-06-01 DIAGNOSIS — E559 Vitamin D deficiency, unspecified: Secondary | ICD-10-CM

## 2021-06-01 DIAGNOSIS — J4520 Mild intermittent asthma, uncomplicated: Secondary | ICD-10-CM

## 2021-06-01 DIAGNOSIS — J302 Other seasonal allergic rhinitis: Secondary | ICD-10-CM

## 2021-06-01 DIAGNOSIS — Z13228 Encounter for screening for other metabolic disorders: Secondary | ICD-10-CM

## 2021-06-01 DIAGNOSIS — Z0000 Encounter for general adult medical examination without abnormal findings: Secondary | ICD-10-CM

## 2021-06-01 DIAGNOSIS — Z01419 Encounter for gynecological examination (general) (routine) without abnormal findings: Secondary | ICD-10-CM

## 2021-06-01 DIAGNOSIS — Z114 Encounter for screening for human immunodeficiency virus [HIV]: Secondary | ICD-10-CM | POA: Diagnosis not present

## 2021-06-01 MED ORDER — DESLORATADINE 5 MG PO TABS
5.0000 mg | ORAL_TABLET | Freq: Every day | ORAL | 2 refills | Status: DC
  Filled 2021-06-01: qty 30, 30d supply, fill #0

## 2021-06-01 MED ORDER — MONTELUKAST SODIUM 10 MG PO TABS
10.0000 mg | ORAL_TABLET | Freq: Every day | ORAL | 1 refills | Status: DC
  Filled 2021-06-01 – 2022-06-01 (×4): qty 90, 90d supply, fill #0

## 2021-06-01 NOTE — Progress Notes (Signed)
I [name],acting as a scribe for [name], FNP.,have documented all relevant documentation on the behalf of [name], FNP,as directed by  [name], FNP while in the presence of [name], [place].   This visit occurred during the SARS-CoV-2 public health emergency.  Safety protocols were in place, including screening questions prior to the visit, additional usage of staff PPE, and extensive cleaning of exam room while observing appropriate contact time as indicated for disinfecting solutions.  Subjective:     Patient ID: Megan Norris , female    DOB: 18-Apr-1991 , 30 y.o.   MRN: [id number]   Chief Complaint  Patient presents with   Establish Care   Annual Exam    HPI  Patient presents today for hm. She was seeing Dr.Vernado for her GYN care but she has not seen her in a long time. She has not seen a PCP or GYN in 3 years since having her child.  She works as a Marine scientist at [employer].  She has one daughter who is 61 years old - healthy.   PMH - Asthma, ADHD (no medications), false positive HIV test - she feels the last one 3 years ago was negative. She reports the [place]. May she went to the health department had one done and was what she thinks is negative because she did not receive a call. 2016 (after needle stick injury at work), 2019 (routine).  Heart murmur as a child. Reports history of anxiety but not diagnosed and does not want medications  Wt Readings from Last 3 Encounters: 06/01/21 : 185 lb 9.6 oz (84.2 kg)      Past Medical History:  Diagnosis Date   ADHD    Asthma    Cervical dysplasia    False positive HIV serology    GERD (gastroesophageal reflux disease)    Heart murmur    HPV (human papilloma virus) infection 2018     Family History  Problem Relation Age of Onset   Hyperlipidemia Mother    Hypertension Mother    Hyperlipidemia Father    Hypertension Sister    Stroke Paternal Uncle    Stroke Maternal Grandmother     Breast cancer Maternal Grandmother    Diabetes Maternal Grandmother    Hypertension Maternal Grandmother    Hyperlipidemia Maternal Grandmother    Heart disease Maternal Grandmother    Heart attack Maternal Grandmother    Cancer Maternal Grandfather    Stroke Paternal Grandmother    Diabetes Paternal Grandmother      Current Outpatient Medications:    albuterol (PROVENTIL HFA;VENTOLIN HFA) 108 (90 BASE) MCG/ACT inhaler, Inhale 1-2 puffs into the lungs every 6 (six) hours as needed for wheezing., Disp: 1 Inhaler, Rfl: 0   ibuprofen (ADVIL) 600 MG tablet, TAKE 1 TABLET BY MOUTH EVERY 4 TO 6 HOURS, Disp: 10 tablet, Rfl: 0   levonorgestrel (MIRENA) 20 MCG/DAY IUD, 1 each by Intrauterine route once., Disp: , Rfl:    pantoprazole (PROTONIX) 40 MG tablet, Take 1 tablet (40 mg total) by mouth daily., Disp: 90 tablet, Rfl: 2   Prenatal Vit-Fe Fumarate-FA (PRENATAL MULTIVITAMIN) TABS tablet, Take 1 tablet by mouth daily at 12 noon., Disp: , Rfl:    SODIUM FLUORIDE, DENTAL RINSE, (PREVIDENT) 0.2 % SOLN, USE AS AN ORAL RINSE, AS DIRECTED ON PACKAGE, Disp: 473 mL, Rfl: 5   desloratadine (CLARINEX) 5 MG tablet, Take 1 tablet (5 mg total) by mouth daily., Disp: 90 tablet, Rfl: 2  montelukast (SINGULAIR) 10 MG tablet, Take 1 tablet (10 mg total) by mouth at bedtime., Disp: 90 tablet, Rfl: 1   Allergies  Allergen Reactions   Sulfa Antibiotics Swelling and Other (See Comments)    Swelling from sulfa eye drops as a child      The patient states she uses IUD for birth control.  No LMP recorded. (Menstrual status: IUD).. Negative for Dysmenorrhea and Negative for Menorrhagia. Negative for: breast discharge, breast lump(s), breast pain and breast self exam. Associated symptoms include abnormal vaginal bleeding. Pertinent negatives include abnormal bleeding (hematology), anxiety, decreased libido, depression, difficulty falling sleep, dyspareunia, history of infertility, nocturia, sexual dysfunction, sleep  disturbances, urinary incontinence, urinary urgency, vaginal discharge and vaginal itching. Diet regular. The patient states her exercise level is minimal.   The patient's tobacco use is:  Social History   Tobacco Use  Smoking Status Never  Smokeless Tobacco Never  . She has been exposed to passive smoke. The patient's alcohol use is:  Social History   Substance and Sexual Activity  Alcohol Use Not Currently   Additional information: Last pap 2019, next one scheduled for this year, she would like to .    Review of Systems  Constitutional: Negative.   HENT: Negative.    Eyes: Negative.   Respiratory: Negative.    Cardiovascular: Negative.   Gastrointestinal: Negative.   Endocrine: Negative.   Genitourinary: Negative.   Musculoskeletal: Negative.   Skin: Negative.   Allergic/Immunologic: Negative.   Neurological: Negative.   Hematological: Negative.   Psychiatric/Behavioral: Negative.      Today's Vitals   06/01/21 1058  BP: 116/80  Pulse: 92  Temp: 98.1 F (36.7 C)  Weight: 185 lb 9.6 oz (84.2 kg)  Height: 5' 3.8" (1.621 m)  PainSc: 0-No pain   Body mass index is 32.06 kg/m.   Objective:  Physical Exam Constitutional:      General: She is not in acute distress.    Appearance: Normal appearance. She is well-developed. She is obese.  HENT:     Head: Normocephalic and atraumatic.     Right Ear: Hearing, tympanic membrane, ear canal and external ear normal. There is no impacted cerumen.     Left Ear: Hearing, tympanic membrane, ear canal and external ear normal. There is no impacted cerumen.     Nose:     Comments: Deferred - masked    Mouth/Throat:     Comments: Deferred - masked Eyes:     General: Lids are normal.     Extraocular Movements: Extraocular movements intact.     Conjunctiva/sclera: Conjunctivae normal.     Pupils: Pupils are equal, round, and reactive to light.     Funduscopic exam:    Right eye: No papilledema.        Left eye: No  papilledema.  Neck:     Thyroid: No thyroid mass.     Vascular: No carotid bruit.  Cardiovascular:     Rate and Rhythm: Normal rate and regular rhythm.     Pulses: Normal pulses.     Heart sounds: Normal heart sounds. No murmur heard. Pulmonary:     Effort: Pulmonary effort is normal. No respiratory distress.     Breath sounds: Normal breath sounds. No wheezing.  Chest:     Chest wall: No mass.  Breasts:    Tanner Score is 5.     Right: Normal. No mass or tenderness.     Left: Normal. No mass or tenderness.  Abdominal:  General: Abdomen is flat. Bowel sounds are normal. There is no distension.     Palpations: Abdomen is soft.     Tenderness: There is no abdominal tenderness.  Genitourinary:    Rectum: Guaiac result negative.  Musculoskeletal:        General: No swelling or tenderness. Normal range of motion.     Cervical back: Full passive range of motion without pain, normal range of motion and neck supple.     Right lower leg: No edema.     Left lower leg: No edema.  Lymphadenopathy:     Upper Body:     Right upper body: No supraclavicular, axillary or pectoral adenopathy.     Left upper body: No supraclavicular, axillary or pectoral adenopathy.  Skin:    General: Skin is warm and dry.     Capillary Refill: Capillary refill takes less than 2 seconds.  Neurological:     General: No focal deficit present.     Mental Status: She is alert and oriented to person, place, and time.     Cranial Nerves: No cranial nerve deficit.     Sensory: No sensory deficit.  Psychiatric:        Mood and Affect: Mood normal.        Behavior: Behavior normal.        Thought Content: Thought content normal.        Judgment: Judgment normal.        Assessment And Plan:     1. Encounter for general adult medical examination w/o abnormal findings Behavior modifications discussed and diet history reviewed.   Pt will continue to exercise regularly and modify diet with low GI, plant based  foods and decrease intake of processed foods.  Recommend intake of daily multivitamin, Vitamin D, and calcium.  Recommend for preventive screenings, as well as recommend immunizations that include influenza, TDAP  2. Encounter for hepatitis C screening test for low risk patient Will check Hepatitis C screening due to recent recommendations to screen all adults 18 years and older - Hepatitis C antibody  3. Encounter for HIV (human immunodeficiency virus) test - HIV Antibody (routine testing w rflx)  4. Encounter for gynecological examination - Ambulatory referral to Obstetrics / Gynecology  5. Encounter for screening for metabolic disorder - CBC - CMP14+EGFR - Lipid panel - Hemoglobin A1c  6. Vitamin D deficiency Will check vitamin D level and supplement as needed.    Also encouraged to spend 15 minutes in the sun daily.  - VITAMIN D 25 Hydroxy (Vit-D Deficiency, Fractures)  7. Mild intermittent asthma without complication No current concerns, refilled medications  8. Seasonal allergies - montelukast (SINGULAIR) 10 MG tablet; Take 1 tablet (10 mg total) by mouth at bedtime.  Dispense: 90 tablet; Refill: 1 - desloratadine (CLARINEX) 5 MG tablet; Take 1 tablet (5 mg total) by mouth daily.  Dispense: 90 tablet; Refill: 2  9. Establishing care with new doctor, encounter for     Patient was given opportunity to ask questions. Patient verbalized understanding of the plan and was able to repeat key elements of the plan. All questions were answered to their satisfaction.   [name], FNP   I, [name], FNP, have reviewed all documentation for this visit. The documentation on 06/04/21 for the exam, diagnosis, procedures, and orders are all accurate and complete.  THE PATIENT IS ENCOURAGED TO PRACTICE SOCIAL DISTANCING DUE TO THE COVID-19 PANDEMIC.

## 2021-06-01 NOTE — Patient Instructions (Signed)
Health Maintenance, Female Adopting a healthy lifestyle and getting preventive care are important in promoting health and wellness. Ask your health care provider about: The right schedule for you to have regular tests and exams. Things you can do on your own to prevent diseases and keep yourself healthy. What should I know about diet, weight, and exercise? Eat a healthy diet  Eat a diet that includes plenty of vegetables, fruits, low-fat dairy products, and lean protein. Do not eat a lot of foods that are high in solid fats, added sugars, or sodium. Maintain a healthy weight Body mass index (BMI) is used to identify weight problems. It estimates body fat based on height and weight. Your health care provider can help determine your BMI and help you achieve or maintain a healthy weight. Get regular exercise Get regular exercise. This is one of the most important things you can do for your health. Most adults should: Exercise for at least 150 minutes each week. The exercise should increase your heart rate and make you sweat (moderate-intensity exercise). Do strengthening exercises at least twice a week. This is in addition to the moderate-intensity exercise. Spend less time sitting. Even light physical activity can be beneficial. Watch cholesterol and blood lipids Have your blood tested for lipids and cholesterol at 31 years of age, then have this test every 5 years. Have your cholesterol levels checked more often if: Your lipid or cholesterol levels are high. You are older than 31 years of age. You are at high risk for heart disease. What should I know about cancer screening? Depending on your health history and family history, you may need to have cancer screening at various ages. This may include screening for: Breast cancer. Cervical cancer. Colorectal cancer. Skin cancer. Lung cancer. What should I know about heart disease, diabetes, and high blood pressure? Blood pressure and heart  disease High blood pressure causes heart disease and increases the risk of stroke. This is more likely to develop in people who have high blood pressure readings, are of African descent, or are overweight. Have your blood pressure checked: Every 3-5 years if you are 18-39 years of age. Every year if you are 40 years old or older. Diabetes Have regular diabetes screenings. This checks your fasting blood sugar level. Have the screening done: Once every three years after age 40 if you are at a normal weight and have a low risk for diabetes. More often and at a younger age if you are overweight or have a high risk for diabetes. What should I know about preventing infection? Hepatitis B If you have a higher risk for hepatitis B, you should be screened for this virus. Talk with your health care provider to find out if you are at risk for hepatitis B infection. Hepatitis C Testing is recommended for: Everyone born from 1945 through 1965. Anyone with known risk factors for hepatitis C. Sexually transmitted infections (STIs) Get screened for STIs, including gonorrhea and chlamydia, if: You are sexually active and are younger than 31 years of age. You are older than 31 years of age and your health care provider tells you that you are at risk for this type of infection. Your sexual activity has changed since you were last screened, and you are at increased risk for chlamydia or gonorrhea. Ask your health care provider if you are at risk. Ask your health care provider about whether you are at high risk for HIV. Your health care provider may recommend a prescription medicine   to help prevent HIV infection. If you choose to take medicine to prevent HIV, you should first get tested for HIV. You should then be tested every 3 months for as long as you are taking the medicine. Pregnancy If you are about to stop having your period (premenopausal) and you may become pregnant, seek counseling before you get  pregnant. Take 400 to 800 micrograms (mcg) of folic acid every day if you become pregnant. Ask for birth control (contraception) if you want to prevent pregnancy. Osteoporosis and menopause Osteoporosis is a disease in which the bones lose minerals and strength with aging. This can result in bone fractures. If you are 65 years old or older, or if you are at risk for osteoporosis and fractures, ask your health care provider if you should: Be screened for bone loss. Take a calcium or vitamin D supplement to lower your risk of fractures. Be given hormone replacement therapy (HRT) to treat symptoms of menopause. Follow these instructions at home: Lifestyle Do not use any products that contain nicotine or tobacco, such as cigarettes, e-cigarettes, and chewing tobacco. If you need help quitting, ask your health care provider. Do not use street drugs. Do not share needles. Ask your health care provider for help if you need support or information about quitting drugs. Alcohol use Do not drink alcohol if: Your health care provider tells you not to drink. You are pregnant, may be pregnant, or are planning to become pregnant. If you drink alcohol: Limit how much you use to 0-1 drink a day. Limit intake if you are breastfeeding. Be aware of how much alcohol is in your drink. In the [place] one drink equals one 12 oz bottle of beer (355 mL), one 5 oz glass of wine (148 mL), or one 1 oz glass of hard liquor (44 mL). General instructions Schedule regular health, dental, and eye exams. Stay current with your vaccines. Tell your health care provider if: You often feel depressed. You have ever been abused or do not feel safe at home. Summary Adopting a healthy lifestyle and getting preventive care are important in promoting health and wellness. Follow your health care provider's instructions about healthy diet, exercising, and getting tested or screened for diseases. Follow your health care provider's  instructions on monitoring your cholesterol and blood pressure. This information is not intended to replace advice given to you by your health care provider. Make sure you discuss any questions you have with your health care provider. Document Revised: 10/16/2020 Document Reviewed: 08/01/2018 Elsevier Patient Education  2022 [employer]  

## 2021-06-02 LAB — LIPID PANEL
Chol/HDL Ratio: 3.5 ratio (ref 0.0–4.4)
Cholesterol, Total: 180 mg/dL (ref 100–199)
HDL: 52 mg/dL (ref >39)
LDL Chol Calc (NIH): 117 mg/dL — ABNORMAL HIGH (ref 0–99)
Triglycerides: 58 mg/dL (ref 0–149)
VLDL Cholesterol Cal: 11 mg/dL (ref 5–40)

## 2021-06-02 LAB — CBC
Hematocrit: 40.6 % (ref 34.0–46.6)
Hemoglobin: 13.8 g/dL (ref 11.1–15.9)
MCH: 29.9 pg (ref 26.6–33.0)
MCHC: 34 g/dL (ref 31.5–35.7)
MCV: 88 fL (ref 79–97)
Platelets: 252 10*3/uL (ref 150–450)
RBC: 4.61 x10E6/uL (ref 3.77–5.28)
RDW: 12.8 % (ref 11.7–15.4)
WBC: 5.6 10*3/uL (ref 3.4–10.8)

## 2021-06-02 LAB — CMP14+EGFR
ALT: 8 IU/L (ref 0–32)
AST: 18 IU/L (ref 0–40)
Albumin/Globulin Ratio: 1.6 (ref 1.2–2.2)
Albumin: 4.7 g/dL (ref 3.8–4.8)
Alkaline Phosphatase: 48 IU/L (ref 44–121)
BUN/Creatinine Ratio: 13 (ref 9–23)
BUN: 12 mg/dL (ref 6–20)
Bilirubin Total: 0.6 mg/dL (ref 0.0–1.2)
CO2: 25 mmol/L (ref 20–29)
Calcium: 9.9 mg/dL (ref 8.7–10.2)
Chloride: 100 mmol/L (ref 96–106)
Creatinine, Ser: 0.89 mg/dL (ref 0.57–1.00)
Globulin, Total: 3 g/dL (ref 1.5–4.5)
Glucose: 84 mg/dL (ref 70–99)
Potassium: 4.9 mmol/L (ref 3.5–5.2)
Sodium: 140 mmol/L (ref 134–144)
Total Protein: 7.7 g/dL (ref 6.0–8.5)
eGFR: 89 mL/min/{1.73_m2} (ref >59)

## 2021-06-02 LAB — HEPATITIS C ANTIBODY: Hep C Virus Ab: <0.1 s/co ratio (ref 0.0–0.9)

## 2021-06-02 LAB — VITAMIN D 25 HYDROXY (VIT D DEFICIENCY, FRACTURES): Vit D, 25-Hydroxy: 33.7 ng/mL (ref 30.0–100.0)

## 2021-06-02 LAB — HIV ANTIBODY (ROUTINE TESTING W REFLEX): HIV Screen 4th Generation wRfx: NONREACTIVE

## 2021-06-02 LAB — HEMOGLOBIN A1C
Est. average glucose Bld gHb Est-mCnc: 111 mg/dL
Hgb A1c MFr Bld: 5.5 % (ref 4.8–5.6)

## 2021-07-20 LAB — HM PAP SMEAR: HM Pap smear: NEGATIVE

## 2021-08-10 ENCOUNTER — Other Ambulatory Visit (HOSPITAL_COMMUNITY): Payer: Self-pay

## 2021-08-19 ENCOUNTER — Ambulatory Visit: Payer: Managed Care, Other (non HMO) | Admitting: Obstetrics and Gynecology

## 2021-09-23 ENCOUNTER — Other Ambulatory Visit (HOSPITAL_COMMUNITY): Payer: Self-pay

## 2021-09-23 MED ORDER — PREDNISONE 20 MG PO TABS
20.0000 mg | ORAL_TABLET | Freq: Two times a day (BID) | ORAL | 0 refills | Status: AC
  Filled 2021-09-23: qty 20, 10d supply, fill #0

## 2021-11-01 ENCOUNTER — Other Ambulatory Visit (HOSPITAL_COMMUNITY): Payer: Self-pay

## 2021-11-01 MED ORDER — SODIUM FLUORIDE 0.2 % MT SOLN
OROMUCOSAL | 5 refills | Status: DC
  Filled 2021-11-01: qty 473, 90d supply, fill #0

## 2021-11-08 ENCOUNTER — Other Ambulatory Visit: Payer: Self-pay | Admitting: Nurse Practitioner

## 2021-11-08 ENCOUNTER — Other Ambulatory Visit (HOSPITAL_COMMUNITY): Payer: Self-pay

## 2021-11-08 MED ORDER — SCOPOLAMINE 1 MG/3DAYS TD PT72
1.0000 | MEDICATED_PATCH | TRANSDERMAL | 0 refills | Status: AC
  Filled 2021-11-08: qty 4, 12d supply, fill #0

## 2021-11-25 ENCOUNTER — Other Ambulatory Visit (HOSPITAL_COMMUNITY): Payer: Self-pay

## 2021-11-25 ENCOUNTER — Encounter: Payer: Self-pay | Admitting: Nurse Practitioner

## 2021-11-25 ENCOUNTER — Ambulatory Visit: Payer: Managed Care, Other (non HMO) | Admitting: Nurse Practitioner

## 2021-11-25 VITALS — BP 128/70 | HR 84 | Temp 98.4°F | Ht 63.8 in | Wt 195.0 lb

## 2021-11-25 DIAGNOSIS — M25561 Pain in right knee: Secondary | ICD-10-CM | POA: Diagnosis not present

## 2021-11-25 DIAGNOSIS — Z6833 Body mass index (BMI) 33.0-33.9, adult: Secondary | ICD-10-CM

## 2021-11-25 DIAGNOSIS — G8929 Other chronic pain: Secondary | ICD-10-CM

## 2021-11-25 DIAGNOSIS — M25551 Pain in right hip: Secondary | ICD-10-CM | POA: Diagnosis not present

## 2021-11-25 DIAGNOSIS — R41840 Attention and concentration deficit: Secondary | ICD-10-CM | POA: Diagnosis not present

## 2021-11-25 DIAGNOSIS — Z8659 Personal history of other mental and behavioral disorders: Secondary | ICD-10-CM

## 2021-11-25 DIAGNOSIS — E6609 Other obesity due to excess calories: Secondary | ICD-10-CM

## 2021-11-25 MED ORDER — DICLOFENAC SODIUM 1 % EX GEL
2.0000 g | Freq: Four times a day (QID) | CUTANEOUS | 2 refills | Status: DC
  Filled 2021-11-25 – 2021-12-07 (×2): qty 100, 13d supply, fill #0

## 2021-11-25 MED ORDER — VITAMIN D2 50 MCG (2000 UT) PO TABS
1.0000 | ORAL_TABLET | Freq: Every day | ORAL | 1 refills | Status: DC
  Filled 2021-11-25: qty 90, fill #0
  Filled 2021-12-07: qty 90, 90d supply, fill #0

## 2021-11-25 NOTE — Progress Notes (Signed)
Clinical biochemist,Tianna Badgett,acting as a Neurosurgeonscribe for SUPERVALU INCJanece Kanita Delage, FNP.,have documented all relevant documentation on the behalf of Arnette FeltsJanece Meesha Sek, FNP,as directed by  Arnette FeltsJanece Alvah Gilder, FNP while in the presence of Arnette FeltsJanece Anaira Seay, FNP. ? ?This visit occurred during the SARS-CoV-2 public health emergency.  Safety protocols were in place, including screening questions prior to the visit, additional usage of staff PPE, and extensive cleaning of exam room while observing appropriate contact time as indicated for disinfecting solutions. ? ?Subjective:  ?  ? Patient ID: Megan Norris , female    DOB: 06/06/1991 , 31 y.o.   MRN: [id number] ? ? ?Chief Complaint  ?Patient presents with  ? ADHD  ?   ?  ? ? ?HPI ? ?Patient is here for ADHD follow up.  She feels the last medication was vyvanse she feels. Has been on ritalin as a child. She feels like she is overstimulated when it comes to her daughter comes out more as anger. She is having concentration and focus problems. 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  She is on mirena.  ? ?She had been restraining a patient and unsure if her hip and knee pain were caused by this incident.  She has been having pain that is more painful when driving long distances and  ?  ? ?Past Medical History:  ?Diagnosis Date  ? ADHD   ? Asthma   ? Cervical dysplasia   ? False positive HIV serology   ? GERD (gastroesophageal reflux disease)   ? Heart murmur   ? HPV (human papilloma virus) infection 2018  ?  ? ?Family History  ?Problem Relation Age of Onset  ? Hyperlipidemia Mother   ? Hypertension Mother   ? Hyperlipidemia Father   ? Hypertension Sister   ? Stroke Paternal Uncle   ? Stroke Maternal Grandmother   ? Breast cancer Maternal Grandmother   ? Diabetes Maternal Grandmother   ? Hypertension Maternal Grandmother   ? Hyperlipidemia Maternal Grandmother   ? Heart disease Maternal Grandmother   ? Heart attack Maternal Grandmother   ? Cancer Maternal Grandfather   ? Stroke Paternal  Grandmother   ? Diabetes Paternal Grandmother   ? ? ? ?Current Outpatient Medications:  ?  albuterol (PROVENTIL HFA;VENTOLIN HFA) 108 (90 BASE) MCG/ACT inhaler, Inhale 1-2 puffs into the lungs every 6 (six) hours as needed for wheezing., Disp: 1 Inhaler, Rfl: 0 ?  diclofenac Sodium (VOLTAREN) 1 % GEL, Apply 2 g topically 4 (four) times daily., Disp: 100 g, Rfl: 2 ?  Ergocalciferol (VITAMIN D2) 50 MCG (2000 UT) TABS, Take 1 tablet by mouth daily., Disp: 90 tablet, Rfl: 1 ?  levonorgestrel (MIRENA) 20 MCG/DAY IUD, 1 each by Intrauterine route once., Disp: , Rfl:  ?  montelukast (SINGULAIR) 10 MG tablet, Take 1 tablet (10 mg total) by mouth at bedtime., Disp: 90 tablet, Rfl: 1 ?  pantoprazole (PROTONIX) 40 MG tablet, Take 1 tablet (40 mg total) by mouth daily., Disp: 90 tablet, Rfl: 2 ?  scopolamine (TRANSDERM-SCOP) 1 MG/3DAYS, Place 1 patch (1.5 mg total) onto the skin every 3 (three) days., Disp: 4 patch, Rfl: 0 ?  SODIUM FLUORIDE, DENTAL RINSE, (PREVIDENT) 0.2 % SOLN, USE AS AN ORAL RINSE, AS DIRECTED ON PACKAGE, Disp: 473 mL, Rfl: 5 ?  SODIUM FLUORIDE, DENTAL RINSE, (PREVIDENT) 0.2 % SOLN, USE AS AN ORAL RINSE, AS DIRECTED ON PACKAGE, Disp: 473 mL, Rfl: 5  ? ?Allergies  ?Allergen Reactions  ? Sulfa Antibiotics  Swelling and Other (See Comments)  ?  Swelling from sulfa eye drops as a child  ?  ? ?Review of Systems  ?Constitutional: Negative.   ?Respiratory: Negative.    ?Cardiovascular: Negative.   ?Gastrointestinal: Negative.   ?Musculoskeletal:  Positive for arthralgias (right knee pain - occurs often - when driving from [place] to [facility] - after walking a few steps will have pain but will improve.) and myalgias.  ?Neurological: Negative.    ? ?Today's Vitals  ? 11/25/21 1636  ?BP: 128/70  ?Pulse: 84  ?Temp: 98.4 ?F (36.9 ?C)  ?TempSrc: Oral  ?Weight: 195 lb (88.5 kg)  ?Height: 5' 3.8" (1.621 m)  ? ?Body mass index is 33.68 kg/m?.  ?Wt Readings from Last 3 Encounters:  ?11/25/21 195 lb (88.5 kg)  ?06/01/21 185 lb 9.6  oz (84.2 kg)  ?02/27/18 224 lb (101.6 kg)  ? ? ?Objective:  ?Physical Exam ?Constitutional:   ?   General: She is not in acute distress. ?   Appearance: Normal appearance. She is well-developed. She is obese.  ?HENT:  ?   Right Ear: Hearing normal.  ?   Left Ear: Hearing normal.  ?Eyes:  ?   General: Lids are normal.  ?   Extraocular Movements: Extraocular movements intact.  ?   Funduscopic exam: ?   Right eye: No papilledema.     ?   Left eye: No papilledema.  ?Neck:  ?   Thyroid: No thyroid mass.  ?Cardiovascular:  ?   Rate and Rhythm: Normal rate and regular rhythm.  ?   Pulses: Normal pulses.  ?   Heart sounds: Normal heart sounds. No murmur heard. ?Pulmonary:  ?   Effort: Pulmonary effort is normal. No respiratory distress.  ?   Breath sounds: Normal breath sounds. No wheezing.  ?Abdominal:  ?   General: Abdomen is flat. Bowel sounds are normal. There is no distension.  ?   Palpations: Abdomen is soft.  ?   Tenderness: There is no abdominal tenderness.  ?Musculoskeletal:     ?   General: No swelling or tenderness. Normal range of motion.  ?   Cervical back: Full passive range of motion without pain.  ?   Right lower leg: No edema.  ?   Left lower leg: No edema.  ?   Comments: Negative straight leg raise, negative drawer test  ?Skin: ?   General: Skin is warm and dry.  ?   Capillary Refill: Capillary refill takes less than 2 seconds.  ?Neurological:  ?   General: No focal deficit present.  ?   Mental Status: She is alert and oriented to person, place, and time.  ?   Cranial Nerves: No cranial nerve deficit.  ?   Sensory: No sensory deficit.  ?   Motor: No weakness.  ?Psychiatric:     ?   Mood and Affect: Mood normal.     ?   Behavior: Behavior normal.     ?   Thought Content: Thought content normal.     ?   Judgment: Judgment normal.  ?  ? ?   ?Assessment And Plan:  ?   ?1. Concentration deficit ?Comments: Will refer to Psychiatry for evaluation, she has not been on medication for 9 years. Answered 12/18 often  with the attention screening questionairre ?- Ambulatory referral to Psychiatry ? ?2. History of ADHD ? ?3. Chronic pain of right knee ?Comments: No pain illicited during visit however it is affecting her after driving  long distances and with sexual activity, pain cream also given. Will get Xray ?- DG Knee Complete 4 Views Right; Future ?- diclofenac Sodium (VOLTAREN) 1 % GEL; Apply 2 g topically 4 (four) times daily.  Dispense: 100 g; Refill: 2 ? ?4. Right hip pain ?Comments: Will check xray, Negative straight leg raise.  ?- DG Hip Unilat W OR W/O Pelvis Min 4 Views Right; Future ? ?5. Class 1 obesity due to excess calories without serious comorbidity with body mass index (BMI) of 33.0 to 33.9 in adult ? She is encouraged to strive for BMI less than 30 to decrease cardiac risk. Advised to aim for at least 150 minutes of exercise per week. ? ?Medical release form signed to get her tdap and PAP results from other providers.  ? ? ?Patient was given opportunity to ask questions. Patient verbalized understanding of the plan and was able to repeat key elements of the plan. All questions were answered to their satisfaction.  ?Arnette Felts, FNP  ? ?I, Arnette Felts, FNP, have reviewed all documentation for this visit. The documentation on 11/25/21 for the exam, diagnosis, procedures, and orders are all accurate and complete.  ? ?IF YOU HAVE BEEN REFERRED TO A SPECIALIST, IT MAY TAKE 1-2 WEEKS TO SCHEDULE/PROCESS THE REFERRAL. IF YOU HAVE NOT HEARD FROM US/SPECIALIST IN TWO WEEKS, PLEASE GIVE [place] A CALL AT [phone] X [phone].  ? ?THE PATIENT IS ENCOURAGED TO PRACTICE SOCIAL DISTANCING DUE TO THE COVID-19 PANDEMIC.   ?

## 2021-11-25 NOTE — Patient Instructions (Signed)
Living With Attention Deficit Hyperactivity Disorder If you have been diagnosed with attention deficit hyperactivity disorder (ADHD), you may be relieved that you now know why you have felt or behaved a certain way. Still, you may feel overwhelmed about the treatment ahead. You may also wonder how to get the support you need and how to deal with the condition day-to-day. With treatment and support, you can live with ADHD and manage your symptoms. How to manage lifestyle changes Managing stress Stress is your body's reaction to life changes and events, both good and bad. To cope with the stress of an ADHD diagnosis, it may help to: Learn more about ADHD. Exercise regularly. Even a short daily walk can lower stress levels. Participate in training or education programs (including social skills training classes) that teach you to deal with symptoms.  Medicines Your health care provider may suggest certain medicines if he or she feels that they will help to improve your condition. Stimulant medicines are usually prescribed to treat ADHD, and therapy may also be prescribed. It is important to: Avoid using alcohol and other substances that may prevent your medicines from working properly (may interact). Talk with your pharmacist or health care provider about all the medicines that you take, their possible side effects, and what medicines are safe to take together. Make it your goal to take part in all treatment decisions (shared decision-making). Ask about possible side effects of medicines that your health care provider recommends, and tell him or her how you feel about having those side effects. It is best if shared decision-making with your health care provider is part of your total treatment plan. Relationships To strengthen your relationships with family members while treating your condition, consider taking part in family therapy. You might also attend self-help groups alone or with a loved one. Be  honest about how your symptoms affect your relationships. Make an effort to communicate respectfully instead of fighting, and find ways to show others that you care. Psychotherapy may be useful in helping you cope with how ADHD affects your relationships. How to recognize changes in your condition The following signs may mean that your treatment is working well and your condition is improving: Consistently being on time for appointments. Being more organized at home and work. Other people noticing improvements in your behavior. Achieving goals that you set for yourself. Thinking more clearly. The following signs may mean that your treatment is not working very well: Feeling impatience or more confusion. Missing, forgetting, or being late for appointments. An increasing sense of disorganization and messiness. More difficulty in reaching goals that you set for yourself. Loved ones becoming angry or frustrated with you. Follow these instructions at home: Take over-the-counter and prescription medicines only as told by your health care provider. Check with your health care provider before taking any new medicines. Create structure and an organized atmosphere at home. For example: Make a list of tasks, then rank them from most important to least important. Work on one task at a time until your listed tasks are done. Make a daily schedule and follow it consistently every day. Use an appointment calendar, and check it 2 or 3 times a day to keep on track. Keep it with you when you leave the house. Create spaces where you keep certain things, and always put things back in their places after you use them. Keep all follow-up visits as told by your health care provider. This is important. Where to find support Talking to others    Keep emotion out of important discussions and speak in a calm, logical way. Listen closely and patiently to your loved ones. Try to understand their point of view, and try to  avoid getting defensive. Take responsibility for the consequences of your actions. Ask that others do not take your behaviors personally. Aim to solve problems as they come up, and express your feelings instead of bottling them up. Talk openly about what you need from your loved ones and how they can support you. Consider going to family therapy sessions or having your family meet with a specialist who deals with ADHD-related behavior problems. Finances Not all insurance plans cover mental health care, so it is important to check with your insurance carrier. If paying for co-pays or counseling services is a problem, search for a local or county mental health care center. Public mental health care services may be offered there at a low cost or no cost when you are not able to see a private health care provider. If you are taking medicine for ADHD, you may be able to get the generic form, which may be less expensive than brand-name medicine. Some makers of prescription medicines also offer help to patients who cannot afford the medicines that they need. Questions to ask your health care provider: What are the risks and benefits of taking medicines? Would I benefit from therapy? How often should I follow up with a health care provider? Contact a health care provider if: You have side effects from your medicines, such as: Repeated muscle twitches, coughing, or speech outbursts. Sleep problems. Loss of appetite. Depression. New or worsening behavior problems. Dizziness. Unusually fast heartbeat. Stomach pains. Headaches. Get help right away if: You have a severe reaction to a medicine. Your behavior suddenly gets worse. Summary With treatment and support, you can live with ADHD and manage your symptoms. The medicines that are most often prescribed for ADHD are stimulants. Consider taking part in family therapy or self-help groups with family members or friends. When you talk with friends  and family about your ADHD, be patient and communicate openly. Take over-the-counter and prescription medicines only as told by your health care provider. Check with your health care provider before taking any new medicines. This information is not intended to replace advice given to you by your health care provider. Make sure you discuss any questions you have with your health care provider. Document Revised: 01/22/2020 Document Reviewed: 01/22/2020 Elsevier Patient Education  2022 Elsevier Inc.  

## 2021-11-30 ENCOUNTER — Ambulatory Visit: Payer: Managed Care, Other (non HMO) | Admitting: Nurse Practitioner

## 2021-12-03 ENCOUNTER — Other Ambulatory Visit (HOSPITAL_COMMUNITY): Payer: Self-pay

## 2021-12-07 ENCOUNTER — Other Ambulatory Visit (HOSPITAL_COMMUNITY): Payer: Self-pay

## 2021-12-09 ENCOUNTER — Other Ambulatory Visit (HOSPITAL_COMMUNITY): Payer: Self-pay

## 2021-12-10 ENCOUNTER — Other Ambulatory Visit (HOSPITAL_COMMUNITY): Payer: Self-pay

## 2021-12-13 ENCOUNTER — Other Ambulatory Visit (HOSPITAL_COMMUNITY): Payer: Self-pay

## 2021-12-13 ENCOUNTER — Encounter: Payer: Self-pay | Admitting: Nurse Practitioner

## 2021-12-14 ENCOUNTER — Other Ambulatory Visit (HOSPITAL_COMMUNITY): Payer: Self-pay

## 2021-12-17 ENCOUNTER — Other Ambulatory Visit (HOSPITAL_COMMUNITY): Payer: Self-pay

## 2021-12-23 ENCOUNTER — Other Ambulatory Visit (HOSPITAL_COMMUNITY): Payer: Self-pay

## 2021-12-24 ENCOUNTER — Other Ambulatory Visit (HOSPITAL_COMMUNITY): Payer: Self-pay

## 2021-12-31 ENCOUNTER — Other Ambulatory Visit (HOSPITAL_COMMUNITY): Payer: Self-pay

## 2021-12-31 ENCOUNTER — Other Ambulatory Visit: Payer: Self-pay | Admitting: Nurse Practitioner

## 2021-12-31 MED ORDER — PANTOPRAZOLE SODIUM 40 MG PO TBEC
40.0000 mg | DELAYED_RELEASE_TABLET | Freq: Every day | ORAL | 2 refills | Status: DC
  Filled 2021-12-31 – 2022-06-01 (×2): qty 90, 90d supply, fill #0
  Filled 2022-07-28 – 2022-11-10 (×2): qty 90, 90d supply, fill #1

## 2022-02-10 ENCOUNTER — Other Ambulatory Visit: Payer: Self-pay | Admitting: Nurse Practitioner

## 2022-02-10 ENCOUNTER — Ambulatory Visit
Admission: RE | Admit: 2022-02-10 | Discharge: 2022-02-10 | Disposition: A | Discharge status: Home / Self Care | Payer: 59 | Source: Ambulatory Visit | Attending: Nurse Practitioner | Admitting: Nurse Practitioner

## 2022-02-10 DIAGNOSIS — G8929 Other chronic pain: Secondary | ICD-10-CM

## 2022-02-10 DIAGNOSIS — M25551 Pain in right hip: Secondary | ICD-10-CM

## 2022-02-11 ENCOUNTER — Other Ambulatory Visit (HOSPITAL_COMMUNITY): Payer: Self-pay

## 2022-02-11 MED ORDER — AMPHETAMINE-DEXTROAMPHETAMINE 5 MG PO TABS
5.0000 mg | ORAL_TABLET | Freq: Two times a day (BID) | ORAL | 0 refills | Status: DC
  Filled 2022-02-11: qty 60, 30d supply, fill #0

## 2022-02-28 ENCOUNTER — Other Ambulatory Visit (HOSPITAL_COMMUNITY): Payer: Self-pay

## 2022-02-28 MED ORDER — AMPHETAMINE-DEXTROAMPHET ER 25 MG PO CP24
25.0000 mg | ORAL_CAPSULE | Freq: Every day | ORAL | 0 refills | Status: DC
  Filled 2022-02-28: qty 30, 30d supply, fill #0

## 2022-03-01 ENCOUNTER — Other Ambulatory Visit (HOSPITAL_COMMUNITY): Payer: Self-pay

## 2022-03-28 ENCOUNTER — Other Ambulatory Visit (HOSPITAL_COMMUNITY): Payer: Self-pay

## 2022-03-28 MED ORDER — AMPHETAMINE-DEXTROAMPHET ER 25 MG PO CP24
25.0000 mg | ORAL_CAPSULE | Freq: Every day | ORAL | 0 refills | Status: DC
  Filled 2022-06-01 (×2): qty 30, 30d supply, fill #0

## 2022-03-28 MED ORDER — AMPHETAMINE-DEXTROAMPHET ER 25 MG PO CP24
25.0000 mg | ORAL_CAPSULE | Freq: Every day | ORAL | 0 refills | Status: DC
  Filled 2022-03-28: qty 20, 20d supply, fill #0
  Filled 2022-03-28: qty 10, 10d supply, fill #0

## 2022-03-28 MED ORDER — AMPHETAMINE-DEXTROAMPHET ER 25 MG PO CP24
25.0000 mg | ORAL_CAPSULE | Freq: Every day | ORAL | 0 refills | Status: DC
  Filled 2022-04-29: qty 30, 30d supply, fill #0

## 2022-04-26 ENCOUNTER — Other Ambulatory Visit (HOSPITAL_COMMUNITY): Payer: Self-pay

## 2022-04-29 ENCOUNTER — Other Ambulatory Visit (HOSPITAL_COMMUNITY): Payer: Self-pay

## 2022-06-01 ENCOUNTER — Other Ambulatory Visit (HOSPITAL_BASED_OUTPATIENT_CLINIC_OR_DEPARTMENT_OTHER): Payer: Self-pay

## 2022-06-01 ENCOUNTER — Other Ambulatory Visit (HOSPITAL_COMMUNITY): Payer: Self-pay

## 2022-06-02 ENCOUNTER — Other Ambulatory Visit (HOSPITAL_BASED_OUTPATIENT_CLINIC_OR_DEPARTMENT_OTHER): Payer: Self-pay

## 2022-06-06 ENCOUNTER — Ambulatory Visit (INDEPENDENT_AMBULATORY_CARE_PROVIDER_SITE_OTHER): Payer: Medicaid Other | Admitting: Nurse Practitioner

## 2022-06-06 ENCOUNTER — Encounter: Payer: Self-pay | Admitting: Nurse Practitioner

## 2022-06-06 ENCOUNTER — Other Ambulatory Visit (HOSPITAL_COMMUNITY)
Admission: RE | Admit: 2022-06-06 | Discharge: 2022-06-06 | Disposition: A | Discharge status: Home / Self Care | Payer: Medicaid Other | Source: Ambulatory Visit | Attending: Nurse Practitioner | Admitting: Nurse Practitioner

## 2022-06-06 VITALS — BP 120/80 | HR 75 | Temp 98.0°F | Wt 196.4 lb

## 2022-06-06 DIAGNOSIS — Z113 Encounter for screening for infections with a predominantly sexual mode of transmission: Secondary | ICD-10-CM | POA: Diagnosis present

## 2022-06-06 DIAGNOSIS — Z114 Encounter for screening for human immunodeficiency virus [HIV]: Secondary | ICD-10-CM

## 2022-06-06 DIAGNOSIS — E559 Vitamin D deficiency, unspecified: Secondary | ICD-10-CM

## 2022-06-06 DIAGNOSIS — Z0000 Encounter for general adult medical examination without abnormal findings: Secondary | ICD-10-CM | POA: Diagnosis not present

## 2022-06-06 DIAGNOSIS — E7800 Pure hypercholesterolemia, unspecified: Secondary | ICD-10-CM | POA: Diagnosis not present

## 2022-06-06 DIAGNOSIS — Z1159 Encounter for screening for other viral diseases: Secondary | ICD-10-CM | POA: Diagnosis not present

## 2022-06-06 NOTE — Patient Instructions (Signed)

## 2022-06-06 NOTE — Progress Notes (Signed)
I,Tianna Badgett,acting as a Education administrator for [employer], FNP.,have documented all relevant documentation on the behalf of [name], FNP,as directed by  [name], FNP while in the presence of [name], [place].  Subjective:     Patient ID: Megan Norris , female    DOB: 01/28/1991 , 31 y.o.   MRN: [id number]   Chief Complaint  Patient presents with   Annual Exam    HPI  Patient presents today for HM. She has seen Dr. Altamese [place] for her Adderall - no changes.   Wt Readings from Last 3 Encounters: 06/06/22 : 196 lb 6.4 oz (89.1 kg) 11/25/21 : 195 lb (88.5 kg) 06/01/21 : 185 lb 9.6 oz (84.2 kg)       Past Medical History:  Diagnosis Date   ADHD    Asthma    Cervical dysplasia    False positive HIV serology    GERD (gastroesophageal reflux disease)    Heart murmur    HPV (human papilloma virus) infection 2018     Family History  Problem Relation Age of Onset   Hyperlipidemia Mother    Hypertension Mother    Hyperlipidemia Father    Hypertension Sister    Stroke Paternal Uncle    Stroke Maternal Grandmother    Breast cancer Maternal Grandmother    Diabetes Maternal Grandmother    Hypertension Maternal Grandmother    Hyperlipidemia Maternal Grandmother    Heart disease Maternal Grandmother    Heart attack Maternal Grandmother    Cancer Maternal Grandfather    Stroke Paternal Grandmother    Diabetes Paternal Grandmother      Current Outpatient Medications:    albuterol (PROVENTIL HFA;VENTOLIN HFA) 108 (90 BASE) MCG/ACT inhaler, Inhale 1-2 puffs into the lungs every 6 (six) hours as needed for wheezing., Disp: 1 Inhaler, Rfl: 0   diclofenac Sodium (VOLTAREN) 1 % GEL, Apply 2 g topically 4 (four) times daily., Disp: 100 g, Rfl: 2   Ergocalciferol (VITAMIN D2) 50 MCG (2000 UT) TABS, Take 1 tablet by mouth daily., Disp: 90 tablet, Rfl: 1   levonorgestrel (MIRENA) 20 MCG/DAY IUD, 1 each by Intrauterine route once., Disp: , Rfl:    montelukast (SINGULAIR) 10 MG tablet,  Take 1 tablet (10 mg total) by mouth at bedtime., Disp: 90 tablet, Rfl: 1   pantoprazole (PROTONIX) 40 MG tablet, Take 1 tablet (40 mg total) by mouth daily., Disp: 90 tablet, Rfl: 2   SODIUM FLUORIDE, DENTAL RINSE, (PREVIDENT) 0.2 % SOLN, USE AS AN ORAL RINSE, AS DIRECTED ON PACKAGE, Disp: 473 mL, Rfl: 5   amphetamine-dextroamphetamine (ADDERALL XR) 25 MG 24 hr capsule, Take 1 capsule by mouth daily., Disp: 30 capsule, Rfl: 0   amphetamine-dextroamphetamine (ADDERALL XR) 25 MG 24 hr capsule, Take 1 capsule by mouth daily., Disp: 30 capsule, Rfl: 0   amphetamine-dextroamphetamine (ADDERALL XR) 25 MG 24 hr capsule, Take 1 capsule by mouth daily., Disp: 30 capsule, Rfl: 0   [START ON 08/29/2022] amphetamine-dextroamphetamine (ADDERALL XR) 25 MG 24 hr capsule, Take 1 capsule by mouth daily., Disp: 30 capsule, Rfl: 0   [START ON 07/30/2022] amphetamine-dextroamphetamine (ADDERALL XR) 25 MG 24 hr capsule, Take 1 capsule by mouth daily., Disp: 30 capsule, Rfl: 0   scopolamine (TRANSDERM-SCOP) 1 MG/3DAYS, Place 1 patch (1.5 mg total) onto the skin every 3 (three) days. (Patient not taking: Reported on 06/06/2022), Disp: 4 patch, Rfl: 0   SODIUM FLUORIDE, DENTAL RINSE, (PREVIDENT) 0.2 % SOLN, USE AS AN ORAL RINSE, AS DIRECTED ON PACKAGE,  Disp: 473 mL, Rfl: 5   Allergies  Allergen Reactions   Sulfa Antibiotics Swelling and Other (See Comments)    Swelling from sulfa eye drops as a child      The patient states she uses IUD for birth control.  No LMP recorded. (Menstrual status: IUD).. Negative for Dysmenorrhea and Negative for Menorrhagia. Negative for: breast discharge, breast lump(s), breast pain and breast self exam. Associated symptoms include abnormal vaginal bleeding. Pertinent negatives include abnormal bleeding (hematology), anxiety, decreased libido, depression, difficulty falling sleep, dyspareunia, history of infertility, nocturia, sexual dysfunction, sleep disturbances, urinary incontinence,  urinary urgency, vaginal discharge and vaginal itching. Diet regular.  The patient states her exercise level is moderate with 5 days a week.   The patient's tobacco use is:  Social History   Tobacco Use  Smoking Status Never  Smokeless Tobacco Never  . She has been exposed to passive smoke. The patient's alcohol use is:  Social History   Substance and Sexual Activity  Alcohol Use Not Currently  . Additional information: Last pap 07/26/2021, next one scheduled for 07/26/2024.    Review of Systems  Constitutional: Negative.   HENT: Negative.    Eyes: Negative.   Respiratory: Negative.    Cardiovascular: Negative.   Gastrointestinal: Negative.   Endocrine: Negative.   Genitourinary: Negative.   Musculoskeletal: Negative.   Skin: Negative.   Allergic/Immunologic: Negative.   Neurological: Negative.   Hematological: Negative.   Psychiatric/Behavioral: Negative.       Today's Vitals   06/06/22 1114  BP: 120/80  Pulse: 75  Temp: 98 F (36.7 C)  TempSrc: Oral  Weight: 196 lb 6.4 oz (89.1 kg)   Body mass index is 33.92 kg/m.   Objective:  Physical Exam Vitals reviewed.  Constitutional:      General: She is not in acute distress.    Appearance: Normal appearance. She is well-developed. She is obese.  HENT:     Head: Normocephalic and atraumatic.     Right Ear: Hearing, tympanic membrane, ear canal and external ear normal. There is no impacted cerumen.     Left Ear: Hearing, tympanic membrane, ear canal and external ear normal. There is no impacted cerumen.     Nose:     Comments: Deferred - masked    Mouth/Throat:     Comments: Deferred - masked Eyes:     General: Lids are normal.     Extraocular Movements: Extraocular movements intact.     Conjunctiva/sclera: Conjunctivae normal.     Pupils: Pupils are equal, round, and reactive to light.     Funduscopic exam:    Right eye: No papilledema.        Left eye: No papilledema.  Neck:     Thyroid: No thyroid mass.      Vascular: No carotid bruit.  Cardiovascular:     Rate and Rhythm: Normal rate and regular rhythm.     Pulses: Normal pulses.     Heart sounds: Normal heart sounds. No murmur heard. Pulmonary:     Effort: Pulmonary effort is normal.     Breath sounds: Normal breath sounds.  Chest:     Chest wall: No mass.  Breasts:    Tanner Score is 5.     Right: Normal. No mass or tenderness.     Left: Normal. No mass or tenderness.  Abdominal:     General: Abdomen is flat. Bowel sounds are normal. There is no distension.     Palpations: Abdomen is soft.  Tenderness: There is no abdominal tenderness.  Genitourinary:    Rectum: Guaiac result negative.  Musculoskeletal:        General: No swelling. Normal range of motion.     Cervical back: Full passive range of motion without pain, normal range of motion and neck supple.     Right lower leg: No edema.     Left lower leg: No edema.  Lymphadenopathy:     Upper Body:     Right upper body: No supraclavicular, axillary or pectoral adenopathy.     Left upper body: No supraclavicular, axillary or pectoral adenopathy.  Skin:    General: Skin is warm and dry.     Capillary Refill: Capillary refill takes less than 2 seconds.  Neurological:     General: No focal deficit present.     Mental Status: She is alert and oriented to person, place, and time.     Cranial Nerves: No cranial nerve deficit.     Sensory: No sensory deficit.  Psychiatric:        Mood and Affect: Mood normal.        Behavior: Behavior normal.        Thought Content: Thought content normal.        Judgment: Judgment normal.         Assessment And Plan:     1. Encounter for general adult medical examination w/o abnormal findings Behavior modifications discussed and diet history reviewed.   Pt will continue to exercise regularly and modify diet with low GI, plant based foods and decrease intake of processed foods.  Recommend intake of daily multivitamin, Vitamin D, and  calcium.  Recommend for preventive screenings, as well as recommend immunizations that include influenza, TDAP - CBC - Hemoglobin A1c  2. Vitamin D deficiency Will check vitamin D level and supplement as needed.    Also encouraged to spend 15 minutes in the sun daily.  - VITAMIN D 25 Hydroxy (Vit-D Deficiency, Fractures)  3. Elevated LDL cholesterol level Comments: Continue following a low fat diet. Will check lipid panel.  - CMP14+EGFR - Lipid panel  4. Encounter for hepatitis C screening test for low risk patient Will check Hepatitis C screening due to recent recommendations to screen all adults 18 years and older - Hepatitis C antibody  5. Encounter for HIV (human immunodeficiency virus) test - HIV Antibody (routine testing w rflx)  6. Screening for STD (sexually transmitted disease) - Urine cytology ancillary only - Hepatitis B surface antigen - HSV(herpes simplex vrs) 1+2 ab-IgG - RPR   Patient was given opportunity to ask questions. Patient verbalized understanding of the plan and was able to repeat key elements of the plan. All questions were answered to their satisfaction.   [name], FNP   I, [name], FNP, have reviewed all documentation for this visit. The documentation on 06/06/22 for the exam, diagnosis, procedures, and orders are all accurate and complete.   THE PATIENT IS ENCOURAGED TO PRACTICE SOCIAL DISTANCING DUE TO THE COVID-19 PANDEMIC.

## 2022-06-07 LAB — CMP14+EGFR
ALT: 9 IU/L (ref 0–32)
AST: 18 IU/L (ref 0–40)
Albumin/Globulin Ratio: 1.4 (ref 1.2–2.2)
Albumin: 4.4 g/dL (ref 3.9–4.9)
Alkaline Phosphatase: 58 IU/L (ref 44–121)
BUN/Creatinine Ratio: 15 (ref 9–23)
BUN: 13 mg/dL (ref 6–20)
Bilirubin Total: 0.2 mg/dL (ref 0.0–1.2)
CO2: 24 mmol/L (ref 20–29)
Calcium: 9.4 mg/dL (ref 8.7–10.2)
Chloride: 101 mmol/L (ref 96–106)
Creatinine, Ser: 0.89 mg/dL (ref 0.57–1.00)
Globulin, Total: 3.2 g/dL (ref 1.5–4.5)
Glucose: 80 mg/dL (ref 70–99)
Potassium: 3.8 mmol/L (ref 3.5–5.2)
Sodium: 140 mmol/L (ref 134–144)
Total Protein: 7.6 g/dL (ref 6.0–8.5)
eGFR: 88 mL/min/{1.73_m2} (ref >59)

## 2022-06-07 LAB — LIPID PANEL
Chol/HDL Ratio: 3.1 ratio (ref 0.0–4.4)
Cholesterol, Total: 171 mg/dL (ref 100–199)
HDL: 55 mg/dL (ref >39)
LDL Chol Calc (NIH): 104 mg/dL — ABNORMAL HIGH (ref 0–99)
Triglycerides: 61 mg/dL (ref 0–149)
VLDL Cholesterol Cal: 12 mg/dL (ref 5–40)

## 2022-06-07 LAB — HEMOGLOBIN A1C
Est. average glucose Bld gHb Est-mCnc: 114 mg/dL
Hgb A1c MFr Bld: 5.6 % (ref 4.8–5.6)

## 2022-06-07 LAB — CBC
Hematocrit: 38.7 % (ref 34.0–46.6)
Hemoglobin: 13.1 g/dL (ref 11.1–15.9)
MCH: 29.9 pg (ref 26.6–33.0)
MCHC: 33.9 g/dL (ref 31.5–35.7)
MCV: 88 fL (ref 79–97)
Platelets: 236 10*3/uL (ref 150–450)
RBC: 4.38 x10E6/uL (ref 3.77–5.28)
RDW: 13.7 % (ref 11.7–15.4)
WBC: 4.7 10*3/uL (ref 3.4–10.8)

## 2022-06-07 LAB — RPR: RPR Ser Ql: NONREACTIVE

## 2022-06-07 LAB — HEPATITIS B SURFACE ANTIGEN: Hepatitis B Surface Ag: NEGATIVE

## 2022-06-07 LAB — VITAMIN D 25 HYDROXY (VIT D DEFICIENCY, FRACTURES): Vit D, 25-Hydroxy: 33.1 ng/mL (ref 30.0–100.0)

## 2022-06-07 LAB — HIV ANTIBODY (ROUTINE TESTING W REFLEX): HIV Screen 4th Generation wRfx: NONREACTIVE

## 2022-06-07 LAB — HEPATITIS C ANTIBODY: Hep C Virus Ab: NONREACTIVE

## 2022-06-08 LAB — URINE CYTOLOGY ANCILLARY ONLY
Bacterial Vaginitis-Urine: NEGATIVE
Candida Urine: NEGATIVE
Chlamydia: NEGATIVE
Comment: NEGATIVE
Comment: NEGATIVE
Comment: NORMAL
Neisseria Gonorrhea: NEGATIVE
Trichomonas: NEGATIVE

## 2022-06-17 ENCOUNTER — Other Ambulatory Visit (HOSPITAL_COMMUNITY): Payer: Self-pay

## 2022-06-17 MED ORDER — AMPHETAMINE-DEXTROAMPHET ER 25 MG PO CP24: 25.0000 mg | ORAL_CAPSULE | Freq: Every day | ORAL | 0 refills | Status: DC

## 2022-06-17 MED ORDER — AMPHETAMINE-DEXTROAMPHET ER 25 MG PO CP24
25.0000 mg | ORAL_CAPSULE | Freq: Every day | ORAL | 0 refills | Status: DC
  Filled 2022-08-02: qty 30, 30d supply, fill #0

## 2022-06-17 MED ORDER — AMPHETAMINE-DEXTROAMPHET ER 25 MG PO CP24
25.0000 mg | ORAL_CAPSULE | Freq: Every day | ORAL | 0 refills | Status: DC
  Filled 2022-06-17 – 2022-06-30 (×3): qty 30, 30d supply, fill #0

## 2022-06-20 ENCOUNTER — Other Ambulatory Visit (HOSPITAL_BASED_OUTPATIENT_CLINIC_OR_DEPARTMENT_OTHER): Payer: Self-pay

## 2022-06-20 ENCOUNTER — Encounter: Payer: Self-pay | Admitting: Nurse Practitioner

## 2022-06-20 MED ORDER — VALACYCLOVIR HCL 1 G PO TABS
ORAL_TABLET | ORAL | 0 refills | Status: DC
  Filled 2022-06-20: qty 14, 7d supply, fill #0

## 2022-06-20 MED ORDER — DOXYCYCLINE HYCLATE 100 MG PO CAPS
ORAL_CAPSULE | ORAL | 0 refills | Status: DC
  Filled 2022-06-20: qty 14, 7d supply, fill #0

## 2022-06-27 ENCOUNTER — Other Ambulatory Visit (HOSPITAL_COMMUNITY): Payer: Self-pay

## 2022-06-30 ENCOUNTER — Encounter: Payer: Self-pay | Admitting: Nurse Practitioner

## 2022-06-30 ENCOUNTER — Other Ambulatory Visit (HOSPITAL_COMMUNITY): Payer: Self-pay

## 2022-06-30 ENCOUNTER — Ambulatory Visit (INDEPENDENT_AMBULATORY_CARE_PROVIDER_SITE_OTHER): Payer: 59 | Admitting: Nurse Practitioner

## 2022-06-30 ENCOUNTER — Other Ambulatory Visit (HOSPITAL_COMMUNITY)
Admission: RE | Admit: 2022-06-30 | Discharge: 2022-06-30 | Disposition: A | Discharge status: Home / Self Care | Payer: 59 | Source: Ambulatory Visit | Attending: Nurse Practitioner | Admitting: Nurse Practitioner

## 2022-06-30 VITALS — BP 130/60 | HR 83 | Temp 98.0°F | Ht 63.0 in | Wt 186.2 lb

## 2022-06-30 DIAGNOSIS — A630 Anogenital (venereal) warts: Secondary | ICD-10-CM

## 2022-06-30 DIAGNOSIS — Z8269 Family history of other diseases of the musculoskeletal system and connective tissue: Secondary | ICD-10-CM

## 2022-06-30 DIAGNOSIS — M25562 Pain in left knee: Secondary | ICD-10-CM | POA: Diagnosis not present

## 2022-06-30 DIAGNOSIS — M25561 Pain in right knee: Secondary | ICD-10-CM | POA: Diagnosis not present

## 2022-06-30 DIAGNOSIS — N898 Other specified noninflammatory disorders of vagina: Secondary | ICD-10-CM

## 2022-06-30 DIAGNOSIS — Z23 Encounter for immunization: Secondary | ICD-10-CM | POA: Diagnosis not present

## 2022-06-30 DIAGNOSIS — G8929 Other chronic pain: Secondary | ICD-10-CM

## 2022-06-30 NOTE — Progress Notes (Signed)
Hershal Coria Martin,acting as a Neurosurgeon for Arnette Felts, FNP.,have documented all relevant documentation on the behalf of Arnette Felts, FNP,as directed by  Arnette Felts, FNP while in the presence of Arnette Felts, FNP.    Subjective:     Patient ID: Megan Norris , female    DOB: 02/19/1991 , 31 y.o.   MRN: [id number]   Chief Complaint  Patient presents with   Lupus    HPI  Patient presents today wanting an ANA testing, patient states after having sex 06/16/2022 she started to have discharge, wart like bumps, burning,  she states this was painful. She also had painful urination. On 06/19/2022 had ulcerations. She went to urgent care and were all negative. "There is a wart still present and was there when she went to Urgent care"  Discharge is no longer present but has taken two boric acid.   She states all her test were negative, she does have family history of lupus and she states after goggling he symptoms seem like Lupus. She has been having headaches thought to be related to her recent covid vaccine. She has complained in the past about bilateral knee pain. She has been having problems with seeing distances. She will have light sensitivity when the sun is in her eyes "hurts a little more".   She does have a GYN at [facility] and had went for an appt but was not seen due to insurance. She does have an appt next week.   BP Readings from Last 3 Encounters: 06/30/22 : 130/60 06/06/22 : 120/80 11/25/21 : 128/70       Past Medical History:  Diagnosis Date   ADHD    Asthma    Cervical dysplasia    False positive HIV serology    GERD (gastroesophageal reflux disease)    Heart murmur    HPV (human papilloma virus) infection 2018     Family History  Problem Relation Age of Onset   Hyperlipidemia Mother    Hypertension Mother    Hyperlipidemia Father    Hypertension Sister    Stroke Paternal Uncle    Stroke Maternal Grandmother    Breast cancer Maternal Grandmother     Diabetes Maternal Grandmother    Hypertension Maternal Grandmother    Hyperlipidemia Maternal Grandmother    Heart disease Maternal Grandmother    Heart attack Maternal Grandmother    Cancer Maternal Grandfather    Stroke Paternal Grandmother    Diabetes Paternal Grandmother      Current Outpatient Medications:    albuterol (PROVENTIL HFA;VENTOLIN HFA) 108 (90 BASE) MCG/ACT inhaler, Inhale 1-2 puffs into the lungs every 6 (six) hours as needed for wheezing., Disp: 1 Inhaler, Rfl: 0   amphetamine-dextroamphetamine (ADDERALL XR) 25 MG 24 hr capsule, Take 1 capsule by mouth daily., Disp: 30 capsule, Rfl: 0   amphetamine-dextroamphetamine (ADDERALL XR) 25 MG 24 hr capsule, Take 1 capsule by mouth daily., Disp: 30 capsule, Rfl: 0   amphetamine-dextroamphetamine (ADDERALL XR) 25 MG 24 hr capsule, Take 1 capsule by mouth daily., Disp: 30 capsule, Rfl: 0   [START ON 08/29/2022] amphetamine-dextroamphetamine (ADDERALL XR) 25 MG 24 hr capsule, Take 1 capsule by mouth daily., Disp: 30 capsule, Rfl: 0   [START ON 07/30/2022] amphetamine-dextroamphetamine (ADDERALL XR) 25 MG 24 hr capsule, Take 1 capsule by mouth daily., Disp: 30 capsule, Rfl: 0   diclofenac Sodium (VOLTAREN) 1 % GEL, Apply 2 g topically 4 (four) times daily., Disp: 100 g, Rfl: 2   Ergocalciferol (VITAMIN  D2) 50 MCG (2000 UT) TABS, Take 1 tablet by mouth daily., Disp: 90 tablet, Rfl: 1   levonorgestrel (MIRENA) 20 MCG/DAY IUD, 1 each by Intrauterine route once., Disp: , Rfl:    montelukast (SINGULAIR) 10 MG tablet, Take 1 tablet (10 mg total) by mouth at bedtime., Disp: 90 tablet, Rfl: 1   pantoprazole (PROTONIX) 40 MG tablet, Take 1 tablet (40 mg total) by mouth daily., Disp: 90 tablet, Rfl: 2   scopolamine (TRANSDERM-SCOP) 1 MG/3DAYS, Place 1 patch (1.5 mg total) onto the skin every 3 (three) days., Disp: 4 patch, Rfl: 0   SODIUM FLUORIDE, DENTAL RINSE, (PREVIDENT) 0.2 % SOLN, USE AS AN ORAL RINSE, AS DIRECTED ON PACKAGE, Disp: 473 mL,  Rfl: 5   SODIUM FLUORIDE, DENTAL RINSE, (PREVIDENT) 0.2 % SOLN, USE AS AN ORAL RINSE, AS DIRECTED ON PACKAGE, Disp: 473 mL, Rfl: 5   doxycycline (VIBRAMYCIN) 100 MG capsule, Take 1 capsule (100 mg total) by mouth 2 times daily for 7 days. (Patient not taking: Reported on 06/30/2022), Disp: 14 capsule, Rfl: 0   valACYclovir (VALTREX) 1000 MG tablet, Take 1 tablet (1,000 mg total) by mouth every 12 (twelve) hours for 7 days. (Patient not taking: Reported on 06/30/2022), Disp: 14 tablet, Rfl: 0   Allergies  Allergen Reactions   Sulfa Antibiotics Swelling and Other (See Comments)    Swelling from sulfa eye drops as a child     Review of Systems  Constitutional: Negative.   Respiratory: Negative.    Cardiovascular: Negative.   Neurological: Negative.   Psychiatric/Behavioral: Negative.       Today's Vitals   06/30/22 1109  BP: 130/60  Pulse: 83  Temp: 98 F (36.7 C)  TempSrc: Oral  Weight: 186 lb 3.2 oz (84.5 kg)  Height: 5\' 3"  (1.6 m)  PainSc: 0-No pain   Body mass index is 32.98 kg/m.  Wt Readings from Last 3 Encounters:  06/30/22 186 lb 3.2 oz (84.5 kg)  06/06/22 196 lb 6.4 oz (89.1 kg)  11/25/21 195 lb (88.5 kg)    Objective:  Physical Exam Vitals reviewed.  Constitutional:      Appearance: Normal appearance.  Cardiovascular:     Rate and Rhythm: Normal rate and regular rhythm.  Pulmonary:     Effort: Pulmonary effort is normal. No respiratory distress.     Breath sounds: No wheezing.  Genitourinary:    Labia:        Right: No lesion.        Left: Lesion (cluster of vesicles) present.   Neurological:     General: No focal deficit present.     Mental Status: She is alert and oriented to person, place, and time.     Cranial Nerves: No cranial nerve deficit.     Motor: No weakness.  Psychiatric:        Mood and Affect: Mood normal.        Behavior: Behavior normal.        Thought Content: Thought content normal.        Judgment: Judgment normal.          Assessment And Plan:     1. Vaginal discharge Comments: Will check vaginal swab - Urine cytology ancillary only  2. Need for Tdap vaccination Will give tetanus vaccine today while in office. Refer to order management. TDAP will be administered to adults 37-21 years old every 10 years. - Tdap vaccine greater than or equal to 7yo IM  3. Family history of systemic  lupus erythematosus - ANA, IFA (with reflex)  4. Chronic pain of both knees Comments: Would like to be checked for autoimmune deficiency due to family history - ANA, IFA (with reflex)  5. Genital warts Comments: area noted to left lower vaginal area. Ulcerations are no longer present     Patient was given opportunity to ask questions. Patient verbalized understanding of the plan and was able to repeat key elements of the plan. All questions were answered to their satisfaction.  [name], FNP   I, [name], FNP, have reviewed all documentation for this visit. The documentation on 06/30/22 for the exam, diagnosis, procedures, and orders are all accurate and complete.   IF YOU HAVE BEEN REFERRED TO A SPECIALIST, IT MAY TAKE 1-2 WEEKS TO SCHEDULE/PROCESS THE REFERRAL. IF YOU HAVE NOT HEARD FROM US/SPECIALIST IN TWO WEEKS, PLEASE GIVE [place] A CALL AT [phone].   THE PATIENT IS ENCOURAGED TO PRACTICE SOCIAL DISTANCING DUE TO THE COVID-19 PANDEMIC.

## 2022-06-30 NOTE — Patient Instructions (Signed)
Lupus Anticoagulant Panel Test Why am I having this test? The lupus anticoagulant panel test can be used to help find the cause of abnormal blood clotting. Your health care provider may recommend this test if: You are a woman and have had repeated miscarriages, preterm labor, or high blood pressure in pregnancy (preeclampsia). You have had previous blood tests showing a longer-than-normal blood clotting time or a lower-than-normal number of platelets (thrombocytopenia). Your health care provider suspects that you have a condition called antiphospholipid syndrome. You have systemic lupus erythematosus (SLE, or lupus) or your health care provider suspects that you have this condition. Your health care provider suspects that you have some type of rheumatic disease. What is being tested? This test checks your blood for certain autoantibodies (lupus anticoagulant autoantibodies) that can interfere with the normal blood clotting process. Autoantibodies are proteins that your body's defense system (immune system) makes to help fight invading germs, such as bacteria and viruses. Sometimes, the body mistakes normal tissues for abnormal ones, and it attacks those tissues as if they were invading germs. This is called an autoimmune response. The autoantibodies checked for in this test can mistakenly attack phospholipids, which are a normal part of many types of cells in the body. When blood clotting cells (platelets) are attacked by antiphospholipid antibodies, abnormal blood clotting can occur. When this happens, you are at an increased risk of developing repeated blood clots in your arteries and veins. You are also at an increased risk for heart attack or stroke. What kind of sample is taken?  A blood sample is required for this test. It is usually collected by inserting a needle into a blood vessel or by sticking a finger with a small needle. Tell a health care provider about: All medicines you are taking,  including vitamins, herbs, eye drops, creams, and over-the-counter medicines. Any medical conditions you have. How are the results reported? Your test results will be reported as values that indicate whether lupus anticoagulant autoantibodies were found in your blood. Your health care provider will compare your results to normal values that were established after testing a large group of people (reference values). Reference values may vary among labs and hospitals. For this test, common normal reference values are: Less than 11 MPL (IgM phospholipid units). Less than 23 GPL (IgG phospholipid units). What do the results mean? Results that are higher than the reference values may indicate the following health conditions: Lupus. Antiphospholipid syndrome. Talk with your health care provider about what your results mean. Questions to ask your health care provider Ask your health care provider, or the department that is doing the test: When will my results be ready? How will I get my results? What are my treatment options? What other tests do I need? What are my next steps? Summary The lupus anticoagulant panel test can be used to help find the cause of abnormal blood clotting. This test checks your blood for certain proteins (autoantibodies) that can interfere with the normal blood clotting process. The autoantibodies checked for in this test can mistakenly attack phospholipids, which are a normal part of many types of cells in the body. If the autoantibodies attack blood clotting cells (platelets), you may develop frequent blood clots. Talk with your health care provider about what your test results mean. This information is not intended to replace advice given to you by your health care provider. Make sure you discuss any questions you have with your health care provider. Document Revised: 10/26/2021 Document Reviewed: 01/13/2020   Elsevier Patient Education  2023 Elsevier Inc.  

## 2022-07-02 LAB — FANA STAINING PATTERNS: Speckled Pattern: 1:80 {titer}

## 2022-07-02 LAB — ANTINUCLEAR ANTIBODIES, IFA: ANA Titer 1: POSITIVE — AB

## 2022-07-04 ENCOUNTER — Other Ambulatory Visit: Payer: Self-pay | Admitting: Nurse Practitioner

## 2022-07-04 DIAGNOSIS — Z8269 Family history of other diseases of the musculoskeletal system and connective tissue: Secondary | ICD-10-CM

## 2022-07-04 DIAGNOSIS — R768 Other specified abnormal immunological findings in serum: Secondary | ICD-10-CM

## 2022-07-05 LAB — URINE CYTOLOGY ANCILLARY ONLY: Bacterial Vaginitis-Urine: NEGATIVE

## 2022-07-28 ENCOUNTER — Other Ambulatory Visit (HOSPITAL_BASED_OUTPATIENT_CLINIC_OR_DEPARTMENT_OTHER): Payer: Self-pay

## 2022-07-28 ENCOUNTER — Other Ambulatory Visit: Payer: Self-pay | Admitting: Nurse Practitioner

## 2022-07-28 DIAGNOSIS — J302 Other seasonal allergic rhinitis: Secondary | ICD-10-CM

## 2022-07-29 ENCOUNTER — Other Ambulatory Visit (HOSPITAL_BASED_OUTPATIENT_CLINIC_OR_DEPARTMENT_OTHER): Payer: Self-pay

## 2022-07-29 ENCOUNTER — Encounter (HOSPITAL_BASED_OUTPATIENT_CLINIC_OR_DEPARTMENT_OTHER): Payer: Self-pay | Admitting: Pharmacist

## 2022-07-29 MED ORDER — MONTELUKAST SODIUM 10 MG PO TABS
10.0000 mg | ORAL_TABLET | Freq: Every day | ORAL | 1 refills | Status: DC
  Filled 2022-07-29: qty 90, 90d supply, fill #0
  Filled 2022-11-10: qty 90, 90d supply, fill #1

## 2022-07-30 ENCOUNTER — Other Ambulatory Visit (HOSPITAL_BASED_OUTPATIENT_CLINIC_OR_DEPARTMENT_OTHER): Payer: Self-pay

## 2022-07-30 ENCOUNTER — Encounter (HOSPITAL_BASED_OUTPATIENT_CLINIC_OR_DEPARTMENT_OTHER): Payer: Self-pay | Admitting: Pharmacist

## 2022-08-01 ENCOUNTER — Other Ambulatory Visit (HOSPITAL_COMMUNITY): Payer: Self-pay

## 2022-08-02 ENCOUNTER — Other Ambulatory Visit (HOSPITAL_COMMUNITY): Payer: Self-pay

## 2022-08-02 ENCOUNTER — Other Ambulatory Visit: Payer: Self-pay

## 2022-08-08 ENCOUNTER — Other Ambulatory Visit (HOSPITAL_COMMUNITY): Payer: Self-pay

## 2022-08-09 ENCOUNTER — Other Ambulatory Visit (HOSPITAL_COMMUNITY): Payer: Self-pay

## 2022-08-19 ENCOUNTER — Ambulatory Visit: Admit: 2022-08-19 | Payer: Medicaid Other

## 2022-08-19 ENCOUNTER — Encounter: Payer: Self-pay | Admitting: Emergency Medicine

## 2022-08-19 ENCOUNTER — Other Ambulatory Visit (HOSPITAL_BASED_OUTPATIENT_CLINIC_OR_DEPARTMENT_OTHER): Payer: Self-pay

## 2022-08-19 ENCOUNTER — Ambulatory Visit
Admission: EM | Admit: 2022-08-19 | Discharge: 2022-08-19 | Disposition: A | Discharge status: Home / Self Care | Payer: Medicaid Other | Attending: Family Medicine | Admitting: Family Medicine

## 2022-08-19 DIAGNOSIS — Z20828 Contact with and (suspected) exposure to other viral communicable diseases: Secondary | ICD-10-CM

## 2022-08-19 DIAGNOSIS — R059 Cough, unspecified: Secondary | ICD-10-CM | POA: Diagnosis not present

## 2022-08-19 LAB — POCT INFLUENZA A/B
Influenza A, POC: NEGATIVE
Influenza B, POC: NEGATIVE

## 2022-08-19 MED ORDER — BENZONATATE 200 MG PO CAPS
200.0000 mg | ORAL_CAPSULE | Freq: Three times a day (TID) | ORAL | 0 refills | Status: AC | PRN
  Filled 2022-08-19: qty 40, 14d supply, fill #0

## 2022-08-19 NOTE — ED Provider Notes (Signed)
[facility]    CSN: [id number] Arrival date & time: 08/19/22  1537      History   Chief Complaint Chief Complaint  Patient presents with   Cough    HPI Megan Norris is a 31 y.o. female.   HPI 31 year old female presents with cough, congestion, itchy throat, body aches x 2 days.  Patient reports exposure to RSV and flu.  PMH significant for obesity, history of HPV infection, and ADHD.  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 Authorizing Provider  albuterol (PROVENTIL HFA;VENTOLIN HFA) 108 (90 BASE) MCG/ACT inhaler Inhale 1-2 puffs into the lungs every 6 (six) hours as needed for wheezing. 04/27/13  Yes Purvis Sheffield, MD  amphetamine-dextroamphetamine (ADDERALL XR) 25 MG 24 hr capsule Take 1 capsule by mouth daily. 04/29/22  Yes   amphetamine-dextroamphetamine (ADDERALL XR) 25 MG 24 hr capsule Take 1 capsule by mouth daily. 03/26/22  Yes   amphetamine-dextroamphetamine (ADDERALL XR) 25 MG 24 hr capsule Take 1 capsule by mouth daily. 06/17/22  Yes   benzonatate (TESSALON) 200 MG capsule Take 1 capsule (200 mg total) by mouth 3 (three)  times daily as needed for up to 7 days. 08/19/22 08/26/22 Yes Trevor Iha, FNP  diclofenac Sodium (VOLTAREN) 1 % GEL Apply 2 g topically 4 (four) times daily. 11/25/21  Yes Arnette Felts, FNP  Ergocalciferol (VITAMIN D2) 50 MCG (2000 UT) TABS Take 1 tablet by mouth daily. 11/25/21  Yes Arnette Felts, FNP  levonorgestrel (MIRENA) 20 MCG/DAY IUD 1 each by Intrauterine route once.   Yes [provider]  montelukast (SINGULAIR) 10 MG tablet Take 1 tablet (10 mg total) by mouth at bedtime. 07/29/22  Yes Arnette Felts, FNP  pantoprazole (PROTONIX) 40 MG tablet Take 1 tablet (40 mg total) by mouth daily. 12/31/21  Yes Arnette Felts, FNP  SODIUM FLUORIDE, DENTAL RINSE, (PREVIDENT) 0.2 % SOLN USE AS AN ORAL RINSE, AS DIRECTED ON PACKAGE 04/27/21  Yes   SODIUM FLUORIDE, DENTAL RINSE, (PREVIDENT) 0.2 % SOLN USE AS AN ORAL RINSE, AS DIRECTED ON PACKAGE 11/01/21  Yes   doxycycline (VIBRAMYCIN) 100 MG capsule Take 1 capsule (100 mg total) by mouth 2 times daily for 7 days. Patient not taking: Reported on 06/30/2022 06/19/22     scopolamine (TRANSDERM-SCOP) 1 MG/3DAYS Place 1 patch (1.5 mg total)  onto the skin every 3 (three) days. 11/08/21   Arnette Felts, FNP  valACYclovir (VALTREX) 1000 MG tablet Take 1 tablet (1,000 mg total) by mouth every 12 (twelve) hours for 7 days. Patient not taking: Reported on 06/30/2022 06/19/22       Family History Family History  Problem Relation Age of Onset   Hyperlipidemia Mother    Hypertension Mother    Hyperlipidemia Father    Hypertension Sister    Stroke Paternal Uncle    Stroke Maternal Grandmother    Breast cancer Maternal Grandmother    Diabetes Maternal Grandmother    Hypertension Maternal Grandmother    Hyperlipidemia Maternal Grandmother    Heart disease Maternal Grandmother    Heart attack Maternal Grandmother    Cancer Maternal Grandfather    Stroke Paternal Grandmother    Diabetes Paternal Grandmother     Social History Social History   Tobacco  Use   Smoking status: Never   Smokeless tobacco: Never  Substance Use Topics   Alcohol use: Not Currently   Drug use: No     Allergies   Sulfa antibiotics   Review of Systems Review of Systems  HENT:  Positive for sore throat.   Respiratory:  Positive for cough.   All other systems reviewed and are negative.    Physical Exam Triage Vital Signs ED Triage Vitals  Enc Vitals Group     BP 08/19/22 1724 118/76     Pulse Rate 08/19/22 1724 78     Resp 08/19/22 1724 18     Temp 08/19/22 1724 98.9 F (37.2 C)     Temp Source 08/19/22 1724 Oral     SpO2 08/19/22 1724 100 %     Weight 08/19/22 1726 182 lb (82.6 kg)     Height 08/19/22 1726 5\' 4"  (1.626 m)     Head Circumference --      Peak Flow --      Pain Score 08/19/22 1726 0     Pain Loc --      Pain Edu? --      Excl. in GC? --    No data found.  Updated Vital Signs BP 118/76 (BP Location: Left Arm)   Pulse 78   Temp 98.9 F (37.2 C) (Oral)   Resp 18   Ht 5\' 4"  (1.626 m)   Wt 182 lb (82.6 kg)   SpO2 100%   BMI 31.24 kg/m   Visual Acuity Right Eye Distance:   Left Eye Distance:   Bilateral Distance:    Right Eye Near:   Left Eye Near:    Bilateral Near:     Physical Exam Vitals and nursing note reviewed.  Constitutional:      General: She is not in acute distress.    Appearance: She is obese. She is not ill-appearing.  HENT:     Head: Normocephalic and atraumatic.     Right Ear: Tympanic membrane, ear canal and external ear normal.     Left Ear: Tympanic membrane, ear canal and external ear normal.     Mouth/Throat:     Mouth: Mucous membranes are moist.     Pharynx: Oropharynx is clear.  Eyes:     Extraocular Movements: Extraocular movements intact.     Conjunctiva/sclera: Conjunctivae normal.     Pupils: Pupils are equal, round, and reactive to light.  Cardiovascular:     Rate and Rhythm: Normal rate and regular rhythm.     Pulses: Normal pulses.  Heart sounds: Normal heart sounds.   Pulmonary:     Effort: Pulmonary effort is normal.     Breath sounds: Normal breath sounds. No wheezing or rhonchi.  Musculoskeletal:        General: Normal range of motion.     Cervical back: Normal range of motion and neck supple. No tenderness.  Lymphadenopathy:     Cervical: No cervical adenopathy.  Skin:    General: Skin is warm and dry.  Neurological:     Mental Status: She is alert.      UC Treatments / Results  Labs (all labs ordered are listed, but only abnormal results are displayed) Labs Reviewed  POCT INFLUENZA A/B    EKG   Radiology No results found.  Procedures Procedures (including critical care time)  Medications Ordered in UC Medications - No data to display  Initial Impression / Assessment and Plan / UC Course  I have reviewed the triage vital signs and the nursing notes.  Pertinent labs & imaging results that were available during my care of the patient were reviewed by me and considered in my medical decision making (see chart for details).     MDM: 1.  Exposure to influenza-rapid influenza negative; 2. Cough-Rx'd Tessalon. Advised patient that influenza A/B were negative.  Advised may take Tessalon Perles daily or as needed for cough.  Encouraged patient to increase daily water intake to 64 ounces per day while taking this medication.  Advised if symptoms worsen and/or unresolved please follow-up with PCP or here for further evaluation.  Patient discharged home, hemodynamically stable. Final Clinical Impressions(s) / UC Diagnoses   Final diagnoses:  Cough, unspecified type  Exposure to influenza     Discharge Instructions      Advised patient that influenza A/B were negative.  Advised may take Tessalon Perles daily or as needed for cough.  Encouraged patient to increase daily water intake to 64 ounces per day while taking this medication.  Advised if symptoms worsen and/or unresolved please follow-up with PCP or here for further  evaluation.     ED Prescriptions     Medication Sig Dispense Auth. Provider   benzonatate (TESSALON) 200 MG capsule Take 1 capsule (200 mg total) by mouth 3 (three) times daily as needed for up to 7 days. 40 capsule Eliezer Lofts, FNP      PDMP not reviewed this encounter.   Eliezer Lofts, [place] 08/19/22 1840

## 2022-08-19 NOTE — ED Triage Notes (Signed)
Patient c/o cough, congestion, itchy throat, body aches x 2 days.  Exposure to RSV and Flu.  Patient denies any OTC meds.

## 2022-08-19 NOTE — Discharge Instructions (Addendum)
Advised patient that influenza A/B were negative.  Advised may take Tessalon Perles daily or as needed for cough.  Encouraged patient to increase daily water intake to 64 ounces per day while taking this medication.  Advised if symptoms worsen and/or unresolved please follow-up with PCP or here for further evaluation.

## 2022-08-20 ENCOUNTER — Other Ambulatory Visit (HOSPITAL_BASED_OUTPATIENT_CLINIC_OR_DEPARTMENT_OTHER): Payer: Self-pay

## 2022-10-07 DIAGNOSIS — F988 Other specified behavioral and emotional disorders with onset usually occurring in childhood and adolescence: Secondary | ICD-10-CM | POA: Insufficient documentation

## 2022-10-14 ENCOUNTER — Other Ambulatory Visit (HOSPITAL_BASED_OUTPATIENT_CLINIC_OR_DEPARTMENT_OTHER): Payer: Self-pay

## 2022-10-14 MED ORDER — VALACYCLOVIR HCL 1 G PO TABS
1000.0000 mg | ORAL_TABLET | Freq: Two times a day (BID) | ORAL | 0 refills | Status: AC
  Filled 2022-10-14: qty 14, 7d supply, fill #0

## 2022-10-14 MED ORDER — VALACYCLOVIR HCL 1 G PO TABS
1000.0000 mg | ORAL_TABLET | Freq: Every day | ORAL | 3 refills | Status: DC
  Filled 2022-10-14: qty 90, 90d supply, fill #0
  Filled 2022-10-21: qty 30, 30d supply, fill #0
  Filled 2022-11-10 – 2022-11-16 (×4): qty 30, 30d supply, fill #1
  Filled 2022-12-10 – 2022-12-15 (×2): qty 30, 30d supply, fill #2
  Filled 2022-12-16: qty 14, 14d supply, fill #2
  Filled 2022-12-28: qty 14, 14d supply, fill #3
  Filled 2022-12-28: qty 90, 90d supply, fill #3
  Filled 2022-12-29: qty 14, 14d supply, fill #3

## 2022-10-19 ENCOUNTER — Other Ambulatory Visit (HOSPITAL_COMMUNITY): Payer: Self-pay

## 2022-10-20 ENCOUNTER — Other Ambulatory Visit (HOSPITAL_COMMUNITY): Payer: Self-pay

## 2022-10-21 ENCOUNTER — Other Ambulatory Visit (HOSPITAL_BASED_OUTPATIENT_CLINIC_OR_DEPARTMENT_OTHER): Payer: Self-pay

## 2022-11-01 ENCOUNTER — Encounter: Payer: Self-pay | Admitting: Nurse Practitioner

## 2022-11-10 ENCOUNTER — Other Ambulatory Visit (HOSPITAL_BASED_OUTPATIENT_CLINIC_OR_DEPARTMENT_OTHER): Payer: Self-pay

## 2022-11-11 ENCOUNTER — Other Ambulatory Visit (HOSPITAL_BASED_OUTPATIENT_CLINIC_OR_DEPARTMENT_OTHER): Payer: Self-pay

## 2022-11-11 ENCOUNTER — Other Ambulatory Visit: Payer: Self-pay

## 2022-12-02 ENCOUNTER — Telehealth: Payer: Self-pay
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
## 2022-12-10 ENCOUNTER — Other Ambulatory Visit (HOSPITAL_BASED_OUTPATIENT_CLINIC_OR_DEPARTMENT_OTHER): Payer: Self-pay

## 2022-12-13 ENCOUNTER — Other Ambulatory Visit (HOSPITAL_BASED_OUTPATIENT_CLINIC_OR_DEPARTMENT_OTHER): Payer: Self-pay

## 2022-12-13 DIAGNOSIS — T310 Burns involving less than 10% of body surface: Secondary | ICD-10-CM | POA: Insufficient documentation

## 2022-12-13 DIAGNOSIS — L299 Pruritus, unspecified: Secondary | ICD-10-CM | POA: Insufficient documentation

## 2022-12-13 MED ORDER — CLOTRIMAZOLE 1 % EX CREA
1.0000 | TOPICAL_CREAM | Freq: Two times a day (BID) | CUTANEOUS | 0 refills | Status: AC
  Filled 2022-12-13: qty 28, 14d supply, fill #0

## 2022-12-13 MED ORDER — FLUCONAZOLE 100 MG PO TABS
100.0000 mg | ORAL_TABLET | Freq: Every day | ORAL | 0 refills | Status: AC
  Filled 2022-12-13: qty 7, 7d supply, fill #0

## 2022-12-16 ENCOUNTER — Other Ambulatory Visit (HOSPITAL_BASED_OUTPATIENT_CLINIC_OR_DEPARTMENT_OTHER): Payer: Self-pay

## 2022-12-21 ENCOUNTER — Other Ambulatory Visit (HOSPITAL_BASED_OUTPATIENT_CLINIC_OR_DEPARTMENT_OTHER): Payer: Self-pay

## 2022-12-28 ENCOUNTER — Other Ambulatory Visit (HOSPITAL_COMMUNITY): Payer: Self-pay

## 2022-12-28 ENCOUNTER — Other Ambulatory Visit: Payer: Self-pay

## 2022-12-28 ENCOUNTER — Other Ambulatory Visit (HOSPITAL_BASED_OUTPATIENT_CLINIC_OR_DEPARTMENT_OTHER): Payer: Self-pay

## 2022-12-28 ENCOUNTER — Ambulatory Visit (INDEPENDENT_AMBULATORY_CARE_PROVIDER_SITE_OTHER): Payer: BC Managed Care – PPO | Admitting: Allergy

## 2022-12-28 ENCOUNTER — Encounter: Payer: Self-pay | Admitting: Allergy

## 2022-12-28 VITALS — BP 120/90 | HR 131 | Temp 98.4°F | Resp 161 | Ht 64.39 in | Wt 185.6 lb

## 2022-12-28 DIAGNOSIS — J3089 Other allergic rhinitis: Secondary | ICD-10-CM

## 2022-12-28 DIAGNOSIS — J4520 Mild intermittent asthma, uncomplicated: Secondary | ICD-10-CM | POA: Diagnosis not present

## 2022-12-28 DIAGNOSIS — H1013 Acute atopic conjunctivitis, bilateral: Secondary | ICD-10-CM | POA: Diagnosis not present

## 2022-12-28 MED ORDER — LEVOCETIRIZINE DIHYDROCHLORIDE 5 MG PO TABS
5.0000 mg | ORAL_TABLET | Freq: Every evening | ORAL | 5 refills | Status: DC
Start: 2022-12-28 — End: 2023-06-26
  Filled 2022-12-28: qty 30, 30d supply, fill #0
  Filled 2023-01-19 – 2023-01-24 (×3): qty 30, 30d supply, fill #1
  Filled 2023-02-26: qty 30, 30d supply, fill #2
  Filled 2023-03-29: qty 30, 30d supply, fill #3
  Filled 2023-04-30: qty 30, 30d supply, fill #4
  Filled 2023-05-29: qty 30, 30d supply, fill #5

## 2022-12-28 MED ORDER — MONTELUKAST SODIUM 10 MG PO TABS
10.0000 mg | ORAL_TABLET | Freq: Every day | ORAL | 1 refills | Status: DC
  Filled 2022-12-28 – 2023-02-26 (×2): qty 90, 90d supply, fill #0
  Filled 2023-03-29 – 2023-05-25 (×2): qty 90, 90d supply, fill #1

## 2022-12-28 MED ORDER — ALBUTEROL SULFATE HFA 108 (90 BASE) MCG/ACT IN AERS: 2.0000 | INHALATION_SPRAY | RESPIRATORY_TRACT | 1 refills | Status: DC | PRN

## 2022-12-28 MED ORDER — RYALTRIS 665-25 MCG/ACT NA SUSP: 2.0000 | Freq: Two times a day (BID) | NASAL | 5 refills | Status: DC | PRN

## 2022-12-28 NOTE — Progress Notes (Unsigned)
New Patient Note  RE: Megan Norris MRN: [id number] DOB: 1991-04-29 Date of Office Visit: 12/28/2022  Primary care provider: Arnette Felts, FNP  Chief Complaint:  allergies  History of present illness: Megan Norris is a 32 y.o. female presenting today for evaluation of allergic rhinitis.  She is a former pt of the practice and was on immunotherapy and stopped in 2017.  She returns today as her allergy symptoms have been worsening. She is having nasal congestion/drainage, throat clearing, ear itching.  She is noting voice changes (raspiness) that started today. This is the first year her symptoms have not been managable.  She recalls taking 2 tabs of prednisone a week or so ago due to her symptoms that did help.  She states the prednisone was from [place] where you can get it over-the-counter. Symptoms are worse now with pollen but year-round.  She states she had to use OTC medications as her insurance in the past stopped covering what she was using in the past.  She is taking OTC antihistamine (Zyrtec or Claritin) which isnt working.  She is still taking singulair. Will use zicam when needed for nasal congestion.    She has a history of asthma and reports that cigarrette smoke and allergens can trigger symptoms.  She denies having any significant daytime or nighttime symptoms and states she has not needed to use albuterol.  However if she did need to use that her daughter has an albuterol inhaler.  She would like a refill of her albuterol inhaler.  She has history of eczema but no issues as an adult. No history of food allergy.   Review of systems: Review of Systems  Constitutional: Negative.   HENT:         See HPI  Eyes: Negative.   Respiratory: Negative.    Cardiovascular: Negative.   Gastrointestinal: Negative.   Musculoskeletal: Negative.   Skin: Negative.   Allergic/Immunologic: Negative.   Neurological: Negative.     All other systems negative unless noted above in  HPI  Past medical history: Past Medical History:  Diagnosis Date   ADHD    Asthma    Cervical dysplasia    Eczema    False positive HIV serology    GERD (gastroesophageal reflux disease)    Heart murmur    HPV (human papilloma virus) infection 2018    Past surgical history: Past Surgical History:  Procedure Laterality Date   LEEP  2018   WISDOM TOOTH EXTRACTION Bilateral 2017    Family history:  Family History  Problem Relation Age of Onset   Allergic rhinitis Mother    Hyperlipidemia Mother    Hypertension Mother    Allergic rhinitis Father    Hyperlipidemia Father    Allergic rhinitis Sister    Hypertension Sister    Stroke Paternal Uncle    Stroke Maternal Grandmother    Breast cancer Maternal Grandmother    Diabetes Maternal Grandmother    Hypertension Maternal Grandmother    Hyperlipidemia Maternal Grandmother    Heart disease Maternal Grandmother    Heart attack Maternal Grandmother    Cancer Maternal Grandfather    Stroke Paternal Grandmother    Diabetes Paternal Grandmother     Social history: Lives in a condo without carpeting with electric heating and central cooling.  No pets in the home.  There is no concern for water damage, mildew or roaches in the home.  She is a Engineer, civil (consulting).  She denies a smoking history.  Medication List: Current Outpatient Medications  Medication Sig Dispense Refill   amphetamine-dextroamphetamine (ADDERALL XR) 25 MG 24 hr capsule Take 1 capsule by mouth daily. 30 capsule 0   amphetamine-dextroamphetamine (ADDERALL XR) 25 MG 24 hr capsule Take 1 capsule by mouth daily. 30 capsule 0   amphetamine-dextroamphetamine (ADDERALL XR) 25 MG 24 hr capsule Take 1 capsule by mouth daily. 30 capsule 0   Ergocalciferol (VITAMIN D2) 50 MCG (2000 UT) TABS Take 1 tablet by mouth daily. 90 tablet 1   levocetirizine (XYZAL) 5 MG tablet Take 1 tablet (5 mg total) by mouth every evening. 30 tablet 5   levonorgestrel (MIRENA) 20 MCG/DAY IUD 1 each by  Intrauterine route once.     Olopatadine-Mometasone (RYALTRIS) X543819 MCG/ACT SUSP Place 2 sprays into the nose 2 (two) times daily as needed. 29 g 5   pantoprazole (PROTONIX) 40 MG tablet Take 1 tablet (40 mg total) by mouth daily. 90 tablet 2   SODIUM FLUORIDE, DENTAL RINSE, (PREVIDENT) 0.2 % SOLN USE AS AN ORAL RINSE, AS DIRECTED ON PACKAGE 473 mL 5   SODIUM FLUORIDE, DENTAL RINSE, (PREVIDENT) 0.2 % SOLN USE AS AN ORAL RINSE, AS DIRECTED ON PACKAGE 473 mL 5   valACYclovir (VALTREX) 1000 MG tablet Take 1 tablet (1,000 mg total) by mouth every 12 (twelve) hours. 14 tablet 0   albuterol (VENTOLIN HFA) 108 (90 Base) MCG/ACT inhaler Inhale 2 puffs into the lungs every 4 (four) hours as needed for wheezing. 8.5 g 1   montelukast (SINGULAIR) 10 MG tablet Take 1 tablet (10 mg total) by mouth at bedtime. 90 tablet 1   scopolamine (TRANSDERM-SCOP) 1 MG/3DAYS Place 1 patch (1.5 mg total) onto the skin every 3 (three) days. (Patient not taking: Reported on 12/28/2022) 4 patch 0   No current facility-administered medications for this visit.    Known medication allergies: Allergies  Allergen Reactions   Sulfa Antibiotics Swelling and Other (See Comments)    Swelling from sulfa eye drops as a child     Physical examination: Blood pressure (!) 120/90, pulse (!) 131, temperature 98.4 F (36.9 C), resp. rate (!) 161, height 5' 4.39" (1.636 m), weight 185 lb 9.6 oz (84.2 kg), SpO2 97 %.  General: Alert, interactive, in no acute distress. HEENT: PERRLA, TMs pearly gray, turbinates moderately edematous with clear discharge, post-pharynx non erythematous. Neck: Supple without lymphadenopathy. Lungs: Clear to auscultation without wheezing, rhonchi or rales. {no increased work of breathing. CV: Normal S1, S2 without murmurs. Abdomen: Nondistended, nontender. Skin: Warm and dry, without lesions or rashes. Extremities:  No clubbing, cyanosis or edema. Neuro:   Grossly  intact.  Diagnositics/Labs:  Spirometry: FEV1: 3.35L 123%, FVC: 3.82L 118%, ratio consistent with nonobstructive pattern  Allergy testing:   Airborne Adult Perc - 12/28/22 1432     Time Antigen Placed 0225    Allergen Manufacturer Waynette Buttery    Location Back    Number of Test 59    Panel 1 Select    1. Control-Buffer 50% Glycerol Negative    2. Control-Histamine 1 mg/ml 2+    3. Albumin saline Negative    4. Bahia 2+    5. French Southern Territories Negative    6. Johnson Negative    7. Kentucky Blue Negative    8. Meadow Fescue 3+    9. Perennial Rye Negative    10. Sweet Vernal Negative    11. Timothy 3+    12. Cocklebur Negative    13. Burweed Marshelder Negative    14.  Ragweed, short Negative    15. Ragweed, Giant Negative    16. Plantain,  English 2+    17. Lamb's Quarters 2+    18. Sheep Sorrell Negative    19. Rough Pigweed Negative    20. Marsh Elder, Rough Negative    21. Mugwort, Common Negative    22. Ash mix 2+    23. Birch mix 3+    24. Beech American 3+    25. Box, Elder 2+    26. Cedar, red 2+    27. Cottonwood, Eastern 3+    28. Elm mix 2+    29. Hickory Negative    30. Maple mix Negative    31. Oak, Guinea-Bissau mix 3+    32. Pecan Pollen 2+    33. Pine mix Negative    34. Sycamore Eastern Negative    35. Walnut, Black Pollen 2+    36. Alternaria alternata 2+    37. Cladosporium Herbarum Negative    38. Aspergillus mix Negative    39. Penicillium mix Negative    40. Bipolaris sorokiniana (Helminthosporium) Negative    41. Drechslera spicifera (Curvularia) 2+    42. Mucor plumbeus Negative    43. Fusarium moniliforme Negative    44. Aureobasidium pullulans (pullulara) Negative    45. Rhizopus oryzae Negative    46. Botrytis cinera Negative    47. Epicoccum nigrum Negative    48. Phoma betae Negative    49. Candida Albicans Negative    50. Trichophyton mentagrophytes Negative    51. Mite, D Farinae  5,000 AU/ml Negative    52. Mite, D Pteronyssinus  5,000 AU/ml 3+     53. Cat Hair 10,000 BAU/ml Negative    54.  Dog Epithelia Negative    55. Mixed Feathers Negative    56. Horse Epithelia Negative    57. Cockroach, German Negative    58. Mouse Negative    59. Tobacco Leaf Negative             Allergy testing results were read and interpreted by provider, documented by clinical staff.   Assessment and plan: Allergic rhinitis with conjunctivitis - Testing today showed: grasses, weeds, trees, outdoor molds, and dust mites. - Copy of test results provided.  - Avoidance measures provided. - Start taking: Xyzal (levocetirizine) 5mg  tablet once daily.  May take additional dose if needed. Singulair (montelukast) mg daily. Ryaltris (olopatadine/mometasone) two sprays per nostril 1-2 times daily as needed for runny or stuffy nose.   - You can use an extra dose of the antihistamine, if needed, for breakthrough symptoms.  - Consider nasal saline rinses 1-2 times daily to remove allergens from the nasal cavities as well as help with mucous clearance (this is especially helpful to do before the nasal sprays are given) - Reconsider allergy shots as a means of long-term control. - Allergy shots "re-train" and "reset" the immune system to ignore environmental allergens and decrease the resulting immune response to those allergens (sneezing, itchy watery eyes, runny nose, nasal congestion, etc).    - Allergy shots improve symptoms in 80-85% of patients.  - We can discuss more at the next appointment if the medications are not working for you.  Asthma - Good control at this time with normal lung function - Have access to albuterol inhaler 2 puffs every 4-6 hours as needed for cough/wheeze/shortness of breath/chest tightness.  May use 15-20 minutes prior to activity.   Monitor frequency of use.    Follow-up in  4-6 months or sooner if needed  I appreciate the opportunity to take part in Sandar's care. Please do not hesitate to contact me with  questions.  Sincerely,   Margo Aye, MD Allergy/Immunology [facility]

## 2022-12-28 NOTE — Patient Instructions (Addendum)
-   Testing today showed: grasses, weeds, trees, outdoor molds, and dust mites. - Copy of test results provided.  - Avoidance measures provided. - Start taking: Xyzal (levocetirizine) 5mg  tablet once daily.  May take additional dose if needed. Singulair (montelukast) mg daily. Ryaltris (olopatadine/mometasone) two sprays per nostril 1-2 times daily as needed for runny or stuffy nose.   - You can use an extra dose of the antihistamine, if needed, for breakthrough symptoms.  - Consider nasal saline rinses 1-2 times daily to remove allergens from the nasal cavities as well as help with mucous clearance (this is especially helpful to do before the nasal sprays are given) - Consider allergy shots as a means of long-term control. - Allergy shots "re-train" and "reset" the immune system to ignore environmental allergens and decrease the resulting immune response to those allergens (sneezing, itchy watery eyes, runny nose, nasal congestion, etc).    - Allergy shots improve symptoms in 80-85% of patients.  - We can discuss more at the next appointment if the medications are not working for you.  - Have access to albuterol inhaler 2 puffs every 4-6 hours as needed for cough/wheeze/shortness of breath/chest tightness.  May use 15-20 minutes prior to activity.   Monitor frequency of use.    Follow-up in 4-6 months or sooner if needed

## 2022-12-29 ENCOUNTER — Other Ambulatory Visit (HOSPITAL_BASED_OUTPATIENT_CLINIC_OR_DEPARTMENT_OTHER): Payer: Self-pay

## 2022-12-29 ENCOUNTER — Other Ambulatory Visit: Payer: Self-pay | Admitting: *Deleted

## 2022-12-29 ENCOUNTER — Telehealth: Payer: Self-pay | Admitting: Allergy

## 2022-12-29 MED ORDER — ALBUTEROL SULFATE HFA 108 (90 BASE) MCG/ACT IN AERS
2.0000 | INHALATION_SPRAY | RESPIRATORY_TRACT | 1 refills | Status: AC | PRN
  Filled 2022-12-29: qty 8.5, 17d supply, fill #0

## 2022-12-29 MED ORDER — VALACYCLOVIR HCL 1 G PO TABS
1.0000 g | ORAL_TABLET | Freq: Every day | ORAL | 3 refills | Status: AC
  Filled 2022-12-29: qty 14, 14d supply, fill #0
  Filled 2023-01-19: qty 14, 14d supply, fill #1
  Filled 2023-02-10: qty 14, 14d supply, fill #2
  Filled 2023-11-08: qty 14, 14d supply, fill #3

## 2022-12-29 MED ORDER — RYALTRIS 665-25 MCG/ACT NA SUSP
2.0000 | Freq: Two times a day (BID) | NASAL | 5 refills | Status: DC | PRN
  Filled 2022-12-29: qty 29, 30d supply, fill #0

## 2022-12-29 NOTE — Telephone Encounter (Signed)
Ryaltris prescription has been sent to [facility]  - [facility] per patient request.

## 2022-12-29 NOTE — Telephone Encounter (Signed)
Patient called and said that she needed the Ryaltris called into [facility] . [phone]

## 2022-12-30 ENCOUNTER — Other Ambulatory Visit (HOSPITAL_BASED_OUTPATIENT_CLINIC_OR_DEPARTMENT_OTHER): Payer: Self-pay

## 2022-12-30 ENCOUNTER — Other Ambulatory Visit: Payer: Self-pay | Admitting: Allergy

## 2022-12-30 MED ORDER — RYALTRIS 665-25 MCG/ACT NA SUSP: 2.0000 | Freq: Two times a day (BID) | NASAL | 5 refills | Status: DC | PRN

## 2023-01-05 ENCOUNTER — Other Ambulatory Visit (HOSPITAL_BASED_OUTPATIENT_CLINIC_OR_DEPARTMENT_OTHER): Payer: Self-pay

## 2023-01-05 MED ORDER — VALACYCLOVIR HCL 500 MG PO TABS
500.0000 mg | ORAL_TABLET | Freq: Two times a day (BID) | ORAL | 11 refills | Status: DC
  Filled 2023-01-05: qty 6, 3d supply, fill #0
  Filled 2023-02-10 – 2023-03-14 (×3): qty 6, 3d supply, fill #1

## 2023-01-05 MED ORDER — VALACYCLOVIR HCL 500 MG PO TABS
500.0000 mg | ORAL_TABLET | Freq: Every day | ORAL | 30 refills | Status: DC
  Filled 2023-02-10: qty 1, 1d supply, fill #0

## 2023-01-09 ENCOUNTER — Other Ambulatory Visit (HOSPITAL_BASED_OUTPATIENT_CLINIC_OR_DEPARTMENT_OTHER): Payer: Self-pay

## 2023-01-09 MED ORDER — VALACYCLOVIR HCL 500 MG PO TABS
500.0000 mg | ORAL_TABLET | Freq: Every day | ORAL | 1 refills | Status: DC
  Filled 2023-01-09: qty 14, 14d supply, fill #0
  Filled 2023-01-19 – 2023-01-31 (×2): qty 14, 14d supply, fill #1
  Filled 2023-02-09 – 2023-02-20 (×2): qty 14, 14d supply, fill #2
  Filled 2023-03-13 – 2023-03-14 (×3): qty 14, 14d supply, fill #3
  Filled 2023-03-29: qty 14, 14d supply, fill #4

## 2023-01-19 ENCOUNTER — Other Ambulatory Visit (HOSPITAL_BASED_OUTPATIENT_CLINIC_OR_DEPARTMENT_OTHER): Payer: Self-pay

## 2023-01-19 ENCOUNTER — Other Ambulatory Visit: Payer: Self-pay

## 2023-01-20 ENCOUNTER — Other Ambulatory Visit (HOSPITAL_BASED_OUTPATIENT_CLINIC_OR_DEPARTMENT_OTHER): Payer: Self-pay

## 2023-02-09 ENCOUNTER — Other Ambulatory Visit: Payer: Self-pay | Admitting: Nurse Practitioner

## 2023-02-09 ENCOUNTER — Ambulatory Visit: Payer: BC Managed Care – PPO | Admitting: Allergy

## 2023-02-09 ENCOUNTER — Encounter (HOSPITAL_BASED_OUTPATIENT_CLINIC_OR_DEPARTMENT_OTHER): Payer: Self-pay

## 2023-02-09 ENCOUNTER — Other Ambulatory Visit (HOSPITAL_BASED_OUTPATIENT_CLINIC_OR_DEPARTMENT_OTHER): Payer: Self-pay

## 2023-02-09 MED ORDER — PANTOPRAZOLE SODIUM 40 MG PO TBEC
40.0000 mg | DELAYED_RELEASE_TABLET | Freq: Every day | ORAL | 2 refills | Status: DC
  Filled 2023-02-09: qty 90, 90d supply, fill #0
  Filled 2023-02-26 – 2023-05-25 (×2): qty 90, 90d supply, fill #1
  Filled 2023-08-27: qty 90, 90d supply, fill #2

## 2023-02-10 ENCOUNTER — Other Ambulatory Visit: Payer: Self-pay

## 2023-02-10 ENCOUNTER — Other Ambulatory Visit (HOSPITAL_BASED_OUTPATIENT_CLINIC_OR_DEPARTMENT_OTHER): Payer: Self-pay

## 2023-02-20 ENCOUNTER — Other Ambulatory Visit (HOSPITAL_BASED_OUTPATIENT_CLINIC_OR_DEPARTMENT_OTHER): Payer: Self-pay

## 2023-02-26 ENCOUNTER — Other Ambulatory Visit (HOSPITAL_BASED_OUTPATIENT_CLINIC_OR_DEPARTMENT_OTHER): Payer: Self-pay

## 2023-02-27 ENCOUNTER — Other Ambulatory Visit: Payer: Self-pay

## 2023-03-14 ENCOUNTER — Other Ambulatory Visit (HOSPITAL_BASED_OUTPATIENT_CLINIC_OR_DEPARTMENT_OTHER): Payer: Self-pay

## 2023-03-29 ENCOUNTER — Other Ambulatory Visit (HOSPITAL_BASED_OUTPATIENT_CLINIC_OR_DEPARTMENT_OTHER): Payer: Self-pay

## 2023-03-29 ENCOUNTER — Other Ambulatory Visit: Payer: Self-pay

## 2023-03-29 MED ORDER — VALACYCLOVIR HCL 500 MG PO TABS
500.0000 mg | ORAL_TABLET | Freq: Every day | ORAL | 1 refills | Status: DC
  Filled 2023-03-29: qty 30, 30d supply, fill #0
  Filled 2023-04-30: qty 30, 30d supply, fill #1

## 2023-04-04 ENCOUNTER — Other Ambulatory Visit (HOSPITAL_BASED_OUTPATIENT_CLINIC_OR_DEPARTMENT_OTHER): Payer: Self-pay

## 2023-04-04 MED ORDER — AMPHETAMINE-DEXTROAMPHET ER 30 MG PO CP24
30.0000 mg | ORAL_CAPSULE | Freq: Every day | ORAL | 0 refills | Status: DC
  Filled 2023-04-04: qty 30, 30d supply, fill #0

## 2023-04-27 ENCOUNTER — Other Ambulatory Visit: Payer: Self-pay | Admitting: Oncology

## 2023-04-27 DIAGNOSIS — Z006 Encounter for examination for normal comparison and control in clinical research program: Secondary | ICD-10-CM

## 2023-04-28 DIAGNOSIS — Z30431 Encounter for routine checking of intrauterine contraceptive device: Secondary | ICD-10-CM | POA: Insufficient documentation

## 2023-04-30 ENCOUNTER — Other Ambulatory Visit (HOSPITAL_BASED_OUTPATIENT_CLINIC_OR_DEPARTMENT_OTHER): Payer: Self-pay

## 2023-05-01 ENCOUNTER — Other Ambulatory Visit (HOSPITAL_BASED_OUTPATIENT_CLINIC_OR_DEPARTMENT_OTHER): Payer: Self-pay

## 2023-05-02 ENCOUNTER — Other Ambulatory Visit: Payer: Self-pay

## 2023-05-02 ENCOUNTER — Other Ambulatory Visit (HOSPITAL_BASED_OUTPATIENT_CLINIC_OR_DEPARTMENT_OTHER): Payer: Self-pay

## 2023-05-02 MED ORDER — LURASIDONE HCL 40 MG PO TABS
ORAL_TABLET | ORAL | 0 refills | Status: DC
Start: 2023-05-02 — End: 2023-12-26
  Filled 2023-05-02: qty 30, 33d supply, fill #0

## 2023-05-02 MED ORDER — AMPHETAMINE-DEXTROAMPHET ER 30 MG PO CP24
30.0000 mg | ORAL_CAPSULE | Freq: Every day | ORAL | 0 refills | Status: AC
  Filled 2023-05-02: qty 30, 30d supply, fill #0

## 2023-05-03 ENCOUNTER — Other Ambulatory Visit (HOSPITAL_BASED_OUTPATIENT_CLINIC_OR_DEPARTMENT_OTHER): Payer: Self-pay

## 2023-05-03 ENCOUNTER — Ambulatory Visit: Payer: BC Managed Care – PPO | Admitting: Allergy

## 2023-05-03 DIAGNOSIS — J309 Allergic rhinitis, unspecified: Secondary | ICD-10-CM

## 2023-05-18 ENCOUNTER — Other Ambulatory Visit (HOSPITAL_BASED_OUTPATIENT_CLINIC_OR_DEPARTMENT_OTHER): Payer: Self-pay

## 2023-05-19 ENCOUNTER — Other Ambulatory Visit (HOSPITAL_BASED_OUTPATIENT_CLINIC_OR_DEPARTMENT_OTHER): Payer: Self-pay

## 2023-05-19 MED ORDER — VALACYCLOVIR HCL 500 MG PO TABS
500.0000 mg | ORAL_TABLET | Freq: Every day | ORAL | 5 refills | Status: DC
  Filled 2023-05-25: qty 90, 90d supply, fill #0
  Filled 2023-05-25: qty 30, 30d supply, fill #0

## 2023-05-25 ENCOUNTER — Encounter (HOSPITAL_BASED_OUTPATIENT_CLINIC_OR_DEPARTMENT_OTHER): Payer: Self-pay

## 2023-05-25 ENCOUNTER — Other Ambulatory Visit: Payer: Self-pay

## 2023-05-25 ENCOUNTER — Other Ambulatory Visit (HOSPITAL_BASED_OUTPATIENT_CLINIC_OR_DEPARTMENT_OTHER): Payer: Self-pay

## 2023-05-25 MED ORDER — AMPHETAMINE-DEXTROAMPHET ER 30 MG PO CP24
30.0000 mg | ORAL_CAPSULE | Freq: Every day | ORAL | 0 refills | Status: DC
  Filled 2023-06-02: qty 30, 30d supply, fill #0

## 2023-05-25 MED ORDER — VALACYCLOVIR HCL 500 MG PO TABS
500.0000 mg | ORAL_TABLET | Freq: Every day | ORAL | 4 refills | Status: DC
  Filled 2023-05-25 – 2023-06-03 (×2): qty 90, 90d supply, fill #0

## 2023-05-25 MED ORDER — LURASIDONE HCL 40 MG PO TABS
40.0000 mg | ORAL_TABLET | Freq: Every evening | ORAL | 1 refills | Status: DC
Start: 2023-05-25 — End: 2023-07-13
  Filled 2023-05-25: qty 30, 30d supply, fill #0
  Filled 2023-06-28: qty 30, 30d supply, fill #1

## 2023-05-25 MED ORDER — AMPHETAMINE-DEXTROAMPHET ER 30 MG PO CP24
30.0000 mg | ORAL_CAPSULE | Freq: Every day | ORAL | 0 refills | Status: DC
  Filled 2023-07-06: qty 30, 30d supply, fill #0

## 2023-05-28 ENCOUNTER — Other Ambulatory Visit (HOSPITAL_BASED_OUTPATIENT_CLINIC_OR_DEPARTMENT_OTHER): Payer: Self-pay

## 2023-05-29 ENCOUNTER — Other Ambulatory Visit (HOSPITAL_BASED_OUTPATIENT_CLINIC_OR_DEPARTMENT_OTHER): Payer: Self-pay

## 2023-06-01 ENCOUNTER — Other Ambulatory Visit (HOSPITAL_BASED_OUTPATIENT_CLINIC_OR_DEPARTMENT_OTHER): Payer: Self-pay

## 2023-06-02 ENCOUNTER — Other Ambulatory Visit (HOSPITAL_BASED_OUTPATIENT_CLINIC_OR_DEPARTMENT_OTHER): Payer: Self-pay

## 2023-06-03 ENCOUNTER — Other Ambulatory Visit (HOSPITAL_BASED_OUTPATIENT_CLINIC_OR_DEPARTMENT_OTHER): Payer: Self-pay

## 2023-06-07 NOTE — Progress Notes (Deleted)
Madelaine Bhat, CMA,acting as a Neurosurgeon for Arnette Felts, FNP.,have documented all relevant documentation on the behalf of Arnette Felts, FNP,as directed by  Arnette Felts, FNP while in the presence of Arnette Felts, FNP.  Subjective:    Patient ID: Megan Norris , female    DOB: 07-14-1991 , 32 y.o.   MRN: [id number]  No chief complaint on file.   HPI  Patient presents today for HM, Patient reports compliance with medication. Patient denies any chest pain, SOB, or headaches. Patient has no concerns today.     Past Medical History:  Diagnosis Date  . ADHD   . Asthma   . Cervical dysplasia   . Eczema   . False positive HIV serology   . GERD (gastroesophageal reflux disease)   . Heart murmur   . HPV (human papilloma virus) infection 2018     Family History  Problem Relation Age of Onset  . Allergic rhinitis Mother   . Hyperlipidemia Mother   . Hypertension Mother   . Allergic rhinitis Father   . Hyperlipidemia Father   . Allergic rhinitis Sister   . Hypertension Sister   . Stroke Paternal Uncle   . Stroke Maternal Grandmother   . Breast cancer Maternal Grandmother   . Diabetes Maternal Grandmother   . Hypertension Maternal Grandmother   . Hyperlipidemia Maternal Grandmother   . Heart disease Maternal Grandmother   . Heart attack Maternal Grandmother   . Cancer Maternal Grandfather   . Stroke Paternal Grandmother   . Diabetes Paternal Grandmother      Current Outpatient Medications:  .  albuterol (VENTOLIN HFA) 108 (90 Base) MCG/ACT inhaler, Inhale 2 puffs into the lungs every 4 (four) hours as needed for wheezing., Disp: 8.5 g, Rfl: 1 .  amphetamine-dextroamphetamine (ADDERALL XR) 25 MG 24 hr capsule, Take 1 capsule by mouth daily., Disp: 30 capsule, Rfl: 0 .  amphetamine-dextroamphetamine (ADDERALL XR) 25 MG 24 hr capsule, Take 1 capsule by mouth daily., Disp: 30 capsule, Rfl: 0 .  amphetamine-dextroamphetamine (ADDERALL XR) 25 MG 24 hr capsule, Take 1 capsule by mouth  daily., Disp: 30 capsule, Rfl: 0 .  amphetamine-dextroamphetamine (ADDERALL XR) 30 MG 24 hr capsule, Take 1 capsule (30 mg total) by mouth daily., Disp: 30 capsule, Rfl: 0 .  [START ON 06/23/2023] amphetamine-dextroamphetamine (ADDERALL XR) 30 MG 24 hr capsule, Take 1 capsule (30 mg total) by mouth daily., Disp: 30 capsule, Rfl: 0 .  amphetamine-dextroamphetamine (ADDERALL XR) 30 MG 24 hr capsule, Take 1 capsule (30 mg total) by mouth daily., Disp: 30 capsule, Rfl: 0 .  Ergocalciferol (VITAMIN D2) 50 MCG (2000 UT) TABS, Take 1 tablet by mouth daily., Disp: 90 tablet, Rfl: 1 .  levocetirizine (XYZAL) 5 MG tablet, Take 1 tablet (5 mg total) by mouth every evening., Disp: 30 tablet, Rfl: 5 .  levonorgestrel (MIRENA) 20 MCG/DAY IUD, 1 each by Intrauterine route once., Disp: , Rfl:  .  lurasidone (LATUDA) 40 MG TABS tablet, Take one-half tablets (20 mg total) by mouth at bedtime for 6 days, THEN 1 tablet (40 mg total) at bedtime for 27 days. Take with food., Disp: 30 tablet, Rfl: 0 .  lurasidone (LATUDA) 40 MG TABS tablet, Take 1 tablet (40 mg total) by mouth with food at bedtime., Disp: 30 tablet, Rfl: 1 .  montelukast (SINGULAIR) 10 MG tablet, Take 1 tablet (10 mg total) by mouth at bedtime., Disp: 90 tablet, Rfl: 1 .  Olopatadine-Mometasone (RYALTRIS) 665-25 MCG/ACT SUSP, Place 2 sprays into  the nose 2 (two) times daily as needed., Disp: 29 g, Rfl: 5 .  pantoprazole (PROTONIX) 40 MG tablet, Take 1 tablet (40 mg total) by mouth daily., Disp: 90 tablet, Rfl: 2 .  scopolamine (TRANSDERM-SCOP) 1 MG/3DAYS, Place 1 patch (1.5 mg total) onto the skin every 3 (three) days. (Patient not taking: Reported on 12/28/2022), Disp: 4 patch, Rfl: 0 .  SODIUM FLUORIDE, DENTAL RINSE, (PREVIDENT) 0.2 % SOLN, USE AS AN ORAL RINSE, AS DIRECTED ON PACKAGE, Disp: 473 mL, Rfl: 5 .  SODIUM FLUORIDE, DENTAL RINSE, (PREVIDENT) 0.2 % SOLN, USE AS AN ORAL RINSE, AS DIRECTED ON PACKAGE, Disp: 473 mL, Rfl: 5 .  valACYclovir (VALTREX)  1000 MG tablet, Take 1 tablet (1,000 mg total) by mouth every 12 (twelve) hours., Disp: 14 tablet, Rfl: 0 .  valACYclovir (VALTREX) 1000 MG tablet, Take 1 tablet (1,000 mg total) by mouth daily., Disp: 90 tablet, Rfl: 3 .  valACYclovir (VALTREX) 500 MG tablet, Take 1 tablet (500 mg total) by mouth daily for suppression., Disp: 1 tablet, Rfl: 30 .  valACYclovir (VALTREX) 500 MG tablet, Take 1 tablet (500 mg total) by mouth 2 (two) times daily for 3 days for outbreaks., Disp: 6 tablet, Rfl: 11 .  valACYclovir (VALTREX) 500 MG tablet, Take 1 tablet (500 mg total) by mouth daily., Disp: 30 tablet, Rfl: 5 .  valACYclovir (VALTREX) 500 MG tablet, Take 1 tablet (500 mg total) by mouth daily., Disp: 90 tablet, Rfl: 4   Allergies  Allergen Reactions  . Sulfa Antibiotics Swelling and Other (See Comments)    Swelling from sulfa eye drops as a child      The patient states she uses {contraceptive methods:5051} for birth control. No LMP recorded. (Menstrual status: IUD).. {Dysmenorrhea-menorrhagia:21918}. Negative for: breast discharge, breast lump(s), breast pain and breast self exam. Associated symptoms include abnormal vaginal bleeding. Pertinent negatives include abnormal bleeding (hematology), anxiety, decreased libido, depression, difficulty falling sleep, dyspareunia, history of infertility, nocturia, sexual dysfunction, sleep disturbances, urinary incontinence, urinary urgency, vaginal discharge and vaginal itching. Diet regular.The patient states her exercise level is    . The patient's tobacco use is:  Social History   Tobacco Use  Smoking Status Never  . Passive exposure: Never  Smokeless Tobacco Never  . She has been exposed to passive smoke. The patient's alcohol use is:  Social History   Substance and Sexual Activity  Alcohol Use Yes   Comment: occ  . Additional information: Last pap ***, next one scheduled for ***.    Review of Systems  Constitutional: Negative.   HENT: Negative.     Eyes: Negative.   Respiratory: Negative.    Cardiovascular: Negative.   Gastrointestinal: Negative.   Endocrine: Negative.   Genitourinary: Negative.   Musculoskeletal: Negative.   Skin: Negative.   Allergic/Immunologic: Negative.   Neurological: Negative.   Hematological: Negative.   Psychiatric/Behavioral: Negative.      There were no vitals filed for this visit. There is no height or weight on file to calculate BMI.  Wt Readings from Last 3 Encounters:  12/28/22 185 lb 9.6 oz (84.2 kg)  08/19/22 182 lb (82.6 kg)  06/30/22 186 lb 3.2 oz (84.5 kg)     Objective:  Physical Exam      Assessment And Plan:     Encounter for annual health examination  Positive ANA (antinuclear antibody)  Vitamin D deficiency  Elevated LDL cholesterol level     No follow-ups on file. Patient was given opportunity to ask questions.  Patient verbalized understanding of the plan and was able to repeat key elements of the plan. All questions were answered to their satisfaction.   Arnette Felts, FNP  I, Arnette Felts, FNP, have reviewed all documentation for this visit. The documentation on 06/07/23 for the exam, diagnosis, procedures, and orders are all accurate and complete.

## 2023-06-08 ENCOUNTER — Encounter: Payer: Self-pay | Admitting: Nurse Practitioner

## 2023-06-08 DIAGNOSIS — E7800 Pure hypercholesterolemia, unspecified: Secondary | ICD-10-CM

## 2023-06-08 DIAGNOSIS — R768 Other specified abnormal immunological findings in serum: Secondary | ICD-10-CM

## 2023-06-08 DIAGNOSIS — Z0000 Encounter for general adult medical examination without abnormal findings: Secondary | ICD-10-CM

## 2023-06-08 DIAGNOSIS — E559 Vitamin D deficiency, unspecified: Secondary | ICD-10-CM

## 2023-06-12 ENCOUNTER — Other Ambulatory Visit (HOSPITAL_COMMUNITY)
Admission: RE | Admit: 2023-06-12 | Discharge: 2023-06-12 | Disposition: A | Discharge status: Home / Self Care | Payer: Self-pay | Source: Ambulatory Visit | Attending: Oncology | Admitting: Oncology

## 2023-06-12 DIAGNOSIS — Z006 Encounter for examination for normal comparison and control in clinical research program: Secondary | ICD-10-CM | POA: Insufficient documentation

## 2023-06-14 ENCOUNTER — Other Ambulatory Visit (HOSPITAL_BASED_OUTPATIENT_CLINIC_OR_DEPARTMENT_OTHER): Payer: Self-pay

## 2023-06-20 ENCOUNTER — Other Ambulatory Visit (HOSPITAL_BASED_OUTPATIENT_CLINIC_OR_DEPARTMENT_OTHER): Payer: Self-pay

## 2023-06-20 LAB — HELIX MOLECULAR SCREEN: Genetic Analysis Overall Interpretation: NEGATIVE

## 2023-06-20 LAB — GENECONNECT MOLECULAR SCREEN

## 2023-06-26 ENCOUNTER — Other Ambulatory Visit (HOSPITAL_BASED_OUTPATIENT_CLINIC_OR_DEPARTMENT_OTHER): Payer: Self-pay

## 2023-06-26 ENCOUNTER — Other Ambulatory Visit: Payer: Self-pay | Admitting: Allergy

## 2023-06-26 MED ORDER — LEVOCETIRIZINE DIHYDROCHLORIDE 5 MG PO TABS
5.0000 mg | ORAL_TABLET | Freq: Every evening | ORAL | 0 refills | Status: DC
  Filled 2023-06-26: qty 30, 30d supply, fill #0

## 2023-06-28 ENCOUNTER — Other Ambulatory Visit (HOSPITAL_BASED_OUTPATIENT_CLINIC_OR_DEPARTMENT_OTHER): Payer: Self-pay

## 2023-07-05 ENCOUNTER — Other Ambulatory Visit (HOSPITAL_BASED_OUTPATIENT_CLINIC_OR_DEPARTMENT_OTHER): Payer: Self-pay

## 2023-07-06 ENCOUNTER — Other Ambulatory Visit (HOSPITAL_BASED_OUTPATIENT_CLINIC_OR_DEPARTMENT_OTHER): Payer: Self-pay

## 2023-07-06 ENCOUNTER — Ambulatory Visit: Payer: Medicaid Other | Admitting: Nurse Practitioner

## 2023-07-06 ENCOUNTER — Encounter (HOSPITAL_BASED_OUTPATIENT_CLINIC_OR_DEPARTMENT_OTHER): Payer: Self-pay

## 2023-07-06 ENCOUNTER — Encounter: Payer: Self-pay | Admitting: Nurse Practitioner

## 2023-07-06 VITALS — BP 120/70 | HR 85 | Temp 97.6°F | Ht 64.0 in | Wt 208.2 lb

## 2023-07-06 DIAGNOSIS — E559 Vitamin D deficiency, unspecified: Secondary | ICD-10-CM | POA: Diagnosis not present

## 2023-07-06 DIAGNOSIS — Z79899 Other long term (current) drug therapy: Secondary | ICD-10-CM

## 2023-07-06 DIAGNOSIS — E66812 Obesity, class 2: Secondary | ICD-10-CM

## 2023-07-06 DIAGNOSIS — Z0000 Encounter for general adult medical examination without abnormal findings: Secondary | ICD-10-CM | POA: Diagnosis not present

## 2023-07-06 DIAGNOSIS — E7800 Pure hypercholesterolemia, unspecified: Secondary | ICD-10-CM | POA: Diagnosis not present

## 2023-07-06 DIAGNOSIS — Z6835 Body mass index (BMI) 35.0-35.9, adult: Secondary | ICD-10-CM

## 2023-07-06 DIAGNOSIS — Z8709 Personal history of other diseases of the respiratory system: Secondary | ICD-10-CM | POA: Diagnosis not present

## 2023-07-06 DIAGNOSIS — E6609 Other obesity due to excess calories: Secondary | ICD-10-CM

## 2023-07-06 DIAGNOSIS — Z2821 Immunization not carried out because of patient refusal: Secondary | ICD-10-CM

## 2023-07-06 DIAGNOSIS — Z8659 Personal history of other mental and behavioral disorders: Secondary | ICD-10-CM

## 2023-07-06 MED ORDER — WEGOVY 0.5 MG/0.5ML ~~LOC~~ SOAJ
0.5000 mg | SUBCUTANEOUS | 0 refills | Status: DC
  Filled 2023-07-06 – 2023-07-24 (×2): qty 2, 28d supply, fill #0

## 2023-07-06 NOTE — Progress Notes (Addendum)
Madelaine Bhat, CMA,acting as a Neurosurgeon for Arnette Felts, FNP.,have documented all relevant documentation on the behalf of Arnette Felts, FNP,as directed by  Arnette Felts, FNP while in the presence of Arnette Felts, FNP.  Subjective:    Patient ID: Megan Norris , female    DOB: [birth year] , 32 y.o.   MRN: [id number]  Chief Complaint  Patient presents with   Annual Exam    HPI  Patient presents today for HM, Patient reports compliance with medication. Patient denies any chest pain, SOB, or headaches. Patient has no concerns today. She has a GYN at [facility]. She is now on 1000 mg valtrex once a day due to having multiple outbreaks.   Wt Readings from Last 3 Encounters: 07/06/23 : 208 lb 3.2 oz (94.4 kg) 12/28/22 : 185 lb 9.6 oz (84.2 kg) 08/19/22 : 182 lb (82.6 kg)  She has started cooking and eating a healthy diet for the last 3 months. She was doing more HIIT vs weight for the last 3 months. She continues to have problems with losing weight      Past Medical History:  Diagnosis Date   ADHD    Allergy    Asthma    Cervical dysplasia    Eczema    False positive HIV serology    GERD (gastroesophageal reflux disease)    Heart murmur    HPV (human papilloma virus) infection 2018     Family History  Problem Relation Age of Onset   Allergic rhinitis Mother    Hyperlipidemia Mother    Hypertension Mother    Allergic rhinitis Father    Hyperlipidemia Father    Allergic rhinitis Sister    Hypertension Sister    Stroke Paternal Uncle    Stroke Maternal Grandmother    Breast cancer Maternal Grandmother    Diabetes Maternal Grandmother    Hypertension Maternal Grandmother    Hyperlipidemia Maternal Grandmother    Heart disease Maternal Grandmother    Heart attack Maternal Grandmother    Cancer Maternal Grandmother    Kidney disease Maternal Grandmother    Cancer Maternal Grandfather    Stroke Paternal Grandmother    Diabetes Paternal Grandmother    Cancer  Maternal Uncle    Cancer Maternal Uncle      Current Outpatient Medications:    albuterol (VENTOLIN HFA) 108 (90 Base) MCG/ACT inhaler, Inhale 2 puffs into the lungs every 4 (four) hours as needed for wheezing., Disp: 8.5 g, Rfl: 1   amphetamine-dextroamphetamine (ADDERALL XR) 30 MG 24 hr capsule, Take 1 capsule (30 mg total) by mouth daily., Disp: 30 capsule, Rfl: 0   amphetamine-dextroamphetamine (ADDERALL XR) 30 MG 24 hr capsule, Take 1 capsule (30 mg total) by mouth daily., Disp: 30 capsule, Rfl: 0   Ergocalciferol (VITAMIN D2) 50 MCG (2000 UT) TABS, Take 1 tablet by mouth daily., Disp: 90 tablet, Rfl: 1   levocetirizine (XYZAL) 5 MG tablet, Take 1 tablet (5 mg total) by mouth every evening., Disp: 30 tablet, Rfl: 0   levonorgestrel (MIRENA) 20 MCG/DAY IUD, 1 each by Intrauterine route once., Disp: , Rfl:    lurasidone (LATUDA) 40 MG TABS tablet, Take one-half tablets (20 mg total) by mouth at bedtime for 6 days, THEN 1 tablet (40 mg total) at bedtime for 27 days. Take with food., Disp: 30 tablet, Rfl: 0   montelukast (SINGULAIR) 10 MG tablet, Take 1 tablet (10 mg total) by mouth at bedtime., Disp: 90 tablet, Rfl: 1  Olopatadine-Mometasone (RYALTRIS) 665-25 MCG/ACT SUSP, Place 2 sprays into the nose 2 (two) times daily as needed., Disp: 29 g, Rfl: 5   pantoprazole (PROTONIX) 40 MG tablet, Take 1 tablet (40 mg total) by mouth daily., Disp: 90 tablet, Rfl: 2   Semaglutide-Weight Management (WEGOVY) 0.5 MG/0.5ML SOAJ, Inject 0.5 mg into the skin once a week., Disp: 2 mL, Rfl: 0   SODIUM FLUORIDE, DENTAL RINSE, (PREVIDENT) 0.2 % SOLN, USE AS AN ORAL RINSE, AS DIRECTED ON PACKAGE, Disp: 473 mL, Rfl: 5   SODIUM FLUORIDE, DENTAL RINSE, (PREVIDENT) 0.2 % SOLN, USE AS AN ORAL RINSE, AS DIRECTED ON PACKAGE, Disp: 473 mL, Rfl: 5   valACYclovir (VALTREX) 1000 MG tablet, Take 1 tablet (1,000 mg total) by mouth every 12 (twelve) hours., Disp: 14 tablet, Rfl: 0   valACYclovir (VALTREX) 1000 MG tablet, Take  1 tablet (1,000 mg total) by mouth daily., Disp: 90 tablet, Rfl: 3   amphetamine-dextroamphetamine (ADDERALL XR) 30 MG 24 hr capsule, Take 1 capsule (30 mg total) by mouth daily., Disp: 30 capsule, Rfl: 0   lurasidone (LATUDA) 40 MG TABS tablet, Take 1 tablet (40 mg total) by mouth at bedtime. Take with food., Disp: 30 tablet, Rfl: 0   scopolamine (TRANSDERM-SCOP) 1 MG/3DAYS, Place 1 patch (1.5 mg total) onto the skin every 3 (three) days. (Patient not taking: Reported on 12/28/2022), Disp: 4 patch, Rfl: 0   Allergies  Allergen Reactions   Sulfa Antibiotics Swelling and Other (See Comments)    Swelling from sulfa eye drops as a child      The patient states she uses IUD for birth control. No LMP recorded (lmp unknown). (Menstrual status: IUD).. Negative for Dysmenorrhea and Negative for Menorrhagia. Negative for: breast discharge, breast lump(s), breast pain and breast self exam. Associated symptoms include abnormal vaginal bleeding. Pertinent negatives include abnormal bleeding (hematology), anxiety, decreased libido, depression, difficulty falling sleep, dyspareunia, history of infertility, nocturia, sexual dysfunction, sleep disturbances, urinary incontinence, urinary urgency, vaginal discharge and vaginal itching. Diet regular; during her menstrual cycle she eats what she wants. She will try to make healthier choices. The patient states her exercise level is minimum of 3 days a week.   . The patient's tobacco use is:  Social History   Tobacco Use  Smoking Status Never   Passive exposure: Never  Smokeless Tobacco Never   She has been exposed to passive smoke. The patient's alcohol use is:  Social History   Substance and Sexual Activity  Alcohol Use Yes   Alcohol/week: 1.0 standard drink of alcohol   Types: 1 Shots of liquor per week   Comment: Occasion drinker   Additional information: Last pap 07/20/2021, next one scheduled for 07/20/2024.    Review of Systems  Constitutional:  Negative.   HENT: Negative.    Eyes: Negative.   Respiratory: Negative.    Cardiovascular: Negative.   Gastrointestinal: Negative.   Endocrine: Negative.   Genitourinary: Negative.   Musculoskeletal: Negative.   Skin: Negative.   Allergic/Immunologic: Negative.   Neurological: Negative.   Hematological: Negative.   Psychiatric/Behavioral: Negative.       Today's Vitals   07/06/23 1028  BP: 120/70  Pulse: 85  Temp: 97.6 F (36.4 C)  TempSrc: Oral  Weight: 208 lb 3.2 oz (94.4 kg)  Height: 5\' 4"  (1.626 m)  PainSc: 6   PainLoc: Knee   Body mass index is 35.74 kg/m.  Wt Readings from Last 3 Encounters:  07/06/23 208 lb 3.2 oz (94.4 kg)  12/28/22  185 lb 9.6 oz (84.2 kg)  08/19/22 182 lb (82.6 kg)     Objective:  Physical Exam Vitals reviewed.  Constitutional:      General: She is not in acute distress.    Appearance: Normal appearance. She is well-developed. She is obese.  HENT:     Head: Normocephalic and atraumatic.     Right Ear: Hearing, tympanic membrane, ear canal and external ear normal. There is no impacted cerumen.     Left Ear: Hearing, tympanic membrane, ear canal and external ear normal. There is no impacted cerumen.     Nose:     Comments: Deferred - masked    Mouth/Throat:     Comments: Deferred - masked Eyes:     General: Lids are normal.     Extraocular Movements: Extraocular movements intact.     Conjunctiva/sclera: Conjunctivae normal.     Pupils: Pupils are equal, round, and reactive to light.     Funduscopic exam:    Right eye: No papilledema.        Left eye: No papilledema.  Neck:     Thyroid: No thyroid mass.     Vascular: No carotid bruit.  Cardiovascular:     Rate and Rhythm: Normal rate and regular rhythm.     Pulses: Normal pulses.     Heart sounds: Normal heart sounds. No murmur heard. Pulmonary:     Effort: Pulmonary effort is normal. No respiratory distress.     Breath sounds: Normal breath sounds. No wheezing.  Chest:      Chest wall: No mass.  Breasts:    Tanner Score is 5.     Right: Normal. No mass or tenderness.     Left: Normal. No mass or tenderness.  Abdominal:     General: Abdomen is flat. Bowel sounds are normal. There is no distension.     Palpations: Abdomen is soft.     Tenderness: There is no abdominal tenderness.  Genitourinary:    Comments: Deferred - being followed by [facility] Musculoskeletal:        General: No swelling. Normal range of motion.     Cervical back: Full passive range of motion without pain, normal range of motion and neck supple.     Right lower leg: No edema.     Left lower leg: No edema.  Lymphadenopathy:     Upper Body:     Right upper body: No supraclavicular, axillary or pectoral adenopathy.     Left upper body: No supraclavicular, axillary or pectoral adenopathy.  Skin:    General: Skin is warm and dry.     Capillary Refill: Capillary refill takes less than 2 seconds.  Neurological:     General: No focal deficit present.     Mental Status: She is alert and oriented to person, place, and time.     Cranial Nerves: No cranial nerve deficit.     Sensory: No sensory deficit.  Psychiatric:        Mood and Affect: Mood normal.        Behavior: Behavior normal.        Thought Content: Thought content normal.        Judgment: Judgment normal.         Assessment And Plan:     Encounter for annual health examination Assessment & Plan: Behavior modifications discussed and diet history reviewed.   Pt will continue to exercise regularly and modify diet with low GI, plant based foods and  decrease intake of processed foods.  Recommend intake of daily multivitamin, Vitamin D, and calcium.  Recommend monthly self breast exams for preventive screenings, as well as recommend immunizations that include influenza, TDAP    Vitamin D deficiency Assessment & Plan: Will check vitamin D level and supplement as needed.    Also encouraged to spend 15 minutes in  the sun daily.    Orders: -     VITAMIN D 25 Hydroxy (Vit-D Deficiency, Fractures)  Elevated LDL cholesterol level Assessment & Plan: Cholesterol levels are stable  Orders: -     Lipid panel  History of ADHD -     CMP14+EGFR  COVID-19 vaccination declined Assessment & Plan: Declines covid 19 vaccine. Discussed risk of covid 57 and if she changes her mind about the vaccine to call the office. Education has been provided regarding the importance of this vaccine but patient still declined. Advised may receive this vaccine at local pharmacy or Health Dept.or vaccine clinic. Aware to provide a copy of the vaccination record if obtained from local pharmacy or Health Dept.  Encouraged to take multivitamin, vitamin d, vitamin c and zinc to increase immune system. Aware can call office if would like to have vaccine here at office. Verbalized acceptance and understanding.    Class 2 obesity due to excess calories with body mass index (BMI) of 35.0 to 35.9 in adult, unspecified whether serious comorbidity present Assessment & Plan: She is encouraged to continue to strive for BMI less than 30 to decrease cardiac risk. Advised to continue to  aim for at least 150 minutes of exercise per week. She has been eating a more healthy diet and regular exercise for the last 3 months without success. we have started [facility], discussed side effects to include nausea, difficulty swallowing and abdominal pain. She is to titrate weekly as tolerated. 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  Dispense: 2 mL; Refill: 0  History of asthma -     Pneumococcal conjugate vaccine 20-valent  Other long term (current) drug therapy -     CBC with Differential/Platelet    Return for 1 year physical; 8-10 week weight check. Patient was given opportunity to ask questions.  Patient verbalized understanding of the plan and was able to repeat key elements of the plan. All questions were answered to their satisfaction.   Arnette Felts, FNP  I, Arnette Felts, FNP, have reviewed all documentation for this visit. The documentation on 07/06/23 for the exam, diagnosis, procedures, and orders are all accurate and complete.

## 2023-07-06 NOTE — Patient Instructions (Addendum)
If you have any stomach pain or difficulty swallowing call to office [facility] may cause nausea allow time for this to improve, avoid fatty and sugary foods Goal to lose 1-2 lbs per week Increase your physical activity and incorporate 2 days of strength training.

## 2023-07-07 LAB — CBC WITH DIFFERENTIAL/PLATELET
Basophils Absolute: 0 10*3/uL (ref 0.0–0.2)
Basos: 1 %
EOS (ABSOLUTE): 0.1 10*3/uL (ref 0.0–0.4)
Eos: 2 %
Hematocrit: 40.4 % (ref 34.0–46.6)
Hemoglobin: 13.5 g/dL (ref 11.1–15.9)
Immature Grans (Abs): 0 10*3/uL (ref 0.0–0.1)
Immature Granulocytes: 0 %
Lymphocytes Absolute: 1.9 10*3/uL (ref 0.7–3.1)
Lymphs: 36 %
MCH: 31.2 pg (ref 26.6–33.0)
MCHC: 33.4 g/dL (ref 31.5–35.7)
MCV: 93 fL (ref 79–97)
Monocytes Absolute: 0.4 10*3/uL (ref 0.1–0.9)
Monocytes: 7 %
Neutrophils Absolute: 2.9 10*3/uL (ref 1.4–7.0)
Neutrophils: 54 %
Platelets: 241 10*3/uL (ref 150–450)
RBC: 4.33 x10E6/uL (ref 3.77–5.28)
RDW: 13.4 % (ref 11.7–15.4)
WBC: 5.4 10*3/uL (ref 3.4–10.8)

## 2023-07-07 LAB — LIPID PANEL
Chol/HDL Ratio: 2.8 ratio (ref 0.0–4.4)
Cholesterol, Total: 205 mg/dL — ABNORMAL HIGH (ref 100–199)
HDL: 72 mg/dL (ref >39)
LDL Chol Calc (NIH): 121 mg/dL — ABNORMAL HIGH (ref 0–99)
Triglycerides: 65 mg/dL (ref 0–149)
VLDL Cholesterol Cal: 12 mg/dL (ref 5–40)

## 2023-07-07 LAB — CMP14+EGFR
ALT: 12 [IU]/L (ref 0–32)
AST: 18 [IU]/L (ref 0–40)
Albumin: 4.4 g/dL (ref 3.9–4.9)
Alkaline Phosphatase: 43 [IU]/L — ABNORMAL LOW (ref 44–121)
BUN/Creatinine Ratio: 24 — ABNORMAL HIGH (ref 9–23)
BUN: 22 mg/dL — ABNORMAL HIGH (ref 6–20)
Bilirubin Total: 0.3 mg/dL (ref 0.0–1.2)
CO2: 25 mmol/L (ref 20–29)
Calcium: 9.6 mg/dL (ref 8.7–10.2)
Chloride: 98 mmol/L (ref 96–106)
Creatinine, Ser: 0.93 mg/dL (ref 0.57–1.00)
Globulin, Total: 2.9 g/dL (ref 1.5–4.5)
Glucose: 72 mg/dL (ref 70–99)
Potassium: 4.6 mmol/L (ref 3.5–5.2)
Sodium: 137 mmol/L (ref 134–144)
Total Protein: 7.3 g/dL (ref 6.0–8.5)
eGFR: 83 mL/min/{1.73_m2} (ref >59)

## 2023-07-07 LAB — VITAMIN D 25 HYDROXY (VIT D DEFICIENCY, FRACTURES): Vit D, 25-Hydroxy: 31.7 ng/mL (ref 30.0–100.0)

## 2023-07-13 ENCOUNTER — Other Ambulatory Visit (HOSPITAL_BASED_OUTPATIENT_CLINIC_OR_DEPARTMENT_OTHER): Payer: Self-pay

## 2023-07-13 MED ORDER — LURASIDONE HCL 40 MG PO TABS
40.0000 mg | ORAL_TABLET | Freq: Every day | ORAL | 0 refills | Status: DC
  Filled 2023-07-13: qty 30, 30d supply, fill #0

## 2023-07-13 MED ORDER — AMPHETAMINE-DEXTROAMPHET ER 30 MG PO CP24
30.0000 mg | ORAL_CAPSULE | Freq: Every day | ORAL | 0 refills | Status: DC
  Filled 2023-07-13: qty 30, 30d supply, fill #0

## 2023-07-14 ENCOUNTER — Other Ambulatory Visit: Payer: Self-pay

## 2023-07-15 ENCOUNTER — Other Ambulatory Visit (HOSPITAL_BASED_OUTPATIENT_CLINIC_OR_DEPARTMENT_OTHER): Payer: Self-pay

## 2023-07-15 DIAGNOSIS — E6609 Other obesity due to excess calories: Secondary | ICD-10-CM | POA: Insufficient documentation

## 2023-07-15 DIAGNOSIS — E7800 Pure hypercholesterolemia, unspecified: Secondary | ICD-10-CM | POA: Insufficient documentation

## 2023-07-15 DIAGNOSIS — Z0000 Encounter for general adult medical examination without abnormal findings: Secondary | ICD-10-CM | POA: Insufficient documentation

## 2023-07-15 DIAGNOSIS — Z2821 Immunization not carried out because of patient refusal: Secondary | ICD-10-CM | POA: Insufficient documentation

## 2023-07-15 DIAGNOSIS — Z8659 Personal history of other mental and behavioral disorders: Secondary | ICD-10-CM | POA: Insufficient documentation

## 2023-07-15 DIAGNOSIS — E66812 Obesity, class 2: Secondary | ICD-10-CM | POA: Insufficient documentation

## 2023-07-15 DIAGNOSIS — E559 Vitamin D deficiency, unspecified: Secondary | ICD-10-CM | POA: Insufficient documentation

## 2023-07-15 NOTE — Assessment & Plan Note (Addendum)
She is encouraged to continue to strive for BMI less than 30 to decrease cardiac risk. Advised to continue to  aim for at least 150 minutes of exercise per week. She has been eating a more healthy diet and regular exercise for the last 3 months without success. we have started [facility], discussed side effects to include nausea, difficulty swallowing and abdominal pain. She is to titrate weekly as tolerated. 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

## 2023-07-15 NOTE — Assessment & Plan Note (Signed)
Cholesterol levels are stable

## 2023-07-15 NOTE — Assessment & Plan Note (Signed)

## 2023-07-15 NOTE — Assessment & Plan Note (Signed)
Behavior modifications discussed and diet history reviewed.   Pt will continue to exercise regularly and modify diet with low GI, plant based foods and decrease intake of processed foods.  Recommend intake of daily multivitamin, Vitamin D, and calcium.  Recommend monthly self breast exams for preventive screenings, as well as recommend immunizations that include influenza, TDAP

## 2023-07-15 NOTE — Assessment & Plan Note (Signed)
Will check vitamin D level and supplement as needed.    Also encouraged to spend 15 minutes in the sun daily.   

## 2023-07-17 ENCOUNTER — Other Ambulatory Visit (HOSPITAL_BASED_OUTPATIENT_CLINIC_OR_DEPARTMENT_OTHER): Payer: Self-pay

## 2023-07-21 ENCOUNTER — Other Ambulatory Visit (HOSPITAL_BASED_OUTPATIENT_CLINIC_OR_DEPARTMENT_OTHER): Payer: Self-pay

## 2023-07-21 ENCOUNTER — Other Ambulatory Visit: Payer: Self-pay

## 2023-07-24 ENCOUNTER — Other Ambulatory Visit (HOSPITAL_BASED_OUTPATIENT_CLINIC_OR_DEPARTMENT_OTHER): Payer: Self-pay

## 2023-07-24 ENCOUNTER — Telehealth: Payer: Self-pay

## 2023-07-24 NOTE — Telephone Encounter (Signed)
Prior Auth for [employer] 0.5mg  has been submitted to medicaid. We are waiting on the determination. YL,RMA

## 2023-07-26 ENCOUNTER — Encounter: Payer: Self-pay | Admitting: Nurse Practitioner

## 2023-07-26 ENCOUNTER — Other Ambulatory Visit (HOSPITAL_BASED_OUTPATIENT_CLINIC_OR_DEPARTMENT_OTHER): Payer: Self-pay

## 2023-07-26 ENCOUNTER — Other Ambulatory Visit: Payer: Self-pay | Admitting: Allergy

## 2023-07-26 MED ORDER — LEVOCETIRIZINE DIHYDROCHLORIDE 5 MG PO TABS
5.0000 mg | ORAL_TABLET | Freq: Every evening | ORAL | 0 refills | Status: DC
  Filled 2023-07-26: qty 30, 30d supply, fill #0

## 2023-07-27 ENCOUNTER — Ambulatory Visit (INDEPENDENT_AMBULATORY_CARE_PROVIDER_SITE_OTHER): Payer: Medicaid Other | Admitting: Allergy

## 2023-07-27 ENCOUNTER — Other Ambulatory Visit: Payer: Self-pay

## 2023-07-27 ENCOUNTER — Encounter: Payer: Self-pay | Admitting: Allergy

## 2023-07-27 ENCOUNTER — Telehealth: Payer: Self-pay

## 2023-07-27 ENCOUNTER — Other Ambulatory Visit (HOSPITAL_BASED_OUTPATIENT_CLINIC_OR_DEPARTMENT_OTHER): Payer: Self-pay

## 2023-07-27 VITALS — BP 112/80 | HR 74 | Temp 97.6°F | Resp 20

## 2023-07-27 DIAGNOSIS — J3089 Other allergic rhinitis: Secondary | ICD-10-CM | POA: Diagnosis not present

## 2023-07-27 DIAGNOSIS — J4520 Mild intermittent asthma, uncomplicated: Secondary | ICD-10-CM

## 2023-07-27 DIAGNOSIS — H1013 Acute atopic conjunctivitis, bilateral: Secondary | ICD-10-CM | POA: Diagnosis not present

## 2023-07-27 MED ORDER — MONTELUKAST SODIUM 10 MG PO TABS
10.0000 mg | ORAL_TABLET | Freq: Every day | ORAL | 2 refills | Status: AC
  Filled 2023-07-27 – 2023-08-27 (×2): qty 90, 90d supply, fill #0
  Filled 2024-07-18: qty 90, 90d supply, fill #1

## 2023-07-27 MED ORDER — LEVOCETIRIZINE DIHYDROCHLORIDE 5 MG PO TABS
5.0000 mg | ORAL_TABLET | Freq: Every evening | ORAL | 5 refills | Status: DC
  Filled 2023-07-27 – 2023-08-27 (×3): qty 30, 30d supply, fill #0
  Filled 2023-10-12: qty 30, 30d supply, fill #1
  Filled 2023-11-08: qty 30, 30d supply, fill #2
  Filled 2023-12-13: qty 30, 30d supply, fill #3
  Filled 2024-01-08: qty 30, 30d supply, fill #4

## 2023-07-27 MED ORDER — AZELASTINE-FLUTICASONE 137-50 MCG/ACT NA SUSP
1.0000 | Freq: Two times a day (BID) | NASAL | 5 refills | Status: AC
  Filled 2023-07-27 – 2023-08-08 (×2): qty 23, 30d supply, fill #0
  Filled 2023-11-22: qty 23, 30d supply, fill #1

## 2023-07-27 NOTE — Progress Notes (Signed)
Follow-up Note  RE: Megan Norris MRN: [id number] DOB: 02/13/1991 Date of Office Visit: 07/27/2023   History of present illness: Megan Norris is a 32 y.o. female presenting today for follow-up of allergic rhinitis with conjunctivitis and asthma.  She was last seen in the office on 12/28/2022 by myself.  Discussed the use of AI scribe software for clinical note transcription with the patient, who gave verbal consent to proceed.  She presents with recent changes in voice quality, throat clearing, and nasal congestion. She is unsure if these symptoms are due to allergies or asthma. The patient reports that these symptoms started recently, and she has been considering restarting allergy injections. She has been taking Singulair and Xyzal, with the latter seeming to work better than previous antihistamines like Claritin and Zyrtec. The patient has not been using Ryaltris due to insurance coverage issues. She has not been performing saline rinses but has used them on her child. She is open to incorporating this into her regimen. She has not needed to use an inhaler and denies any shortness of breath.  The patient has previously undergone allergy testing, which revealed sensitivities to grasses, weeds, trees, molds, and dust mites. She has made some environmental changes at home, such as replacing old flooring with new luxury vinyl plank and removing carpets, in an attempt to reduce allergen exposure.   Review of systems: 10pt ROS negative unless noted above in HPI  All other systems negative unless noted above in HPI  Past medical/social/surgical/family history have been reviewed and are unchanged unless specifically indicated below.  No changes  Medication List: Current Outpatient Medications  Medication Sig Dispense Refill   albuterol (VENTOLIN HFA) 108 (90 Base) MCG/ACT inhaler Inhale 2 puffs into the lungs every 4 (four) hours as needed for wheezing. 8.5 g 1    amphetamine-dextroamphetamine (ADDERALL XR) 30 MG 24 hr capsule Take 1 capsule (30 mg total) by mouth daily. 30 capsule 0   amphetamine-dextroamphetamine (ADDERALL XR) 30 MG 24 hr capsule Take 1 capsule (30 mg total) by mouth daily. 30 capsule 0   amphetamine-dextroamphetamine (ADDERALL XR) 30 MG 24 hr capsule Take 1 capsule (30 mg total) by mouth daily. 30 capsule 0   Ergocalciferol (VITAMIN D2) 50 MCG (2000 UT) TABS Take 1 tablet by mouth daily. 90 tablet 1   Ferrous Sulfate (IRON PO) Take 1 tablet by mouth daily.     fluticasone (FLONASE ALLERGY RELIEF) 50 MCG/ACT nasal spray Place 2 sprays into both nostrils daily.     ibuprofen (ADVIL) 800 MG tablet Take 800 mg by mouth 3 (three) times daily.     levocetirizine (XYZAL) 5 MG tablet Take 1 tablet (5 mg total) by mouth every evening. 30 tablet 0   levonorgestrel (MIRENA) 20 MCG/DAY IUD 1 each by Intrauterine route once.     lurasidone (LATUDA) 40 MG TABS tablet Take one-half tablets (20 mg total) by mouth at bedtime for 6 days, THEN 1 tablet (40 mg total) at bedtime for 27 days. Take with food. 30 tablet 0   lurasidone (LATUDA) 40 MG TABS tablet Take 1 tablet (40 mg total) by mouth at bedtime. Take with food. 30 tablet 0   montelukast (SINGULAIR) 10 MG tablet Take 1 tablet (10 mg total) by mouth at bedtime. 90 tablet 1   pantoprazole (PROTONIX) 40 MG tablet Take 1 tablet (40 mg total) by mouth daily. 90 tablet 2   Semaglutide-Weight Management (WEGOVY) 0.5 MG/0.5ML SOAJ Inject 0.5 mg into the skin  once a week. 2 mL 0   SODIUM FLUORIDE, DENTAL RINSE, (PREVIDENT) 0.2 % SOLN USE AS AN ORAL RINSE, AS DIRECTED ON PACKAGE 473 mL 5   valACYclovir (VALTREX) 1000 MG tablet Take 1 tablet (1,000 mg total) by mouth every 12 (twelve) hours. 14 tablet 0   valACYclovir (VALTREX) 1000 MG tablet Take 1 tablet (1,000 mg total) by mouth daily. 90 tablet 3   Olopatadine-Mometasone (RYALTRIS) 665-25 MCG/ACT SUSP Place 2 sprays into the nose 2 (two) times daily as  needed. (Patient not taking: Reported on 07/27/2023) 29 g 5   scopolamine (TRANSDERM-SCOP) 1 MG/3DAYS Place 1 patch (1.5 mg total) onto the skin every 3 (three) days. (Patient not taking: Reported on 07/27/2023) 4 patch 0   No current facility-administered medications for this visit.     Known medication allergies: Allergies  Allergen Reactions   Sulfa Antibiotics Other (See Comments), Swelling and Nausea And Vomiting    Swelling from sulfa eye drops as a child     Physical examination: Blood pressure 112/80, pulse 74, temperature 97.6 F (36.4 C), temperature source Temporal, resp. rate 20, SpO2 97%.  General: Alert, interactive, in no acute distress. HEENT: PERRLA, TMs pearly gray, turbinates moderately edematous with clear discharge, post-pharynx non erythematous. Neck: Supple without lymphadenopathy. Lungs: Clear to auscultation without wheezing, rhonchi or rales. {no increased work of breathing. CV: Normal S1, S2 without murmurs. Abdomen: Nondistended, nontender. Skin: Warm and dry, without lesions or rashes. Extremities:  No clubbing, cyanosis or edema. Neuro:   Grossly intact.  Diagnositics/Labs: Spirometry: FEV1: 3.24 L or 120%, FVC: 3.86 L 120%, ratio consistent with nonobstructive pattern  Assessment and plan: Allergic rhinitis with conjunctivitis - Continue avoidance measures for grasses, weeds, trees, outdoor molds, and dust mites. - Continue Xyzal (levocetirizine) 5mg  tablet once daily.  May take additional dose if needed. Singulair (montelukast) mg daily. - Start Dymista 1 spray each nostril twice a day as needed for runny or stuffy nose.   With using nasal sprays point tip of bottle toward eye on same side nostril and lean head slightly forward for best technique.   - Recommend nasal saline rinses 3-7 times a week to remove allergens from the nasal cavities as well as help with mucous clearance (this is especially helpful to do before the nasal sprays are given) -  Consider allergy shots as a means of long-term control.  Discussed both ways to start today including traditional and [facility] protocol.  Information packet provided.  Will have our [facility] schedulers contact you to get scheduled.   - Allergy shots "re-train" and "reset" the immune system to ignore environmental allergens and decrease the resulting immune response to those allergens (sneezing, itchy watery eyes, runny nose, nasal congestion, etc).    - Allergy shots improve symptoms in 80-85% of patients.   Asthma - Have access to albuterol inhaler 2 puffs every 4-6 hours as needed for cough/wheeze/shortness of breath/chest tightness.  May use 15-20 minutes prior to activity.   Monitor frequency of use.    Follow-up in 6 months or sooner if needed  I appreciate the opportunity to take part in Wyatt's care. Please do not hesitate to contact me with questions.  Sincerely,   Margo Aye, MD Allergy/Immunology [facility]

## 2023-07-27 NOTE — Telephone Encounter (Signed)
-----   Message from [facility] sent at 07/27/2023  5:11 PM EST ----- Patient interested in getting back on her allergy shots via [facility] protocol.  Can you call and get her scheduled and send in the premedication regimen.  Her vials will be different from her previous prescription as she has had updated testing.

## 2023-07-27 NOTE — Telephone Encounter (Signed)
Patient wegovy was denied due to not having accurate insurance updated.

## 2023-07-27 NOTE — Telephone Encounter (Signed)
Spoke with patient, informed her that we are looking at available dates and will call her once we have some. Patient is going to call her insurance to check on coverage.

## 2023-07-27 NOTE — Patient Instructions (Addendum)
-   Continue avoidance measures for grasses, weeds, trees, outdoor molds, and dust mites. - Continue Xyzal (levocetirizine) 5mg  tablet once daily.  May take additional dose if needed. Singulair (montelukast) mg daily. - Start Dymista 1 spray each nostril twice a day as needed for runny or stuffy nose.   With using nasal sprays point tip of bottle toward eye on same side nostril and lean head slightly forward for best technique.   - Recommend nasal saline rinses 3-7 times a week to remove allergens from the nasal cavities as well as help with mucous clearance (this is especially helpful to do before the nasal sprays are given) - Consider allergy shots as a means of long-term control.  Discussed both ways to start today including traditional and [facility] protocol.  Information packet provided.  Will have our [facility] schedulers contact you to get scheduled.   - Allergy shots "re-train" and "reset" the immune system to ignore environmental allergens and decrease the resulting immune response to those allergens (sneezing, itchy watery eyes, runny nose, nasal congestion, etc).    - Allergy shots improve symptoms in 80-85% of patients.   - Have access to albuterol inhaler 2 puffs every 4-6 hours as needed for cough/wheeze/shortness of breath/chest tightness.  May use 15-20 minutes prior to activity.   Monitor frequency of use.    Follow-up in 6 months or sooner if needed

## 2023-08-02 ENCOUNTER — Other Ambulatory Visit (HOSPITAL_BASED_OUTPATIENT_CLINIC_OR_DEPARTMENT_OTHER): Payer: Self-pay

## 2023-08-04 NOTE — Addendum Note (Signed)
Addended by: Lorrin Mais on: 08/04/2023 05:47 PM   Modules accepted: Orders

## 2023-08-04 NOTE — Telephone Encounter (Signed)
Spoke with patient regarding scheduling for [facility]. Patient has not called her insurance yet however is going to call now and call me back.

## 2023-08-04 NOTE — Telephone Encounter (Signed)
Patient has been schedule for [facility] on 08/28/2023 @ 8:30 am with Dr. Maurine Minister. Dr. Delorse Lek can you write Megan Norris AIT script please.

## 2023-08-07 DIAGNOSIS — J301 Allergic rhinitis due to pollen: Secondary | ICD-10-CM | POA: Diagnosis not present

## 2023-08-07 NOTE — Progress Notes (Signed)
VIALS EXP 08-06-24

## 2023-08-07 NOTE — Progress Notes (Signed)
Aeroallergen Immunotherapy   Ordering Provider: Dr. Margo Aye   Patient Details  Name: Megan Norris  MRN: [id number]  Date of Birth: Nov 02, 1990   Order 2 of 2   Vial Label: Mold, mite   0.2 ml (Volume)  1:20 Concentration -- Alternaria alternata  0.2 ml (Volume)  1:20 Concentration -- Drechslera spicifera  0.5 ml (Volume)   AU Concentration -- Mite Mix (DF 5,000 & DP 5,000)    0.9  ml Extract Subtotal  4.1  ml Diluent  5.0  ml Maintenance Total   Schedule: [facility] then schedule B  Silver Vial (1:1,000,000): Schedule B (6 doses)  Blue Vial (1:100,000): Schedule B (6 doses)  Yellow Vial (1:10,000): Schedule B (6 doses)  Green Vial (1:1,000): Schedule B (6 doses)  Red Vial (1:100): Schedule A (14 doses)   Special Instructions:  [facility] in gso.   Home base gso.  After completion of the first Red Vial, please space to every two weeks. After completion of the second Red Vial, please space to every 4 weeks. Ok to up dose new vials at 0.8mL --> 0.3 mL --> 0.5 mL.  Ok to come twice weekly, if desired, as long as there is 48 hours between injections.

## 2023-08-07 NOTE — Progress Notes (Signed)
Aeroallergen Immunotherapy   Ordering Provider: Dr. Margo Aye   Patient Details  Name: Megan Norris  MRN: [id number]  Date of Birth: 10/22/1990   Order 1 of 2   Vial Label: Pollen   0.3 ml (Volume)  BAU Concentration -- 7 Grass Mix* 100,000 ([address] [place], [place], [place], [place], [place], [place], [place])  0.2 ml (Volume)  1:20 Concentration -- [place]  [facility].2 ml (Volume)  1:10 Concentration -- Plantain English  0.5 ml (Volume)  1:20 Concentration -- Weed Mix*  0.5 ml (Volume)  1:20 Concentration -- Eastern 10 Tree Mix (also Sweet Gum)  0.2 ml (Volume)  1:20 Concentration -- Box Elder  0.2 ml (Volume)  1:10 Concentration -- Cedar, red  0.2 ml (Volume)  1:10 Concentration -- Pecan Pollen  0.2 ml (Volume)  1:20 Concentration -- Walnut, Black Pollen    2.3  ml Extract Subtotal  2.7  ml Diluent  5.0  ml Maintenance Total   Schedule:  [facility] then schedule B  Silver Vial (1:1,000,000): Schedule B (6 doses)  Blue Vial (1:100,000): Schedule B (6 doses)  Yellow Vial (1:10,000): Schedule B (6 doses)  Green Vial (1:1,000): Schedule B (6 doses)  Red Vial (1:100): Schedule A (14 doses)   Special Instructions: [facility] in gso.   Home base gso.  After completion of the first Red Vial, please space to every two weeks. After completion of the second Red Vial, please space to every 4 weeks. Ok to up dose new vials at 0.33mL --> 0.3 mL --> 0.5 mL.  Ok to come twice weekly, if desired, as long as there is 48 hours between injections.

## 2023-08-08 ENCOUNTER — Other Ambulatory Visit (HOSPITAL_BASED_OUTPATIENT_CLINIC_OR_DEPARTMENT_OTHER): Payer: Self-pay

## 2023-08-08 ENCOUNTER — Other Ambulatory Visit: Payer: Self-pay

## 2023-08-08 DIAGNOSIS — J3089 Other allergic rhinitis: Secondary | ICD-10-CM | POA: Diagnosis not present

## 2023-08-08 MED ORDER — PREDNISONE 20 MG PO TABS
ORAL_TABLET | ORAL | 0 refills | Status: DC
  Filled 2023-08-08: qty 4, 2d supply, fill #0

## 2023-08-08 MED ORDER — EPINEPHRINE 0.3 MG/0.3ML IJ SOAJ
0.3000 mg | INTRAMUSCULAR | 1 refills | Status: DC | PRN
Start: 2023-08-08 — End: 2023-08-25
  Filled 2023-08-08: qty 2, 30d supply, fill #0

## 2023-08-08 MED ORDER — FAMOTIDINE 20 MG PO TABS
ORAL_TABLET | ORAL | 0 refills | Status: DC
  Filled 2023-08-08: qty 4, 2d supply, fill #0

## 2023-08-08 NOTE — Addendum Note (Signed)
Addended by: Dub Mikes on: 08/08/2023 05:11 PM   Modules accepted: Orders

## 2023-08-09 ENCOUNTER — Other Ambulatory Visit: Payer: Self-pay

## 2023-08-09 ENCOUNTER — Other Ambulatory Visit (HOSPITAL_BASED_OUTPATIENT_CLINIC_OR_DEPARTMENT_OTHER): Payer: Self-pay

## 2023-08-10 ENCOUNTER — Other Ambulatory Visit (HOSPITAL_BASED_OUTPATIENT_CLINIC_OR_DEPARTMENT_OTHER): Payer: Self-pay

## 2023-08-10 MED ORDER — AMPHETAMINE-DEXTROAMPHET ER 30 MG PO CP24
30.0000 mg | ORAL_CAPSULE | Freq: Every day | ORAL | 0 refills | Status: DC
  Filled 2023-11-08 – 2023-11-11 (×3): qty 30, 30d supply, fill #0

## 2023-08-10 MED ORDER — LURASIDONE HCL 40 MG PO TABS
40.0000 mg | ORAL_TABLET | Freq: Every evening | ORAL | 0 refills | Status: DC
  Filled 2023-08-10: qty 30, 30d supply, fill #0
  Filled 2023-10-12: qty 30, 30d supply, fill #1

## 2023-08-10 MED ORDER — AMPHETAMINE-DEXTROAMPHET ER 30 MG PO CP24
30.0000 mg | ORAL_CAPSULE | Freq: Every day | ORAL | 0 refills | Status: DC
  Filled 2023-10-12: qty 30, 30d supply, fill #0

## 2023-08-10 MED ORDER — AMPHETAMINE-DEXTROAMPHET ER 30 MG PO CP24
30.0000 mg | ORAL_CAPSULE | Freq: Every day | ORAL | 0 refills | Status: DC
  Filled 2023-08-10: qty 30, 30d supply, fill #0

## 2023-08-12 ENCOUNTER — Other Ambulatory Visit (HOSPITAL_BASED_OUTPATIENT_CLINIC_OR_DEPARTMENT_OTHER): Payer: Self-pay

## 2023-08-22 NOTE — Telephone Encounter (Signed)
Patient was able to pick up all of her pre-medications and is all set for her [facility] appointment.

## 2023-08-25 ENCOUNTER — Other Ambulatory Visit: Payer: Self-pay | Admitting: *Deleted

## 2023-08-25 MED ORDER — PREDNISONE 20 MG PO TABS: ORAL_TABLET | ORAL | 0 refills | Status: DC

## 2023-08-25 MED ORDER — FAMOTIDINE 20 MG PO TABS: ORAL_TABLET | ORAL | 0 refills | Status: DC

## 2023-08-25 MED ORDER — EPINEPHRINE 0.3 MG/0.3ML IJ SOAJ: 0.3000 mg | INTRAMUSCULAR | 1 refills | Status: AC | PRN

## 2023-08-28 ENCOUNTER — Other Ambulatory Visit: Payer: Self-pay

## 2023-08-28 ENCOUNTER — Ambulatory Visit (INDEPENDENT_AMBULATORY_CARE_PROVIDER_SITE_OTHER): Payer: Medicaid Other | Admitting: Internal Medicine

## 2023-08-28 ENCOUNTER — Other Ambulatory Visit (HOSPITAL_BASED_OUTPATIENT_CLINIC_OR_DEPARTMENT_OTHER): Payer: Self-pay

## 2023-08-28 ENCOUNTER — Encounter: Payer: Self-pay | Admitting: Internal Medicine

## 2023-08-28 VITALS — BP 138/70 | HR 72 | Temp 97.8°F | Resp 18

## 2023-08-28 DIAGNOSIS — J302 Other seasonal allergic rhinitis: Secondary | ICD-10-CM

## 2023-08-28 DIAGNOSIS — J3089 Other allergic rhinitis: Secondary | ICD-10-CM | POA: Insufficient documentation

## 2023-08-28 NOTE — Progress Notes (Signed)
 RAPID DESENSITIZATION Note  RE: Megan Norris MRN: [id number] DOB: 1990-12-22 Date of Office Visit: 08/28/2023  Subjective:  Patient presents today for rapid desensitization.  Interval History: Patient has not been ill, she has taken all premedications as per protocol.  Recent/Current History: Pulmonary disease: no Cardiac disease: no Respiratory infection: no Rash: no Itch: no Swelling: no Cough: no Shortness of breath: no Runny/stuffy nose: no Itchy eyes: no Beta-blocker use: no  Patient/guardian was informed of the procedure with verbalized understanding of the risk of anaphylaxis. Consent has been signed.   Medication List:  Current Outpatient Medications  Medication Sig Dispense Refill   albuterol  (VENTOLIN  HFA) 108 (90 Base) MCG/ACT inhaler Inhale 2 puffs into the lungs every 4 (four) hours as needed for wheezing. 8.5 g 1   amphetamine -dextroamphetamine  (ADDERALL XR) 30 MG 24 hr capsule Take 1 capsule (30 mg total) by mouth daily. 30 capsule 0   amphetamine -dextroamphetamine  (ADDERALL XR) 30 MG 24 hr capsule Take 1 capsule (30 mg total) by mouth daily. 30 capsule 0   amphetamine -dextroamphetamine  (ADDERALL XR) 30 MG 24 hr capsule Take 1 capsule (30 mg total) by mouth daily. 30 capsule 0   amphetamine -dextroamphetamine  (ADDERALL XR) 30 MG 24 hr capsule Take 1 capsule (30 mg total) by mouth daily. 30 capsule 0   [START ON 10/04/2023] amphetamine -dextroamphetamine  (ADDERALL XR) 30 MG 24 hr capsule Take 1 capsule (30 mg total) by mouth daily. 30 capsule 0   [START ON 09/06/2023] amphetamine -dextroamphetamine  (ADDERALL XR) 30 MG 24 hr capsule Take 1 capsule (30 mg total) by mouth daily. 30 capsule 0   Azelastine -Fluticasone  (DYMISTA ) 137-50 MCG/ACT SUSP Place 1 spray into both nostrils in the morning and at bedtime. 23 g 5   EPINEPHrine  0.3 mg/0.3 mL IJ SOAJ injection Inject 0.3 mg into the muscle as needed for anaphylaxis. 2 each 1   Ergocalciferol  (VITAMIN D2) 50 MCG (2000  UT) TABS Take 1 tablet by mouth daily. 90 tablet 1   famotidine  (PEPCID ) 20 MG tablet Take 1 tablet (20 mg) the morning and evening before, and 1 tablet (20 mg) the morning and evening of [facility]. 4 tablet 0   Ferrous Sulfate (IRON PO) Take 1 tablet by mouth daily.     fluticasone  (FLONASE ALLERGY  RELIEF) 50 MCG/ACT nasal spray Place 2 sprays into both nostrils daily.     ibuprofen  (ADVIL ) 800 MG tablet Take 800 mg by mouth 3 (three) times daily.     levocetirizine (XYZAL ) 5 MG tablet Take 1 tablet (5 mg total) by mouth every evening. 30 tablet 5   levonorgestrel (MIRENA) 20 MCG/DAY IUD 1 each by Intrauterine route once.     lurasidone  (LATUDA ) 40 MG TABS tablet Take one-half tablets (20 mg total) by mouth at bedtime for 6 days, THEN 1 tablet (40 mg total) at bedtime for 27 days. Take with food. 30 tablet 0   lurasidone  (LATUDA ) 40 MG TABS tablet Take 1 tablet (40 mg total) by mouth at bedtime with food. 90 tablet 0   montelukast  (SINGULAIR ) 10 MG tablet Take 1 tablet (10 mg total) by mouth at bedtime. 90 tablet 2   Olopatadine -Mometasone  (RYALTRIS ) 665-25 MCG/ACT SUSP Place 2 sprays into the nose 2 (two) times daily as needed. (Patient not taking: Reported on 07/27/2023) 29 g 5   pantoprazole  (PROTONIX ) 40 MG tablet Take 1 tablet (40 mg total) by mouth daily. 90 tablet 2   predniSONE  (DELTASONE ) 20 MG tablet Take 40 mg (2 tablets) the morning before and 40  mg (2 tablets) the morning of [facility]. 4 tablet 0   scopolamine  (TRANSDERM-SCOP) 1 MG/3DAYS Place 1 patch (1.5 mg total) onto the skin every 3 (three) days. (Patient not taking: Reported on 07/27/2023) 4 patch 0   Semaglutide -Weight Management (WEGOVY ) 0.5 MG/0.5ML SOAJ Inject 0.5 mg into the skin once a week. 2 mL 0   SODIUM FLUORIDE , DENTAL RINSE, (PREVIDENT) 0.2 % SOLN USE AS AN ORAL RINSE, AS DIRECTED ON PACKAGE 473 mL 5   valACYclovir  (VALTREX ) 1000 MG tablet Take 1 tablet (1,000 mg total) by mouth every 12 (twelve) hours.  14 tablet 0   valACYclovir  (VALTREX ) 1000 MG tablet Take 1 tablet (1,000 mg total) by mouth daily. 90 tablet 3   No current facility-administered medications for this visit.   Allergies: Allergies  Allergen Reactions   Sulfa Antibiotics Other (See Comments), Swelling and Nausea And Vomiting    Swelling from sulfa eye drops as a child   I reviewed her past medical history, social history, family history, and environmental history and no significant changes have been reported from her previous visit.  ROS: Negative except as per HPI.  Objective: There were no vitals taken for this visit. There is no height or weight on file to calculate BMI.   General Appearance:  Alert, cooperative, no distress, appears stated age  Head:  Normocephalic, without obvious abnormality, atraumatic  Eyes:  Conjunctiva clear, EOM's intact  Nose: Nares normal  Throat: Lips, tongue normal; teeth and gums normal, normal posterior oropharnyx  Neck: Supple, symmetrical  Lungs:   CTAB, Respirations unlabored, no coughing  Heart:  Appears well perfused  Extremities: No edema  Skin: Skin color, texture, turgor normal, no rashes or lesions on visualized portions of skin  Neurologic: No gross deficits     Diagnostics:  PROCEDURES:  Patient received the following doses every hour: Step 1:  0.42ml - 1:1,000,000 dilution (silver vial) Step 2:  0.18ml - 1:1,000,000 dilution (silver vial) Step 3: 0.17ml - 1:100,000 dilution (blue vial)  Step 4: 0.3ml - 1:100,000 dilution (blue vial)  Step 5: 0.1ml - 1:10,000 dilution (gold vial) Step 6: 0.2ml - 1:10,000 dilution (gold vial) Step 7: 0.3ml - 1:10,000 dilution (gold vial) Step 8: 0.67ml - 1:10,000 dilution (gold vial)  Patient was observed for 1 hour after the last dose.   Procedure started at 08:35 AM Procedure ended at 2:02 PM   ASSESSMENT/PLAN:   Patient has tolerated the rapid desensitization protocol.  Next appointment: Start at 0.05ml of 1:1000  dilution (green vial) and build up per protocol.

## 2023-08-29 ENCOUNTER — Other Ambulatory Visit (HOSPITAL_BASED_OUTPATIENT_CLINIC_OR_DEPARTMENT_OTHER): Payer: Self-pay

## 2023-08-31 ENCOUNTER — Ambulatory Visit (INDEPENDENT_AMBULATORY_CARE_PROVIDER_SITE_OTHER): Payer: Medicaid Other | Admitting: Nurse Practitioner

## 2023-08-31 ENCOUNTER — Encounter: Payer: Self-pay | Admitting: Nurse Practitioner

## 2023-08-31 VITALS — BP 120/70 | HR 80 | Temp 98.0°F | Ht 64.0 in | Wt 209.2 lb

## 2023-08-31 DIAGNOSIS — Z6835 Body mass index (BMI) 35.0-35.9, adult: Secondary | ICD-10-CM

## 2023-08-31 DIAGNOSIS — E66812 Obesity, class 2: Secondary | ICD-10-CM | POA: Diagnosis not present

## 2023-08-31 DIAGNOSIS — E785 Hyperlipidemia, unspecified: Secondary | ICD-10-CM | POA: Insufficient documentation

## 2023-08-31 DIAGNOSIS — E6609 Other obesity due to excess calories: Secondary | ICD-10-CM | POA: Diagnosis not present

## 2023-08-31 DIAGNOSIS — Z2821 Immunization not carried out because of patient refusal: Secondary | ICD-10-CM

## 2023-08-31 NOTE — Progress Notes (Signed)
 LILLETTE Kristeen JINNY Gladis, CMA,acting as a neurosurgeon for Megan Ada, FNP.,have documented all relevant documentation on the behalf of Megan Ada, FNP,as directed by  Megan Ada, FNP while in the presence of Megan Ada, FNP.  Subjective:  Patient ID: Megan Norris , female    DOB: 07-02-1991 , 33 y.o.   MRN: [id number]  Chief Complaint  Patient presents with   Obesity    HPI  Patient presents today for a weight follow up, Patient reports compliance with medication. Patient denies any chest pain, SOB, or headaches. Patient's wegovy  was denied she would like to discuss other options. Patient reports she has tried and failed dieting and she works out at least 3 days a week.    Breakfast - may skip - or egg bites, protein shake and water, turkey bacon Lunch - salmon or air fried chicken wings Dinner - turkey meatballs, sweet potato, broccoli and cheese During her menstrual cycle she will crave sweets otherwise eats fruit.  She tries to drink 80 oz of water a day.   Exercise mostly weight lifting 3 times a week for one hour cardio 30-45 minutes.  She is taking adderal for her ADHD and Lorazedone.  She is now on allergy  injections with Dr Jeneal      Past Medical History:  Diagnosis Date   ADHD    Allergy     Asthma    Cervical dysplasia    Eczema    False positive HIV serology    GERD (gastroesophageal reflux disease)    Heart murmur    HPV (human papilloma virus) infection 2018     Family History  Problem Relation Age of Onset   Allergic rhinitis Mother    Hyperlipidemia Mother    Hypertension Mother    Allergic rhinitis Father    Hyperlipidemia Father    Allergic rhinitis Sister    Hypertension Sister    Stroke Paternal Uncle    Stroke Maternal Grandmother    Breast cancer Maternal Grandmother    Diabetes Maternal Grandmother    Hypertension Maternal Grandmother    Hyperlipidemia Maternal Grandmother    Heart disease Maternal Grandmother    Heart attack Maternal  Grandmother    Cancer Maternal Grandmother    Kidney disease Maternal Grandmother    Cancer Maternal Grandfather    Stroke Paternal Grandmother    Diabetes Paternal Grandmother    Cancer Maternal Uncle    Cancer Maternal Uncle      Current Outpatient Medications:    albuterol  (VENTOLIN  HFA) 108 (90 Base) MCG/ACT inhaler, Inhale 2 puffs into the lungs every 4 (four) hours as needed for wheezing., Disp: 8.5 g, Rfl: 1   amphetamine -dextroamphetamine  (ADDERALL XR) 30 MG 24 hr capsule, Take 1 capsule (30 mg total) by mouth daily., Disp: 30 capsule, Rfl: 0   amphetamine -dextroamphetamine  (ADDERALL XR) 30 MG 24 hr capsule, Take 1 capsule (30 mg total) by mouth daily., Disp: 30 capsule, Rfl: 0   amphetamine -dextroamphetamine  (ADDERALL XR) 30 MG 24 hr capsule, Take 1 capsule (30 mg total) by mouth daily., Disp: 30 capsule, Rfl: 0   amphetamine -dextroamphetamine  (ADDERALL XR) 30 MG 24 hr capsule, Take 1 capsule (30 mg total) by mouth daily., Disp: 30 capsule, Rfl: 0   [START ON 10/04/2023] amphetamine -dextroamphetamine  (ADDERALL XR) 30 MG 24 hr capsule, Take 1 capsule (30 mg total) by mouth daily., Disp: 30 capsule, Rfl: 0   [START ON 09/06/2023] amphetamine -dextroamphetamine  (ADDERALL XR) 30 MG 24 hr capsule, Take 1 capsule (30 mg total) by mouth  daily., Disp: 30 capsule, Rfl: 0   Azelastine -Fluticasone  (DYMISTA ) 137-50 MCG/ACT SUSP, Place 1 spray into both nostrils in the morning and at bedtime., Disp: 23 g, Rfl: 5   EPINEPHrine  0.3 mg/0.3 mL IJ SOAJ injection, Inject 0.3 mg into the muscle as needed for anaphylaxis., Disp: 2 each, Rfl: 1   Ergocalciferol  (VITAMIN D2) 50 MCG (2000 UT) TABS, Take 1 tablet by mouth daily., Disp: 90 tablet, Rfl: 1   famotidine  (PEPCID ) 20 MG tablet, Take 1 tablet (20 mg) the morning and evening before, and 1 tablet (20 mg) the morning and evening of [facility]., Disp: 4 tablet, Rfl: 0   Ferrous Sulfate (IRON PO), Take 1 tablet by mouth daily., Disp: , Rfl:     fluticasone  (FLONASE ALLERGY  RELIEF) 50 MCG/ACT nasal spray, Place 2 sprays into both nostrils daily., Disp: , Rfl:    ibuprofen  (ADVIL ) 800 MG tablet, Take 800 mg by mouth 3 (three) times daily., Disp: , Rfl:    levocetirizine (XYZAL ) 5 MG tablet, Take 1 tablet (5 mg total) by mouth every evening., Disp: 30 tablet, Rfl: 5   levonorgestrel (MIRENA) 20 MCG/DAY IUD, 1 each by Intrauterine route once., Disp: , Rfl:    lurasidone  (LATUDA ) 40 MG TABS tablet, Take 1 tablet (40 mg total) by mouth at bedtime with food., Disp: 90 tablet, Rfl: 0   montelukast  (SINGULAIR ) 10 MG tablet, Take 1 tablet (10 mg total) by mouth at bedtime., Disp: 90 tablet, Rfl: 2   Olopatadine -Mometasone  (RYALTRIS ) 665-25 MCG/ACT SUSP, Place 2 sprays into the nose 2 (two) times daily as needed., Disp: 29 g, Rfl: 5   pantoprazole  (PROTONIX ) 40 MG tablet, Take 1 tablet (40 mg total) by mouth daily., Disp: 90 tablet, Rfl: 2   predniSONE  (DELTASONE ) 20 MG tablet, Take 40 mg (2 tablets) the morning before and 40 mg (2 tablets) the morning of [facility]., Disp: 4 tablet, Rfl: 0   scopolamine  (TRANSDERM-SCOP) 1 MG/3DAYS, Place 1 patch (1.5 mg total) onto the skin every 3 (three) days., Disp: 4 patch, Rfl: 0   SODIUM FLUORIDE , DENTAL RINSE, (PREVIDENT) 0.2 % SOLN, USE AS AN ORAL RINSE, AS DIRECTED ON PACKAGE, Disp: 473 mL, Rfl: 5   valACYclovir  (VALTREX ) 1000 MG tablet, Take 1 tablet (1,000 mg total) by mouth every 12 (twelve) hours., Disp: 14 tablet, Rfl: 0   valACYclovir  (VALTREX ) 1000 MG tablet, Take 1 tablet (1,000 mg total) by mouth daily., Disp: 90 tablet, Rfl: 3   lurasidone  (LATUDA ) 40 MG TABS tablet, Take one-half tablets (20 mg total) by mouth at bedtime for 6 days, THEN 1 tablet (40 mg total) at bedtime for 27 days. Take with food., Disp: 30 tablet, Rfl: 0   Semaglutide -Weight Management (WEGOVY ) 0.5 MG/0.5ML SOAJ, Inject 0.5 mg into the skin once a week. (Patient not taking: Reported on 08/31/2023), Disp: 2 mL, Rfl: 0    Allergies  Allergen Reactions   Sulfa Antibiotics Other (See Comments), Swelling and Nausea And Vomiting    Swelling from sulfa eye drops as a child   Bee Venom Hives and Itching     Review of Systems  Constitutional: Negative.   Respiratory: Negative.    Cardiovascular: Negative.   Neurological: Negative.   Psychiatric/Behavioral: Negative.       Today's Vitals   08/31/23 0933  BP: 120/70  Pulse: 80  Temp: 98 F (36.7 C)  TempSrc: Oral  Weight: 209 lb 3.2 oz (94.9 kg)  Height: 5' 4 (1.626 m)  PainSc: 0-No pain  Body mass index is 35.91 kg/m.  Wt Readings from Last 3 Encounters:  08/31/23 209 lb 3.2 oz (94.9 kg)  07/06/23 208 lb 3.2 oz (94.4 kg)  12/28/22 185 lb 9.6 oz (84.2 kg)     Objective:  Physical Exam Vitals reviewed.  Constitutional:      General: She is not in acute distress.    Appearance: Normal appearance. She is obese.  Pulmonary:     Effort: Pulmonary effort is normal. No respiratory distress.  Neurological:     General: No focal deficit present.     Mental Status: She is alert and oriented to person, place, and time.     Cranial Nerves: No cranial nerve deficit.     Motor: No weakness.         Assessment And Plan:  Dyslipidemia Assessment & Plan: Cholesterol levels are increased at last visit. Continue focusing on low fat diet.    COVID-19 vaccination declined Assessment & Plan: Declines covid 19 vaccine. Discussed risk of covid 52 and if she changes her mind about the vaccine to call the office. Education has been provided regarding the importance of this vaccine but patient still declined. Advised may receive this vaccine at local pharmacy or Health Dept.or vaccine clinic. Aware to provide a copy of the vaccination record if obtained from local pharmacy or Health Dept.  Encouraged to take multivitamin, vitamin d , vitamin c and zinc to increase immune system. Aware can call office if would like to have vaccine here at office. Verbalized  acceptance and understanding.    Class 2 obesity due to excess calories with body mass index (BMI) of 35.0 to 35.9 in adult, unspecified whether serious comorbidity present Assessment & Plan: She has had continued difficulty with losing weight in spite of exercises 3-4 days a week and eating a healthy diet. Will try to get her approved for Wegovy . She is taking Adderal and Lorazedone so phentermine and contrave are contraindications.     No follow-ups on file.   Patient was given opportunity to ask questions. Patient verbalized understanding of the plan and was able to repeat key elements of the plan. All questions were answered to their satisfaction.   LILLETTE Megan Ada, FNP, have reviewed all documentation for this visit. The documentation on 08/31/23 for the exam, diagnosis, procedures, and orders are all accurate and complete.   IF YOU HAVE BEEN REFERRED TO A SPECIALIST, IT MAY TAKE 1-2 WEEKS TO SCHEDULE/PROCESS THE REFERRAL. IF YOU HAVE NOT HEARD FROM US /SPECIALIST IN TWO WEEKS, PLEASE GIVE US  A CALL AT [phone].

## 2023-09-04 NOTE — Assessment & Plan Note (Addendum)
 She has had continued difficulty with losing weight in spite of exercises 3-4 days a week and eating a healthy diet. Will try to get her approved for [facility]. She is taking Adderal and Lorazedone so phentermine and contrave are contraindications.

## 2023-09-04 NOTE — Assessment & Plan Note (Signed)

## 2023-09-04 NOTE — Assessment & Plan Note (Signed)
 Cholesterol levels are increased at last visit. Continue focusing on low fat diet.

## 2023-09-08 ENCOUNTER — Ambulatory Visit (INDEPENDENT_AMBULATORY_CARE_PROVIDER_SITE_OTHER): Payer: Self-pay

## 2023-09-08 DIAGNOSIS — J309 Allergic rhinitis, unspecified: Secondary | ICD-10-CM

## 2023-09-12 ENCOUNTER — Encounter: Payer: Self-pay | Admitting: Nurse Practitioner

## 2023-09-15 ENCOUNTER — Ambulatory Visit (INDEPENDENT_AMBULATORY_CARE_PROVIDER_SITE_OTHER): Payer: Self-pay

## 2023-09-15 DIAGNOSIS — J309 Allergic rhinitis, unspecified: Secondary | ICD-10-CM

## 2023-09-19 ENCOUNTER — Other Ambulatory Visit: Payer: Self-pay | Admitting: Nurse Practitioner

## 2023-09-19 DIAGNOSIS — E66811 Obesity, class 1: Secondary | ICD-10-CM

## 2023-09-20 ENCOUNTER — Ambulatory Visit (INDEPENDENT_AMBULATORY_CARE_PROVIDER_SITE_OTHER): Payer: Self-pay

## 2023-09-20 DIAGNOSIS — J309 Allergic rhinitis, unspecified: Secondary | ICD-10-CM

## 2023-09-28 ENCOUNTER — Ambulatory Visit (INDEPENDENT_AMBULATORY_CARE_PROVIDER_SITE_OTHER): Payer: Medicaid Other

## 2023-09-28 DIAGNOSIS — J309 Allergic rhinitis, unspecified: Secondary | ICD-10-CM | POA: Diagnosis not present

## 2023-10-02 ENCOUNTER — Ambulatory Visit (INDEPENDENT_AMBULATORY_CARE_PROVIDER_SITE_OTHER): Payer: Self-pay | Admitting: *Deleted

## 2023-10-02 DIAGNOSIS — J309 Allergic rhinitis, unspecified: Secondary | ICD-10-CM

## 2023-10-13 ENCOUNTER — Other Ambulatory Visit (HOSPITAL_BASED_OUTPATIENT_CLINIC_OR_DEPARTMENT_OTHER): Payer: Self-pay

## 2023-10-13 ENCOUNTER — Other Ambulatory Visit: Payer: Self-pay

## 2023-10-13 ENCOUNTER — Ambulatory Visit (INDEPENDENT_AMBULATORY_CARE_PROVIDER_SITE_OTHER): Payer: Medicaid Other | Admitting: *Deleted

## 2023-10-13 DIAGNOSIS — J309 Allergic rhinitis, unspecified: Secondary | ICD-10-CM | POA: Diagnosis not present

## 2023-10-16 ENCOUNTER — Other Ambulatory Visit (HOSPITAL_BASED_OUTPATIENT_CLINIC_OR_DEPARTMENT_OTHER): Payer: Self-pay

## 2023-10-18 ENCOUNTER — Other Ambulatory Visit (HOSPITAL_BASED_OUTPATIENT_CLINIC_OR_DEPARTMENT_OTHER): Payer: Self-pay

## 2023-10-23 ENCOUNTER — Ambulatory Visit (INDEPENDENT_AMBULATORY_CARE_PROVIDER_SITE_OTHER): Admitting: *Deleted

## 2023-10-23 DIAGNOSIS — J309 Allergic rhinitis, unspecified: Secondary | ICD-10-CM | POA: Diagnosis not present

## 2023-10-31 ENCOUNTER — Ambulatory Visit (INDEPENDENT_AMBULATORY_CARE_PROVIDER_SITE_OTHER): Payer: Self-pay | Admitting: *Deleted

## 2023-10-31 DIAGNOSIS — J309 Allergic rhinitis, unspecified: Secondary | ICD-10-CM | POA: Diagnosis not present

## 2023-11-01 ENCOUNTER — Other Ambulatory Visit (HOSPITAL_BASED_OUTPATIENT_CLINIC_OR_DEPARTMENT_OTHER): Payer: Self-pay

## 2023-11-01 MED ORDER — AMPHETAMINE-DEXTROAMPHET ER 30 MG PO CP24
30.0000 mg | ORAL_CAPSULE | Freq: Every day | ORAL | 0 refills | Status: AC
  Filled 2024-02-16: qty 30, 30d supply, fill #0

## 2023-11-01 MED ORDER — LURASIDONE HCL 60 MG PO TABS
60.0000 mg | ORAL_TABLET | Freq: Every day | ORAL | 0 refills | Status: DC
  Filled 2023-11-01: qty 30, 30d supply, fill #0

## 2023-11-06 ENCOUNTER — Ambulatory Visit (INDEPENDENT_AMBULATORY_CARE_PROVIDER_SITE_OTHER): Payer: Medicaid Other | Admitting: Internal Medicine

## 2023-11-06 ENCOUNTER — Encounter (INDEPENDENT_AMBULATORY_CARE_PROVIDER_SITE_OTHER): Payer: Self-pay | Admitting: Internal Medicine

## 2023-11-06 VITALS — BP 103/67 | HR 89 | Temp 98.0°F | Ht 63.5 in | Wt 199.0 lb

## 2023-11-06 DIAGNOSIS — E66812 Obesity, class 2: Secondary | ICD-10-CM

## 2023-11-06 DIAGNOSIS — E7800 Pure hypercholesterolemia, unspecified: Secondary | ICD-10-CM

## 2023-11-06 DIAGNOSIS — Z6834 Body mass index (BMI) 34.0-34.9, adult: Secondary | ICD-10-CM | POA: Diagnosis not present

## 2023-11-06 DIAGNOSIS — Z0289 Encounter for other administrative examinations: Secondary | ICD-10-CM

## 2023-11-06 NOTE — Assessment & Plan Note (Signed)
 LDL is not at goal. Elevated LDL may be secondary to nutrition, genetics and spillover effect from excess adiposity. Recommended LDL goal is <70 to reduce the risk of fatty streaks and the progression to obstructive ASCVD in the future.   Her 10 year risk is: The ASCVD Risk score (Arnett DK, et al., 2019) failed to calculate for the following reasons:   The 2019 ASCVD risk score is only valid for ages 17 to 37  Lab Results  Component Value Date   CHOL 205 (H) 07/06/2023   HDL 72 07/06/2023   LDLCALC 121 (H) 07/06/2023   TRIG 65 07/06/2023   CHOLHDL 2.8 07/06/2023    Patient has a low cardiovascular risk therefore does not warrant statin therapy she would benefit from diet low on saturated fats.  This will be included as part of her nutrition counseling at intake appointment

## 2023-11-06 NOTE — Progress Notes (Signed)
 Office: [phone]  /  Fax: [phone]   Initial Visit  Megan Norris was seen in clinic today to evaluate for obesity. She is interested in losing weight to improve overall health and reduce the risk of weight related complications. She presents today to review program treatment options, initial physical assessment, and evaluation.     She was referred by: PCP  When asked what else they would like to accomplish? She states: Adopt healthier eating patterns and Improve quality of life  When asked how has your weight affected you? She states: Has affected self-esteem and Contributed to medical problems  Weight history: Started to gain weight in middle school  Some associated conditions: Hyperlipidemia  Contributing factors: Family history of obesity and Use of obesogenic medications: Psychotropic medications  Weight promoting medications identified: Psychotropic medications  Current nutrition plan: Portion control / smart choices  Current level of physical activity: Other: 2-3 days a week weight and some cardio.   Current or previous pharmacotherapy: None  Response to medication: Never tried medications   Past medical history includes:   Past Medical History:  Diagnosis Date   ADHD    Allergy    Asthma    Cervical dysplasia    Eczema    False positive HIV serology    GERD (gastroesophageal reflux disease)    Heart murmur    HPV (human papilloma virus) infection 2018     Objective:   BP 103/67   Pulse 89   Temp 98 F (36.7 C)   Ht 5' 3.5" (1.613 m)   Wt 199 lb (90.3 kg)   SpO2 98%   BMI 34.70 kg/m  She was weighed on the bioimpedance scale: Body mass index is 34.7 kg/m.  Peak Weight:211 , Body Fat%:38, Visceral Fat Rating:8, Weight trend over the last 12 months: Decreasing  General:  Alert, oriented and cooperative. Patient is in no acute distress.  Respiratory: Normal respiratory effort, no problems with respiration noted   Gait: able to ambulate  independently  Mental Status: Normal mood and affect. Normal behavior. Normal judgment and thought content.   DIAGNOSTIC DATA REVIEWED:  BMET    Component Value Date/Time   NA 137 07/06/2023 1156   K 4.6 07/06/2023 1156   CL 98 07/06/2023 1156   CO2 25 07/06/2023 1156   GLUCOSE 72 07/06/2023 1156   GLUCOSE 93 03/04/2018 0616   BUN 22 (H) 07/06/2023 1156   CREATININE 0.93 07/06/2023 1156   CALCIUM 9.6 07/06/2023 1156   GFRNONAA >60 03/04/2018 0616   GFRAA >60 03/04/2018 0616   Lab Results  Component Value Date   HGBA1C 5.6 06/06/2022   HGBA1C 5.5 06/01/2021   No results found for: "INSULIN" CBC    Component Value Date/Time   WBC 5.4 07/06/2023 1156   WBC 7.1 03/04/2018 0616   RBC 4.33 07/06/2023 1156   RBC 4.24 03/04/2018 0616   HGB 13.5 07/06/2023 1156   HCT 40.4 07/06/2023 1156   PLT 241 07/06/2023 1156   MCV 93 07/06/2023 1156   MCH 31.2 07/06/2023 1156   MCH 30.9 03/04/2018 0616   MCHC 33.4 07/06/2023 1156   MCHC 33.9 03/04/2018 0616   RDW 13.4 07/06/2023 1156   Iron/TIBC/Ferritin/ %Sat No results found for: "IRON", "TIBC", "FERRITIN", "IRONPCTSAT" Lipid Panel     Component Value Date/Time   CHOL 205 (H) 07/06/2023 1156   TRIG 65 07/06/2023 1156   HDL 72 07/06/2023 1156   CHOLHDL 2.8 07/06/2023 1156   LDLCALC 121 (H)  07/06/2023 1156   Hepatic Function Panel     Component Value Date/Time   PROT 7.3 07/06/2023 1156   ALBUMIN 4.4 07/06/2023 1156   AST 18 07/06/2023 1156   ALT 12 07/06/2023 1156   ALKPHOS 43 (L) 07/06/2023 1156   BILITOT 0.3 07/06/2023 1156   No results found for: "TSH"   Assessment and Plan:   Class 2 severe obesity due to excess calories with serious comorbidity and body mass index (BMI) of 35.0 to 35.9 in adult [facility]) Assessment & Plan: Patient has family history of obesity, she may also skip a meal few times a week, she is on Adderall that causes appetite suppression and sometimes results in restrictive eating.  She is on  Jordan which may or may not contribute to weight gain depending on study.  We reviewed anthropometrics, biometrics, associated medical conditions and contributing factors with patient. she would benefit from a medically tailored reduced calorie nutrional plan based on her REE (resting energy expenditure), which will be determined by indirect calorimetry.  We will also assess for cardiometabolic risk and nutritional derangements via fasting labs at intake appointment.     Elevated LDL cholesterol level Assessment & Plan: LDL is not at goal. Elevated LDL may be secondary to nutrition, genetics and spillover effect from excess adiposity. Recommended LDL goal is <70 to reduce the risk of fatty streaks and the progression to obstructive ASCVD in the future.   Her 10 year risk is: The ASCVD Risk score (Arnett DK, et al., 2019) failed to calculate for the following reasons:   The 2019 ASCVD risk score is only valid for ages 51 to 75  Lab Results  Component Value Date   CHOL 205 (H) 07/06/2023   HDL 72 07/06/2023   LDLCALC 121 (H) 07/06/2023   TRIG 65 07/06/2023   CHOLHDL 2.8 07/06/2023    Patient has a low cardiovascular risk therefore does not warrant statin therapy she would benefit from diet low on saturated fats.  This will be included as part of her nutrition counseling at intake appointment           Obesity Treatment / Action Plan:  Patient will work on garnering support from family and friends to begin weight loss journey. Will work on eliminating or reducing the presence of highly palatable, calorie dense foods in the home. Will complete provided nutritional and psychosocial assessment questionnaire before the next appointment. Will be scheduled for indirect calorimetry to determine resting energy expenditure in a fasting state.  This will allow [place] to create a reduced calorie, high-protein meal plan to promote loss of fat mass while preserving muscle mass. Counseled on the  health benefits of losing 5%-15% of total body weight. Was counseled on nutritional approaches to weight loss and benefits of reducing processed foods and consuming plant-based foods and high quality protein as part of nutritional weight management. Was counseled on pharmacotherapy and role as an adjunct in weight management.   Obesity Education Performed Today:  She was weighed on the bioimpedance scale and results were discussed and documented in the synopsis.  We discussed obesity as a disease and the importance of a more detailed evaluation of all the factors contributing to the disease.  We discussed the importance of long term lifestyle changes which include nutrition, exercise and behavioral modifications as well as the importance of customizing this to her specific health and social needs.  We discussed the benefits of reaching a healthier weight to alleviate the symptoms of existing  conditions and reduce the risks of the biomechanical, metabolic and psychological effects of obesity.  Baylie L Romano appears to be in the action stage of change and states they are ready to start intensive lifestyle modifications and behavioral modifications.  40 minutes was spent today on this visit including the above counseling, pre-visit chart review, and post-visit documentation.  Reviewed by clinician on day of visit: allergies, medications, problem list, medical history, surgical history, family history, social history, and previous encounter notes pertinent to obesity diagnosis.   Worthy Rancher, MD

## 2023-11-06 NOTE — Assessment & Plan Note (Addendum)
 Patient has family history of obesity, she may also skip a meal few times a week, she is on Adderall that causes appetite suppression and sometimes results in restrictive eating.  She is on Jordan which may or may not contribute to weight gain depending on study.  We reviewed anthropometrics, biometrics, associated medical conditions and contributing factors with patient. she would benefit from a medically tailored reduced calorie nutrional plan based on her REE (resting energy expenditure), which will be determined by indirect calorimetry.  We will also assess for cardiometabolic risk and nutritional derangements via fasting labs at intake appointment.

## 2023-11-07 ENCOUNTER — Ambulatory Visit (INDEPENDENT_AMBULATORY_CARE_PROVIDER_SITE_OTHER): Payer: Self-pay | Admitting: *Deleted

## 2023-11-07 DIAGNOSIS — J309 Allergic rhinitis, unspecified: Secondary | ICD-10-CM | POA: Diagnosis not present

## 2023-11-08 ENCOUNTER — Other Ambulatory Visit (HOSPITAL_BASED_OUTPATIENT_CLINIC_OR_DEPARTMENT_OTHER): Payer: Self-pay

## 2023-11-09 ENCOUNTER — Other Ambulatory Visit (HOSPITAL_BASED_OUTPATIENT_CLINIC_OR_DEPARTMENT_OTHER): Payer: Self-pay

## 2023-11-10 ENCOUNTER — Other Ambulatory Visit (HOSPITAL_BASED_OUTPATIENT_CLINIC_OR_DEPARTMENT_OTHER): Payer: Self-pay

## 2023-11-11 ENCOUNTER — Other Ambulatory Visit (HOSPITAL_BASED_OUTPATIENT_CLINIC_OR_DEPARTMENT_OTHER): Payer: Self-pay

## 2023-11-13 ENCOUNTER — Ambulatory Visit (INDEPENDENT_AMBULATORY_CARE_PROVIDER_SITE_OTHER): Payer: Self-pay

## 2023-11-13 DIAGNOSIS — J309 Allergic rhinitis, unspecified: Secondary | ICD-10-CM

## 2023-11-21 ENCOUNTER — Ambulatory Visit (INDEPENDENT_AMBULATORY_CARE_PROVIDER_SITE_OTHER): Payer: Self-pay | Admitting: *Deleted

## 2023-11-21 DIAGNOSIS — J309 Allergic rhinitis, unspecified: Secondary | ICD-10-CM | POA: Diagnosis not present

## 2023-11-22 ENCOUNTER — Other Ambulatory Visit: Payer: Self-pay

## 2023-11-22 ENCOUNTER — Other Ambulatory Visit (HOSPITAL_BASED_OUTPATIENT_CLINIC_OR_DEPARTMENT_OTHER): Payer: Self-pay

## 2023-11-27 ENCOUNTER — Ambulatory Visit (INDEPENDENT_AMBULATORY_CARE_PROVIDER_SITE_OTHER): Payer: Self-pay

## 2023-11-27 DIAGNOSIS — J309 Allergic rhinitis, unspecified: Secondary | ICD-10-CM

## 2023-11-29 ENCOUNTER — Other Ambulatory Visit (HOSPITAL_BASED_OUTPATIENT_CLINIC_OR_DEPARTMENT_OTHER): Payer: Self-pay

## 2023-11-29 MED ORDER — AMPHETAMINE-DEXTROAMPHET ER 30 MG PO CP24
30.0000 mg | ORAL_CAPSULE | Freq: Every day | ORAL | 0 refills | Status: AC
Start: 2023-11-29 — End: ?
  Filled 2024-05-07: qty 30, 30d supply, fill #0

## 2023-11-29 MED ORDER — LURASIDONE HCL 60 MG PO TABS
60.0000 mg | ORAL_TABLET | Freq: Every day | ORAL | 1 refills | Status: DC
  Filled 2023-11-29 – 2023-12-12 (×2): qty 30, 30d supply, fill #0
  Filled 2024-01-08: qty 30, 30d supply, fill #1

## 2023-11-29 MED ORDER — AMPHETAMINE-DEXTROAMPHET ER 30 MG PO CP24
30.0000 mg | ORAL_CAPSULE | Freq: Every day | ORAL | 0 refills | Status: AC
  Filled 2024-01-08: qty 30, 30d supply, fill #0

## 2023-11-30 ENCOUNTER — Other Ambulatory Visit: Payer: Self-pay

## 2023-11-30 ENCOUNTER — Ambulatory Visit (INDEPENDENT_AMBULATORY_CARE_PROVIDER_SITE_OTHER): Admitting: Family Medicine

## 2023-11-30 ENCOUNTER — Other Ambulatory Visit (HOSPITAL_BASED_OUTPATIENT_CLINIC_OR_DEPARTMENT_OTHER): Payer: Self-pay

## 2023-12-04 ENCOUNTER — Ambulatory Visit (INDEPENDENT_AMBULATORY_CARE_PROVIDER_SITE_OTHER): Payer: Self-pay

## 2023-12-04 DIAGNOSIS — J309 Allergic rhinitis, unspecified: Secondary | ICD-10-CM | POA: Diagnosis not present

## 2023-12-11 ENCOUNTER — Other Ambulatory Visit (HOSPITAL_BASED_OUTPATIENT_CLINIC_OR_DEPARTMENT_OTHER): Payer: Self-pay

## 2023-12-11 ENCOUNTER — Ambulatory Visit (INDEPENDENT_AMBULATORY_CARE_PROVIDER_SITE_OTHER): Payer: Self-pay

## 2023-12-11 DIAGNOSIS — J309 Allergic rhinitis, unspecified: Secondary | ICD-10-CM | POA: Diagnosis not present

## 2023-12-13 ENCOUNTER — Other Ambulatory Visit (HOSPITAL_BASED_OUTPATIENT_CLINIC_OR_DEPARTMENT_OTHER): Payer: Self-pay

## 2023-12-14 ENCOUNTER — Ambulatory Visit (INDEPENDENT_AMBULATORY_CARE_PROVIDER_SITE_OTHER): Admitting: Family Medicine

## 2023-12-20 ENCOUNTER — Ambulatory Visit (INDEPENDENT_AMBULATORY_CARE_PROVIDER_SITE_OTHER)

## 2023-12-20 DIAGNOSIS — J309 Allergic rhinitis, unspecified: Secondary | ICD-10-CM | POA: Diagnosis not present

## 2023-12-26 ENCOUNTER — Ambulatory Visit (INDEPENDENT_AMBULATORY_CARE_PROVIDER_SITE_OTHER): Admitting: Family Medicine

## 2023-12-26 ENCOUNTER — Encounter (INDEPENDENT_AMBULATORY_CARE_PROVIDER_SITE_OTHER): Payer: Self-pay | Admitting: Family Medicine

## 2023-12-26 VITALS — BP 106/62 | HR 68 | Temp 98.0°F | Ht 64.0 in | Wt 198.0 lb

## 2023-12-26 DIAGNOSIS — R0602 Shortness of breath: Secondary | ICD-10-CM

## 2023-12-26 DIAGNOSIS — J4520 Mild intermittent asthma, uncomplicated: Secondary | ICD-10-CM

## 2023-12-26 DIAGNOSIS — Z1331 Encounter for screening for depression: Secondary | ICD-10-CM

## 2023-12-26 DIAGNOSIS — E66812 Obesity, class 2: Secondary | ICD-10-CM

## 2023-12-26 DIAGNOSIS — Z6835 Body mass index (BMI) 35.0-35.9, adult: Secondary | ICD-10-CM

## 2023-12-26 DIAGNOSIS — E7800 Pure hypercholesterolemia, unspecified: Secondary | ICD-10-CM

## 2023-12-26 DIAGNOSIS — E559 Vitamin D deficiency, unspecified: Secondary | ICD-10-CM

## 2023-12-26 DIAGNOSIS — F5089 Other specified eating disorder: Secondary | ICD-10-CM | POA: Diagnosis not present

## 2023-12-26 DIAGNOSIS — R5383 Other fatigue: Secondary | ICD-10-CM

## 2023-12-26 DIAGNOSIS — F908 Attention-deficit hyperactivity disorder, other type: Secondary | ICD-10-CM

## 2023-12-26 NOTE — Progress Notes (Signed)
 Rae Bugler, D.O.  ABFM, ABOM Specializing in Clinical Bariatric Medicine Office located at: [address]  [place], [place]  [address]   Bariatric Medicine Visit  Dear Susanna Epley, FNP   Thank you for referring Megan Norris to our clinic today for evaluation.  We performed a consultation to discuss her options for treatment and educate the patient on her disease state.  The following note includes my evaluation and treatment recommendations.   Please do not hesitate to reach out to me directly if you have any further concerns.   Assessment and Plan:   Orders Placed This Encounter  Procedures   VITAMIN D  25 Hydroxy (Vit-D Deficiency, Fractures)   TSH   T4, free   T3   Lipid panel   Insulin , random   Hemoglobin A1c   Folate   Comprehensive metabolic panel with GFR   Vitamin B12   CBC with Differential/Platelet   EKG 12-Lead    Medications Discontinued During This Encounter  Medication Reason   lurasidone  (LATUDA ) 40 MG TABS tablet      FOR THE DISEASE OF OBESITY:  Class 2 severe obesity due to excess calories with serious comorbidity and body mass index (BMI) of 35.0 to 35.9 in adult [facility]) Assessment & Plan: Megan Norris is currently in the action stage of change. As such, her goal is to start our weight management plan.  She has agreed to implement: the Category 2 plan with extra Lunch Options   Behavioral Intervention We discussed the following Behavioral Modification Strategies today: continue to work on maintaining a reduced calorie state, getting the recommended amount of protein, incorporating whole foods, making healthy choices, staying well hydrated and practicing mindfulness when eating.  Additional resources provided today: Handout on Examples of Low Glycemic Index and Low Calorie Fruits & Vegetables, Handout on CAT 2 meal plan, and Handout on CAT 1-2 lunch options, Handout on non-starchy vegetables.   Evidence-based interventions for health behavior  change were utilized today including the discussion of self monitoring techniques, problem-solving barriers and SMART goal setting techniques.    Pt will specifically work on: measuring intake of veggies and lean proteins.    Recommended Physical Activity Goals Imberly has been advised to work up to 150 minutes of moderate intensity aerobic activity a week and strengthening exercises 2-3 times per week for cardiovascular health, weight loss maintenance and preservation of muscle mass.   She has agreed to : maintain current level of activity.    Pharmacotherapy We both agreed to : start with nutritional and behavioral strategies   ASSOCIATED CONDITIONS ADDRESSED TODAY:  Fatigue Assessment & Plan: Megan Norris does feel that her weight is causing her energy to be lower than it should be. Fatigue may be related to obesity, depression or many other causes. she does not appear to have any red flag symptoms and this appears to most likely be related to her current lifestyle habits and dietary intake.  Labs will be ordered and reviewed with her at their next office visit in two weeks.  Epworth sleepiness scale is 0. Megan Norris denies daytime somnolence and admits to waking up still tired. Megan Norris generally gets 6 hours of sleep per night, and states that she occasionally sleeps well. Snoring is not present. Apneic episodes are not present. Encouraged pt to aim for 7-9 hrs of sleep per night for the best weight loss results and for her overall health/well being.   ECG: Performed and reviewed/ interpreted independently. Sinus brady cardia, low voltage,  rate 59 bpm; reassuring without any acute abnormalities, will continue to monitor for symptoms    Shortness of breath on exertion Assessment & Plan: [place]on does feel that she gets out of breath more easily than she used to when she exercises and seems to be worsening over time with weight gain.  This has gotten worse recently. Megan Norris denies shortness of  breath at rest or orthopnea. Pt denies chest pain, dizziness, heart palpitations, or excessive diaphoresis or nausea with activity.  This is not new and is ongoing.  [place]'s shortness of breath appears to be obesity related and exercise induced, as they do not appear to have any "red flag" symptoms/ concerns today.  Also, this condition appears to be related to a state of poor cardiovascular conditioning   Obtain labs today and will be reviewed with her at their next office visit in two weeks.  Indirect Calorimeter completed today to help guide our dietary regimen. It shows a VO2 of 247 and a REE of 1699.  Her calculated basal metabolic rate is 1610 thus her resting energy expenditure is worse than expected.  Patient agreed to work on weight loss at this time.  As Megan Norris progresses through our weight loss program, we will gradually increase exercise as tolerated to treat her current condition.   If Megan Norris follows our recommendations and loses 5-10% of their weight without improvement of her shortness of breath or if at any time, symptoms become more concerning, they agree to urgently follow up with their PCP/ specialist for further consideration/ evaluation.   Megan Norris verbalizes agreement with this plan.    Other specified attention deficit hyperactivity disorder (ADHD) - emotional eating / Depression Screen Assessment & Plan: Most recent PHQ-9 score:    12/26/2023    9:01 AM 07/06/2023   10:28 AM 06/30/2022   11:08 AM 06/06/2022   11:11 AM 06/01/2021   11:04 AM  Depression screen PHQ 2/9  Decreased Interest 0 0 0 0 0  Down, Depressed, Hopeless 0 0 0 0 0  PHQ - 2 Score 0 0 0 0 0  Altered sleeping 0 0  0   Tired, decreased energy 1 0  0   Change in appetite 0 0  0   Feeling bad or failure about yourself  0 0  0   Trouble concentrating 1 0  0   Moving slowly or fidgety/restless 0 0  0   Suicidal thoughts 0 0  0   PHQ-9 Score 2 0  0   Difficult doing work/chores Somewhat difficult Not  difficult at all  Not difficult at all     Denies SI/HI. Denies h/o depression, however she does feel anxious and stressed quite often. Her outlets include exercising which somewhat helps. Mood is stable today.  Does not see a Veterinary surgeon. Has a psychiatrist who manages her Adderall. Pt feels the adderall causes her to not feel hungry. Emotional eating wise, she tends to eat when stressed and to comfort herself.   Patient was REFERRED to Dr. Delaine Favorite, our Bariatric Psychologist, for evaluation due to her  struggles with emotional eating. Pt advised to discuss with her psychiatrist about lowering her Adderal dose if it is truly not making her hungry. Reminded patient of the importance of beginning their prudent nutrition plan and how food can affect mood as well support emotional wellbeing.    Mild intermittent asthma without complication Assessment & Plan: Per hx. States her symptoms are well-managed with Singulair  and Ryaltris .  States she has  not needed to use her Albuterol . Continue regimen. Begin low inflammatory diet.    Elevated LDL cholesterol level Assessment & Plan: Most recent lipid panel: Lab Results  Component Value Date   CHOL 205 (H) 07/06/2023   HDL 72 07/06/2023   LDLCALC 121 (H) 07/06/2023   TRIG 65 07/06/2023   CHOLHDL 2.8 07/06/2023   States she has a strong fhx of high cholesterol. Her LDL is not at goal. Elevated LDL may be secondary to nutrition, genetics and spillover effect from excess adiposity. Begin diet plan low in saturated and trans fats. Losing 10% of body weight may improve LDL values.    Vitamin D  deficiency Assessment & Plan: Most recent vitamin D : Lab Results  Component Value Date   VD25OH 31.7 07/06/2023   VD25OH 33.1 06/06/2022   VD25OH 33.7 06/01/2021   Takes a multivitamin gummy a few times a week; does not know how much Vitamin D  it contains. Continue regimen. Will check levels and advise further at her next OV.   FOLLOW UP:   Follow up in  2 weeks. She was informed of the importance of frequent follow up visits to maximize her success with intensive lifestyle modifications for her multiple health conditions.  Megan Norris is aware that we will review all of her lab results at our next visit.  She is aware that if anything is critical/ life threatening with the results, we will be contacting her via MyChart prior to the office visit to discuss management.    Chief Complaint:   OBESITY GHALA STEGEN (MR# [id number]) is a pleasant 33 y.o. female who presents for evaluation and treatment of obesity and related comorbidities. Current BMI is Body mass index is 33.99 kg/m. Countess L Norris has been struggling with her weight for many years and has been unsuccessful in either losing weight, maintaining weight loss, or reaching her healthy weight goal.  Megan Norris is currently in the action stage of change and ready to dedicate time achieving and maintaining a healthier weight. Megan Norris is interested in becoming our patient and working on intensive lifestyle modifications including (but not limited to) diet and exercise for weight loss.  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 Patient is single  and lives with her 4 y.o daughter.   Heaviest weight was 211 lbs - started gaining wt in her mid to late 20s after starting her job.  Desires to be 165 lbs in 4 months b/c "I was happy at this weight".  Does cardio and weight training 60 minutes 3-5 times per week.   Has never tried a formal diet plan in the past.   Eats outside the home 2-3 times per week and has fast food or take out 2-3 times a week.   States she has no time to cook.   Craves salmon and fruit  Snacks on fruit especially after dinner.   Skips breakfast and lunch frequently.   Drinks Tequila (3 shots per month) and protein shakes.   Worst food habit: "not eating enough"  Subjective:   This is the patient's first visit at [facility].  The patient's NEW PATIENT PACKET that they filled out prior to today's office visit was reviewed at length and information from that paperwork was included within the following office visit note.    Included in the packet: current and past health history, medications, allergies, ROS, gynecologic history (women only), surgical history, family  history, social history, weight history, weight loss surgery history (for those that have had weight loss surgery), nutritional evaluation, mood and food questionnaire along with a depression screening (PHQ9) on all patients, an Epworth questionnaire, sleep habits questionnaire, patient life and health improvement goals questionnaire. These will all be scanned into the patient's chart under the "media" tab.   Review of Systems: Please refer to new patient packet scanned into media. Pertinent positives were addressed with patient today.  Reviewed by clinician on day of visit: allergies, medications, problem list, medical history, surgical history, family history, social history, and previous encounter notes.  During the visit, I independently reviewed the patient's EKG, bioimpedance scale results, and indirect calorimeter results. I used this information to tailor a meal plan for the patient that will help Megan Norris to lose weight and will improve her obesity-related conditions going forward.  I performed a medically necessary appropriate examination and/or evaluation. I discussed the assessment and treatment plan with the patient. The patient was provided an opportunity to ask questions and all were answered. The patient agreed with the plan and demonstrated an understanding of the instructions. Labs were ordered today (unless patient declined them) and will be reviewed with the patient at our next visit unless more critical results need to be addressed immediately. Clinical information was updated and documented in the EMR.   Objective:    PHYSICAL EXAM: Blood pressure 106/62, pulse 68, temperature 98 F (36.7 C), height 5\' 4"  (1.626 m), weight 198 lb (89.8 kg), SpO2 98%. Body mass index is 33.99 kg/m.  General: Well Developed, well nourished, and in no acute distress.  HEENT: Normocephalic, atraumatic; EOMI, sclerae are anicteric. Skin: Warm and dry, good turgor Chest:  Normal excursion, shape, no gross ABN Respiratory: No conversational dyspnea; speaking in full sentences NeuroM-Sk:  Normal gross ROM * 4 extremities  Psych: A and O *3, insight adequate, mood- full   Anthropometric Measurements Height: 5\' 4"  (1.626 m) Weight: 198 lb (89.8 kg) BMI (Calculated): 33.97 Weight at Last Visit: N/a Weight Lost Since Last Visit: N/a Weight Gained Since Last Visit: N/a Starting Weight: 198lb Total Weight Loss (lbs): 0 lb (0 kg) Peak Weight: 211lb Waist Measurement : 41 inches   Body Composition  Body Fat %: 37.6 % Fat Mass (lbs): 74.8 lbs Muscle Mass (lbs): 117.8 lbs Total Body Water (lbs): 79.4 lbs Visceral Fat Rating : 8   Other Clinical Data RMR: 1699 Fasting: Yes Labs: Yes Today's Visit #: 1 Starting Date: 12/26/23 Comments: First Visit   DIAGNOSTIC DATA REVIEWED:  BMET    Component Value Date/Time   NA 137 07/06/2023 1156   K 4.6 07/06/2023 1156   CL 98 07/06/2023 1156   CO2 25 07/06/2023 1156   GLUCOSE 72 07/06/2023 1156   GLUCOSE 93 03/04/2018 0616   BUN 22 (H) 07/06/2023 1156   CREATININE 0.93 07/06/2023 1156   CALCIUM  9.6 07/06/2023 1156   GFRNONAA >60 03/04/2018 0616   GFRAA >60 03/04/2018 0616   Lab Results  Component Value Date   HGBA1C 5.6 06/06/2022   HGBA1C 5.5 06/01/2021   No results found for: "INSULIN " No results found for: "TSH" CBC    Component Value Date/Time   WBC 5.4 07/06/2023 1156   WBC 7.1 03/04/2018 0616   RBC 4.33 07/06/2023 1156   RBC 4.24 03/04/2018 0616   HGB 13.5 07/06/2023 1156   HCT 40.4 07/06/2023 1156   PLT 241 07/06/2023 1156   MCV 93  07/06/2023 1156  MCH 31.2 07/06/2023 1156   MCH 30.9 03/04/2018 0616   MCHC 33.4 07/06/2023 1156   MCHC 33.9 03/04/2018 0616   RDW 13.4 07/06/2023 1156   Iron Studies No results found for: "IRON", "TIBC", "FERRITIN", "IRONPCTSAT" Lipid Panel     Component Value Date/Time   CHOL 205 (H) 07/06/2023 1156   TRIG 65 07/06/2023 1156   HDL 72 07/06/2023 1156   CHOLHDL 2.8 07/06/2023 1156   LDLCALC 121 (H) 07/06/2023 1156   Hepatic Function Panel     Component Value Date/Time   PROT 7.3 07/06/2023 1156   ALBUMIN 4.4 07/06/2023 1156   AST 18 07/06/2023 1156   ALT 12 07/06/2023 1156   ALKPHOS 43 (L) 07/06/2023 1156   BILITOT 0.3 07/06/2023 1156   No results found for: "TSH" Nutritional Lab Results  Component Value Date   VD25OH 31.7 07/06/2023   VD25OH 33.1 06/06/2022   VD25OH 33.7 06/01/2021    Attestation Statements:   I, Special Puri, acting as a Stage manager for Megan Sensor, DO., have compiled all relevant documentation for today's office visit on behalf of Megan Sensor, DO, while in the presence of [employer], DO.  I have spent 52 minutes in the care of the patient today including: 8 minutes before the visit reviewing and prepping the chart. 42 minutes face-to-face assessing and reviewing listed medical problems as outlined in obesity care plan, providing nutritional and behavioral counseling on topics outlined in the obesity care plan, independently interpreting test results and goals of care, as described in assessment and plan, reviewing and discussing biometric information and progress, and ordering diagnostics - see orders. 2 minutes after the visit updating chart and documentation   I have reviewed the above documentation for accuracy and completeness, and I agree with the above. Rae Bugler, D.O.  The 21st Century Cures Act was signed into law in 2016 which includes the topic of electronic health records.  This provides immediate access to  information in MyChart.  This includes consultation notes, operative notes, office notes, lab results and pathology reports.  If you have any questions about what you read please let us  know at your next visit so we can discuss your concerns and take corrective action if need be.  We are right here with you.

## 2023-12-27 LAB — COMPREHENSIVE METABOLIC PANEL WITH GFR
ALT: 11 IU/L (ref 0–32)
AST: 16 IU/L (ref 0–40)
Albumin: 4.4 g/dL (ref 3.9–4.9)
Alkaline Phosphatase: 57 IU/L (ref 44–121)
BUN/Creatinine Ratio: 16 (ref 9–23)
BUN: 13 mg/dL (ref 6–20)
Bilirubin Total: 0.7 mg/dL (ref 0.0–1.2)
CO2: 22 mmol/L (ref 20–29)
Calcium: 9.4 mg/dL (ref 8.7–10.2)
Chloride: 101 mmol/L (ref 96–106)
Creatinine, Ser: 0.81 mg/dL (ref 0.57–1.00)
Globulin, Total: 3.2 g/dL (ref 1.5–4.5)
Glucose: 79 mg/dL (ref 70–99)
Potassium: 4.1 mmol/L (ref 3.5–5.2)
Sodium: 138 mmol/L (ref 134–144)
Total Protein: 7.6 g/dL (ref 6.0–8.5)
eGFR: 98 mL/min/{1.73_m2} (ref >59)

## 2023-12-27 LAB — TSH: TSH: 0.977 u[IU]/mL (ref 0.450–4.500)

## 2023-12-27 LAB — HEMOGLOBIN A1C
Est. average glucose Bld gHb Est-mCnc: 114 mg/dL
Hgb A1c MFr Bld: 5.6 % (ref 4.8–5.6)

## 2023-12-27 LAB — CBC WITH DIFFERENTIAL/PLATELET
Basophils Absolute: 0 10*3/uL (ref 0.0–0.2)
Basos: 0 %
EOS (ABSOLUTE): 0.1 10*3/uL (ref 0.0–0.4)
Eos: 1 %
Hematocrit: 39.5 % (ref 34.0–46.6)
Hemoglobin: 13.5 g/dL (ref 11.1–15.9)
Immature Grans (Abs): 0 10*3/uL (ref 0.0–0.1)
Immature Granulocytes: 0 %
Lymphocytes Absolute: 1.7 10*3/uL (ref 0.7–3.1)
Lymphs: 24 %
MCH: 31.3 pg (ref 26.6–33.0)
MCHC: 34.2 g/dL (ref 31.5–35.7)
MCV: 92 fL (ref 79–97)
Monocytes Absolute: 0.4 10*3/uL (ref 0.1–0.9)
Monocytes: 5 %
Neutrophils Absolute: 4.9 10*3/uL (ref 1.4–7.0)
Neutrophils: 70 %
Platelets: 255 10*3/uL (ref 150–450)
RBC: 4.31 x10E6/uL (ref 3.77–5.28)
RDW: 12.6 % (ref 11.7–15.4)
WBC: 7 10*3/uL (ref 3.4–10.8)

## 2023-12-27 LAB — LIPID PANEL
Chol/HDL Ratio: 3.4 ratio (ref 0.0–4.4)
Cholesterol, Total: 202 mg/dL — ABNORMAL HIGH (ref 100–199)
HDL: 60 mg/dL (ref >39)
LDL Chol Calc (NIH): 123 mg/dL — ABNORMAL HIGH (ref 0–99)
Triglycerides: 106 mg/dL (ref 0–149)
VLDL Cholesterol Cal: 19 mg/dL (ref 5–40)

## 2023-12-27 LAB — T3: T3, Total: 86 ng/dL (ref 71–180)

## 2023-12-27 LAB — VITAMIN D 25 HYDROXY (VIT D DEFICIENCY, FRACTURES): Vit D, 25-Hydroxy: 39.9 ng/mL (ref 30.0–100.0)

## 2023-12-27 LAB — FOLATE: Folate: 18.4 ng/mL (ref >3.0)

## 2023-12-27 LAB — INSULIN, RANDOM: INSULIN: 8.7 u[IU]/mL (ref 2.6–24.9)

## 2023-12-27 LAB — T4, FREE: Free T4: 1.23 ng/dL (ref 0.82–1.77)

## 2023-12-27 LAB — VITAMIN B12: Vitamin B-12: 693 pg/mL (ref 232–1245)

## 2023-12-28 ENCOUNTER — Ambulatory Visit (INDEPENDENT_AMBULATORY_CARE_PROVIDER_SITE_OTHER): Payer: Self-pay

## 2023-12-28 DIAGNOSIS — J309 Allergic rhinitis, unspecified: Secondary | ICD-10-CM

## 2024-01-03 ENCOUNTER — Ambulatory Visit (INDEPENDENT_AMBULATORY_CARE_PROVIDER_SITE_OTHER)

## 2024-01-03 DIAGNOSIS — J309 Allergic rhinitis, unspecified: Secondary | ICD-10-CM | POA: Diagnosis not present

## 2024-01-08 ENCOUNTER — Other Ambulatory Visit (HOSPITAL_BASED_OUTPATIENT_CLINIC_OR_DEPARTMENT_OTHER): Payer: Self-pay

## 2024-01-09 ENCOUNTER — Encounter (INDEPENDENT_AMBULATORY_CARE_PROVIDER_SITE_OTHER): Payer: Self-pay | Admitting: Family Medicine

## 2024-01-09 ENCOUNTER — Ambulatory Visit (INDEPENDENT_AMBULATORY_CARE_PROVIDER_SITE_OTHER): Admitting: Family Medicine

## 2024-01-09 ENCOUNTER — Other Ambulatory Visit (HOSPITAL_BASED_OUTPATIENT_CLINIC_OR_DEPARTMENT_OTHER): Payer: Self-pay

## 2024-01-09 VITALS — BP 105/73 | HR 72 | Temp 98.7°F | Ht 64.0 in | Wt 195.0 lb

## 2024-01-09 DIAGNOSIS — E7800 Pure hypercholesterolemia, unspecified: Secondary | ICD-10-CM

## 2024-01-09 DIAGNOSIS — Z6835 Body mass index (BMI) 35.0-35.9, adult: Secondary | ICD-10-CM

## 2024-01-09 DIAGNOSIS — E66812 Obesity, class 2: Secondary | ICD-10-CM

## 2024-01-09 DIAGNOSIS — E559 Vitamin D deficiency, unspecified: Secondary | ICD-10-CM | POA: Diagnosis not present

## 2024-01-09 DIAGNOSIS — E88819 Insulin resistance, unspecified: Secondary | ICD-10-CM

## 2024-01-09 MED ORDER — VITAMIN D (ERGOCALCIFEROL) 1.25 MG (50000 UNIT) PO CAPS
50000.0000 [IU] | ORAL_CAPSULE | ORAL | 1 refills | Status: DC
  Filled 2024-01-09: qty 4, 28d supply, fill #0

## 2024-01-09 NOTE — Progress Notes (Signed)
 Megan Norris, D.O.  ABFM, ABOM Clinical Bariatric Medicine Physician  Office located at: [address] [place], [place]  [address]   Assessment and Plan:   Medications Discontinued During This Encounter  Medication Reason   Ergocalciferol  (VITAMIN D2) 50 MCG (2000 UT) TABS      Meds ordered this encounter  Medications   Vitamin D , Ergocalciferol , (DRISDOL ) 1.25 MG (50000 UNIT) CAPS capsule    Sig: Take 1 capsule (50,000 Units total) by mouth every 7 (seven) days.    Dispense:  4 capsule    Refill:  1    FOR THE DISEASE OF OBESITY:  Class 2 severe obesity due to excess calories with serious comorbidity and body mass index (BMI) of 35.0 to 35.9 in adult [facility]) Assessment & Plan: Since last office visit on 12/26/2023 patient's  Muscle mass has increased by 1 lb. Fat mass has decreased by 4.2 lb. Total body water has decreased by 0.6 lb.  Counseling done on how various foods will affect these numbers and how to maximize success  Total lbs lost to date: - 3 lb Total weight loss percentage to date: 1.52%    Recommended Dietary Goals [name] is currently in the action stage of change. As such, her goal is to continue weight management plan.  She has agreed to: continue current plan   Behavioral Intervention We discussed the following today: increasing lean protein intake to established goals, increasing lower glycemic fruits, avoiding skipping meals, increasing water intake , and continue to work on implementation of reduced calorie nutritional plan  Additional resources provided today: Handout on Examples of Low Glycemic Index and Low Calorie Fruits & Vegetables, Handout on increasing daily activity and exercise goal setting, Handout on CAT 2 meal plan, Handout on insulin  resistance education, and Handout on Common Characteristics of Successful Weight Losers, Handout on Healthy Chicken Salad Recipe.   Evidence-based interventions for health behavior change were utilized  today including the discussion of self monitoring techniques, problem-solving barriers and SMART goal setting techniques.  Regarding patient's less desirable eating habits and patterns, we employed the technique of small changes.   Pt will specifically work on: n/a   Recommended Physical Activity Goals Modesty has been advised to work up to 150 minutes of moderate intensity aerobic activity a week and strengthening exercises 2-3 times per week for cardiovascular health, weight loss maintenance and preservation of muscle mass.   She has agreed to : continue to gradually increase the amount and intensity of exercise routine   Pharmacotherapy We both agreed to continue with nutritional and behavioral strategies   ASSOCIATED CONDITIONS ADDRESSED TODAY:  Insulin  resistance - new onset Assessment & Plan: Most recent labs: Lab Results  Component Value Date   HGBA1C 5.6 12/26/2023   HGBA1C 5.6 06/06/2022   HGBA1C 5.5 06/01/2021   INSULIN  8.7 12/26/2023   Lab Results  Component Value Date   CREATININE 0.81 12/26/2023   BUN 13 12/26/2023   NA 138 12/26/2023   K 4.1 12/26/2023   CL 101 12/26/2023   CO2 22 12/26/2023      Component Value Date/Time   PROT 7.6 12/26/2023 1039   ALBUMIN 4.4 12/26/2023 1039   AST 16 12/26/2023 1039   ALT 11 12/26/2023 1039   ALKPHOS 57 12/26/2023 1039   BILITOT 0.7 12/26/2023 1039   Lab Results  Component Value Date   WBC 7.0 12/26/2023   HGB 13.5 12/26/2023   HCT 39.5 12/26/2023   MCV 92 12/26/2023  PLT 255 12/26/2023   Lab Results  Component Value Date   VITAMINB12 693 12/26/2023   FOLATE 18.4 12/26/2023   Lab Results  Component Value Date   TSH 0.977 12/26/2023   FREET4 1.23 12/26/2023   T3TOTAL 86 12/26/2023    Her fasting insulin  is almost 2x normal. Other labs: A1c, kidney function, liver enzymes, blood counts, B12, folate, and thyroid  levels are within acceptable ranges.   Patient aware of disease state and risk of  progression. Explained role of simple carbs and insulin  levels on hunger and cravings. Educated patient that having adequate amounts of protein with each meal is important for increasing muscle mass, stabilizing sugars, controlling hunger and cravings, and improving thermogenesis.   Continue balanced diet focusing on protein, fruits, and vegetables while limiting simple carbohydrates. Losing 5-10% or more of body weight may improve condition.   Elevated LDL cholesterol level Assessment & Plan: Most recent lab: Lab Results  Component Value Date   CHOL 202 (H) 12/26/2023   HDL 60 12/26/2023   LDLCALC 123 (H) 12/26/2023   TRIG 106 12/26/2023   CHOLHDL 3.4 12/26/2023   Diet/lifestyle approach. Lipid panel WNL w/exception of LDL. Recommended LDL is <100. Limit red meat intake to no more than two days a week. Pt advised to reduce saturated and trans fats in diet. Losing 10% of body weight may improve LDL values.    Vitamin D  deficiency Assessment & Plan: Most recent VD: Lab Results  Component Value Date   VD25OH 39.9 12/26/2023   VD25OH 31.7 07/06/2023   VD25OH 33.1 06/06/2022   Is currently on OTC VD 2,000 units daily.   Her Vitamin D  levels are not at goal of 50 to 70. Deficiency state associated with adiposity and may result in leptin resistance, weight gain and fatigue. Shared decision making: Pt will STOP OTC Vitamin D  and INITIATE ergocalciferol  50,000 units wkly. Recheck in 3-4 months.   FOLLOW UP:   Return 02/21/2024 at 8:20 AM. She was informed of the importance of frequent follow up visits to maximize her success with intensive lifestyle modifications for her multiple health conditions.  Subjective:   Chief complaint: Obesity Megan Norris is here to discuss her progress with her obesity treatment plan. She is on the Category 2 Plan with [facility] and states she is following her eating plan approximately 75% of the time. She states she is doing weight lifting and cardio  60  minutes 3-4 days per week.  Interval History:  Megan Norris is here today for her first follow-up office visit since starting the program with us . Patient is off to a good start and has lost 3 lbs. Typically skips 1 meal per day. Weighed her lean proteins but not her veggies. Gets in 8 ounces of protein at dinner. Used her snack calories on fruit or 10 gram protein snack packs. Sometimes hungry after working out; overall hunger and cravings are controlled. Is seeing Dr.Barker in early June.   Pharmacotherapy for weight loss: none  Review of Systems:  Pertinent positives were addressed with patient today.   Reviewed by clinician on day of visit: allergies, medications, problem list, medical history, surgical history, family history, social history, and previous encounter notes.  Weight Summary and Biometrics   Weight Lost Since Last Visit: 3lb  Weight Gained Since Last Visit: 0lb   Vitals Temp: 98.7 F (37.1 C) BP: 105/73 Pulse Rate: 72 SpO2: 99 %   Anthropometric Measurements Height: 5\' 4"  (1.626 m) Weight: 195 lb (88.5  kg) BMI (Calculated): 33.46 Weight at Last Visit: 198lb Weight Lost Since Last Visit: 3lb Weight Gained Since Last Visit: 0lb Starting Weight: 198lb Total Weight Loss (lbs): 3 lb (1.361 kg) Peak Weight: 211lb Waist Measurement : 41 inches   Body Composition  Body Fat %: 36.1 % Fat Mass (lbs): 70.6 lbs Muscle Mass (lbs): 118.8 lbs Total Body Water (lbs): 78.8 lbs Visceral Fat Rating : 7   Other Clinical Data RMR: 169 Fasting: No Labs: No Today's Visit #: 2 Starting Date: 12/26/23 Comments: Second Visit   Objective:   PHYSICAL EXAM:  Blood pressure 105/73, pulse 72, temperature 98.7 F (37.1 C), height 5\' 4"  (1.626 m), weight 195 lb (88.5 kg), SpO2 99%. Body mass index is 33.47 kg/m.  General: she is overweight, cooperative and in no acute distress.   HEENT: EOMI, sclerae are anicteric. Lungs: Normal breathing effort, no  conversational dyspnea. M-Sk:  Normal gross ROM * 4 extremities  PSYCH: Has normal mood, affect and thought process. Neurologic: No gross sensory or motor deficits. Well developed, A and O * 3  DIAGNOSTIC DATA REVIEWED:  BMET    Component Value Date/Time   NA 138 12/26/2023 1039   K 4.1 12/26/2023 1039   CL 101 12/26/2023 1039   CO2 22 12/26/2023 1039   GLUCOSE 79 12/26/2023 1039   GLUCOSE 93 03/04/2018 0616   BUN 13 12/26/2023 1039   CREATININE 0.81 12/26/2023 1039   CALCIUM  9.4 12/26/2023 1039   GFRNONAA >60 03/04/2018 0616   GFRAA >60 03/04/2018 0616   Lab Results  Component Value Date   HGBA1C 5.6 12/26/2023   HGBA1C 5.5 06/01/2021   Lab Results  Component Value Date   INSULIN  8.7 12/26/2023   Lab Results  Component Value Date   TSH 0.977 12/26/2023   CBC    Component Value Date/Time   WBC 7.0 12/26/2023 1039   WBC 7.1 03/04/2018 0616   RBC 4.31 12/26/2023 1039   RBC 4.24 03/04/2018 0616   HGB 13.5 12/26/2023 1039   HCT 39.5 12/26/2023 1039   PLT 255 12/26/2023 1039   MCV 92 12/26/2023 1039   MCH 31.3 12/26/2023 1039   MCH 30.9 03/04/2018 0616   MCHC 34.2 12/26/2023 1039   MCHC 33.9 03/04/2018 0616   RDW 12.6 12/26/2023 1039   Iron Studies No results found for: "IRON", "TIBC", "FERRITIN", "IRONPCTSAT" Lipid Panel     Component Value Date/Time   CHOL 202 (H) 12/26/2023 1039   TRIG 106 12/26/2023 1039   HDL 60 12/26/2023 1039   CHOLHDL 3.4 12/26/2023 1039   LDLCALC 123 (H) 12/26/2023 1039   Hepatic Function Panel     Component Value Date/Time   PROT 7.6 12/26/2023 1039   ALBUMIN 4.4 12/26/2023 1039   AST 16 12/26/2023 1039   ALT 11 12/26/2023 1039   ALKPHOS 57 12/26/2023 1039   BILITOT 0.7 12/26/2023 1039      Component Value Date/Time   TSH 0.977 12/26/2023 1039   Nutritional Lab Results  Component Value Date   VD25OH 39.9 12/26/2023   VD25OH 31.7 07/06/2023   VD25OH 33.1 06/06/2022    Attestations:   I, Special Puri, acting as  a Stage manager for [employer], DO., have compiled all relevant documentation for today's office visit on behalf of Marceil Sensor, DO, while in the presence of [employer], DO.  I have spent 43 minutes in the care of the patient today.  36 minutes was spent on face-to-face counseling and reviewing listed medical  problems above as outlined in office visit note, providing nutritional and behavioral counseling as outlined in obesity care plan, independently interpreting results and goals of care, see listed medical problems, and discussing biometric information and progress. We reviewed her meal plan and discussed how the foods she's eating is affecting each one of her labs. Pt educated on why we want her to eat various foods in various amounts and has a better understanding of the nutritional plan because of this. All her questions were answered today. 7 minutes was spent on pre-chart review and additional documentation after the visit.   I have reviewed the above documentation for accuracy and completeness, and I agree with the above. Megan Norris, D.O.  The 21st Century Cures Act was signed into law in 2016 which includes the topic of electronic health records.  This provides immediate access to information in MyChart.  This includes consultation notes, operative notes, office notes, lab results and pathology reports.  If you have any questions about what you read please let us  know at your next visit so we can discuss your concerns and take corrective action if need be.  We are right here with you.

## 2024-01-11 ENCOUNTER — Ambulatory Visit (INDEPENDENT_AMBULATORY_CARE_PROVIDER_SITE_OTHER)

## 2024-01-11 DIAGNOSIS — J309 Allergic rhinitis, unspecified: Secondary | ICD-10-CM | POA: Diagnosis not present

## 2024-01-23 ENCOUNTER — Telehealth (INDEPENDENT_AMBULATORY_CARE_PROVIDER_SITE_OTHER): Admitting: Psychology

## 2024-01-23 DIAGNOSIS — F5089 Other specified eating disorder: Secondary | ICD-10-CM | POA: Diagnosis not present

## 2024-01-23 DIAGNOSIS — F909 Attention-deficit hyperactivity disorder, unspecified type: Secondary | ICD-10-CM | POA: Diagnosis not present
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
## 2024-01-24 ENCOUNTER — Other Ambulatory Visit (HOSPITAL_BASED_OUTPATIENT_CLINIC_OR_DEPARTMENT_OTHER): Payer: Self-pay

## 2024-01-24 MED ORDER — AMPHETAMINE-DEXTROAMPHET ER 30 MG PO CP24: 30.0000 mg | ORAL_CAPSULE | Freq: Every day | ORAL | 0 refills | Status: AC

## 2024-01-24 MED ORDER — LURASIDONE HCL 60 MG PO TABS: 60.0000 mg | ORAL_TABLET | Freq: Every day | ORAL | 0 refills | Status: DC

## 2024-01-24 MED ORDER — AMPHETAMINE-DEXTROAMPHETAMINE 10 MG PO TABS
10.0000 mg | ORAL_TABLET | Freq: Every day | ORAL | 0 refills | Status: DC
  Filled 2024-01-24: qty 30, 30d supply, fill #0

## 2024-01-25 ENCOUNTER — Encounter: Payer: Self-pay | Admitting: Allergy

## 2024-01-25 ENCOUNTER — Ambulatory Visit (INDEPENDENT_AMBULATORY_CARE_PROVIDER_SITE_OTHER): Payer: Medicaid Other | Admitting: Allergy

## 2024-01-25 ENCOUNTER — Ambulatory Visit: Payer: Self-pay

## 2024-01-25 ENCOUNTER — Other Ambulatory Visit (HOSPITAL_BASED_OUTPATIENT_CLINIC_OR_DEPARTMENT_OTHER): Payer: Self-pay

## 2024-01-25 ENCOUNTER — Other Ambulatory Visit: Payer: Self-pay

## 2024-01-25 VITALS — BP 116/68 | HR 63 | Temp 98.0°F | Resp 14 | Ht 63.78 in | Wt 195.8 lb

## 2024-01-25 DIAGNOSIS — J4520 Mild intermittent asthma, uncomplicated: Secondary | ICD-10-CM

## 2024-01-25 DIAGNOSIS — J302 Other seasonal allergic rhinitis: Secondary | ICD-10-CM

## 2024-01-25 DIAGNOSIS — L2089 Other atopic dermatitis: Secondary | ICD-10-CM | POA: Diagnosis not present

## 2024-01-25 DIAGNOSIS — J3089 Other allergic rhinitis: Secondary | ICD-10-CM

## 2024-01-25 DIAGNOSIS — J309 Allergic rhinitis, unspecified: Secondary | ICD-10-CM

## 2024-01-25 MED ORDER — LEVOCETIRIZINE DIHYDROCHLORIDE 5 MG PO TABS
5.0000 mg | ORAL_TABLET | Freq: Every day | ORAL | 1 refills | Status: AC | PRN
  Filled 2024-01-25 – 2024-02-16 (×2): qty 30, 30d supply, fill #0
  Filled 2024-03-18: qty 30, 30d supply, fill #1
  Filled 2024-05-07: qty 30, 30d supply, fill #2
  Filled 2024-06-13: qty 30, 30d supply, fill #3
  Filled 2024-07-18: qty 30, 30d supply, fill #4

## 2024-01-25 MED ORDER — TRIAMCINOLONE ACETONIDE 0.025 % EX OINT
1.0000 | TOPICAL_OINTMENT | Freq: Two times a day (BID) | CUTANEOUS | 1 refills | Status: AC | PRN
  Filled 2024-01-25 – 2024-02-16 (×2): qty 454, 28d supply, fill #0

## 2024-01-25 NOTE — Progress Notes (Signed)
 Follow-up Note  RE: Megan Norris MRN: [id number] DOB: September 18, 1990 Date of Office Visit: 01/25/2024   History of present illness: Megan Norris is a 32 y.o. female presenting today for follow-up of allergic rhinitis, reactive airway and eczema.  She was last seen in the office on 08/28/2023 by Dr. Cornel Diesel for Lamonte Pimenta immunotherapy start.   Discussed the use of AI scribe software for clinical note transcription with the patient, who gave verbal consent to proceed.  She is currently undergoing allergy  immunotherapy and has been receiving allergy  shots since January. She is doing well with the treatment and is close to reaching the maintenance phase. Today, she received a dose of 0.4 mL, with the maintenance dose being 0.5 mL.  Her allergy  symptoms are slightly better controlled since starting the shots. She did not take her allergy  medicine today, which she usually does, and sometimes experiences larger bumps at the injection site if she misses a dose of her allergy  medication.  She is currently taking Xyzal  daily for her allergies and finds it helpful. She also uses Dymista  nasal spray on days when she feels stuffy, which she finds effective. The nasal spray is used twice a day as needed.   She has not needed to use her rescue inhaler recently.  She would like to have a refill of triamcinolone for as needed use in case her eczema flares.  Review of systems: 10pt ROS negative unless noted above in HPI  Past medical/social/surgical/family history have been reviewed and are unchanged unless specifically indicated below.  No changes  Medication List: Current Outpatient Medications  Medication Sig Dispense Refill   albuterol  (VENTOLIN  HFA) 108 (90 Base) MCG/ACT inhaler Inhale 2 puffs into the lungs every 4 (four) hours as needed for wheezing. 8.5 g 1   amphetamine -dextroamphetamine  (ADDERALL XR) 30 MG 24 hr capsule Take 1 capsule (30 mg total) by mouth daily. 30 capsule 0    amphetamine -dextroamphetamine  (ADDERALL XR) 30 MG 24 hr capsule Take 1 capsule (30 mg total) by mouth daily. 30 capsule 0   amphetamine -dextroamphetamine  (ADDERALL XR) 30 MG 24 hr capsule Take 1 capsule (30 mg total) by mouth daily. (12-27-23) 30 capsule 0   amphetamine -dextroamphetamine  (ADDERALL XR) 30 MG 24 hr capsule Take 1 capsule (30 mg total) by mouth daily. 30 capsule 0   amphetamine -dextroamphetamine  (ADDERALL XR) 30 MG 24 hr capsule Take 1 capsule (30 mg total) by mouth daily. 30 capsule 0   amphetamine -dextroamphetamine  (ADDERALL) 10 MG tablet Take 1 tablet by mouth daily at 2 PM 30 tablet 0   Azelastine -Fluticasone  (DYMISTA ) 137-50 MCG/ACT SUSP Place 1 spray into both nostrils in the morning and at bedtime. 23 g 5   EPINEPHrine  0.3 mg/0.3 mL IJ SOAJ injection Inject 0.3 mg into the muscle as needed for anaphylaxis. 2 each 1   fluticasone  (FLONASE ALLERGY  RELIEF) 50 MCG/ACT nasal spray Place 2 sprays into both nostrils daily.     ibuprofen  (ADVIL ) 800 MG tablet Take 800 mg by mouth 3 (three) times daily.     levonorgestrel (MIRENA) 20 MCG/DAY IUD 1 each by Intrauterine route once.     Lurasidone  HCl 60 MG TABS Take 1 tablet (60 mg total) by mouth at bedtime. 90 tablet 0   montelukast  (SINGULAIR ) 10 MG tablet Take 1 tablet (10 mg total) by mouth at bedtime. 90 tablet 2   Multiple Vitamins-Minerals (HAIR VITAMINS PO) Take by mouth.     Olopatadine -Mometasone  (RYALTRIS ) 665-25 MCG/ACT SUSP Place 2 sprays into the nose 2 (  two) times daily as needed. 29 g 5   Probiotic Product (PROBIOTIC ADVANCED PO) Take by mouth.     scopolamine  (TRANSDERM-SCOP) 1 MG/3DAYS Place 1 patch (1.5 mg total) onto the skin every 3 (three) days. 4 patch 0   sodium fluoride  (LURIDE) 1.1 (0.5 F) MG/ML SOLN Take 1 drop by mouth.     SODIUM FLUORIDE , DENTAL RINSE, (PREVIDENT) 0.2 % SOLN USE AS AN ORAL RINSE, AS DIRECTED ON PACKAGE 473 mL 5   triamcinolone (KENALOG) 0.025 % ointment Apply to the affected area(s) topically  twice daily as needed. 454 g 1   valACYclovir  (VALTREX ) 1000 MG tablet Take 1 tablet (1,000 mg total) by mouth every 12 (twelve) hours. 14 tablet 0   valACYclovir  (VALTREX ) 1000 MG tablet Take 1 tablet (1,000 mg total) by mouth daily. 90 tablet 3   Vitamin D , Ergocalciferol , (DRISDOL ) 1.25 MG (50000 UNIT) CAPS capsule Take 1 capsule (50,000 Units total) by mouth every 7 (seven) days. 4 capsule 1   levocetirizine (XYZAL ) 5 MG tablet Take 1 tablet (5 mg total) by mouth daily as needed for allergies (Can take an extra dose during flare ups.). 180 tablet 1   No current facility-administered medications for this visit.     Known medication allergies: Allergies  Allergen Reactions   Bee Venom Hives and Itching   Sulfa Antibiotics Other (See Comments), Swelling and Nausea And Vomiting    Swelling from sulfa eye drops as a child     Physical examination: Blood pressure 116/68, pulse 63, temperature 98 F (36.7 C), temperature source Temporal, resp. rate 14, height 5' 3.78" (1.62 m), weight 195 lb 12.8 oz (88.8 kg), SpO2 98%.  General: Alert, interactive, in no acute distress. HEENT: PERRLA, TMs pearly gray, turbinates non-edematous without discharge, post-pharynx non erythematous. Neck: Supple without lymphadenopathy. Lungs: Clear to auscultation without wheezing, rhonchi or rales. {no increased work of breathing. CV: Normal S1, S2 without murmurs. Abdomen: Nondistended, nontender. Skin: Warm and dry, without lesions or rashes. Extremities:  No clubbing, cyanosis or edema. Neuro:   Grossly intact.  Diagnostics/Labs: Allergen injections given in each arm today  Assessment and plan: Allergic rhinitis  - Continue avoidance measures for grasses, weeds, trees, outdoor molds, and dust mites. - Continue Xyzal  (levocetirizine) 5mg  tablet once daily.  May take additional dose if needed. - Continue Singulair  (montelukast ) 10mg  daily. - Continue Dymista  1 spray each nostril twice a day as needed  for runny or stuffy nose.   With using nasal sprays point tip of bottle toward eye on same side nostril and lean head slightly forward for best technique.   - Recommend nasal saline rinses 3-7 times a week to remove allergens from the nasal cavities as well as help with mucous clearance (this is especially helpful to do before the nasal sprays are given) - Continue allergy  shots per schedule.  You are in the maintenance red vial! Have access to epipen  on your allergy  shot days.  Take your Xyzal  on allergy  shot days.   Reactive airway, mild intermittent - Have access to albuterol  inhaler 2 puffs every 4-6 hours as needed for cough/wheeze/shortness of breath/chest tightness.  May use 15-20 minutes prior to activity.   Monitor frequency of use.    Eczema - Use Triamcinolone 0.1% ointment twice a day as needed for eczema flares.   Follow-up in 6 months or sooner if needed  I appreciate the opportunity to take part in Rubbie's care. Please do not hesitate to contact me with questions.  Sincerely,   Catha Clink, MD Allergy /Immunology [facility] 

## 2024-01-25 NOTE — Patient Instructions (Addendum)
-   Continue avoidance measures for grasses, weeds, trees, outdoor molds, and dust mites. - Continue Xyzal  (levocetirizine) 5mg  tablet once daily.  May take additional dose if needed. - Continue Singulair  (montelukast ) 10mg  daily. - Continue Dymista  1 spray each nostril twice a day as needed for runny or stuffy nose.   With using nasal sprays point tip of bottle toward eye on same side nostril and lean head slightly forward for best technique.   - Recommend nasal saline rinses 3-7 times a week to remove allergens from the nasal cavities as well as help with mucous clearance (this is especially helpful to do before the nasal sprays are given) - Continue allergy  shots per schedule.  You are in the maintenance red vial! Have access to epipen  on your allergy  shot days.  Take your Xyzal  on allergy  shot days.   - Have access to albuterol  inhaler 2 puffs every 4-6 hours as needed for cough/wheeze/shortness of breath/chest tightness.  May use 15-20 minutes prior to activity.   Monitor frequency of use.    - Use Triamcinolone 0.1% ointment twice a day as needed for eczema flares.   Follow-up in 6 months or sooner if needed

## 2024-01-26 ENCOUNTER — Other Ambulatory Visit (HOSPITAL_BASED_OUTPATIENT_CLINIC_OR_DEPARTMENT_OTHER): Payer: Self-pay

## 2024-01-29 ENCOUNTER — Ambulatory Visit (INDEPENDENT_AMBULATORY_CARE_PROVIDER_SITE_OTHER): Payer: Self-pay

## 2024-01-29 DIAGNOSIS — J309 Allergic rhinitis, unspecified: Secondary | ICD-10-CM | POA: Diagnosis not present

## 2024-02-05 ENCOUNTER — Other Ambulatory Visit (HOSPITAL_BASED_OUTPATIENT_CLINIC_OR_DEPARTMENT_OTHER): Payer: Self-pay

## 2024-02-12 ENCOUNTER — Telehealth (INDEPENDENT_AMBULATORY_CARE_PROVIDER_SITE_OTHER): Admitting: Psychology

## 2024-02-12 DIAGNOSIS — F909 Attention-deficit hyperactivity disorder, unspecified type: Secondary | ICD-10-CM | POA: Diagnosis not present

## 2024-02-12 DIAGNOSIS — J301 Allergic rhinitis due to pollen: Secondary | ICD-10-CM | POA: Diagnosis not present

## 2024-02-12 DIAGNOSIS — F5089 Other specified eating disorder: Secondary | ICD-10-CM

## 2024-02-12 NOTE — Progress Notes (Signed)
 Office: [phone]  /  Fax: [phone]    Date: February 12, 2024  Appointment Start Time: 12:30pm Duration: 25 minutes Provider: Wyatt Fire, Psy.D. Type of Session: Individual Therapy  Location of Patient: Work (address obtained; safe & private location) Location of Provider: Provider's Home (private office) Type of Contact: Telepsychological Visit via MyChart Video Visit  Session Content: Megan Norris is a 33 y.o. female presenting for a follow-up appointment to address the previously established treatment goal of increasing coping skills.Today's appointment was a telepsychological visit. Megan Norris provided verbal consent for today's telepsychological appointment and she is aware she is responsible for securing confidentiality on her end of the session. Prior to proceeding with today's appointment, Megan Norris's physical location at the time of this appointment was obtained as well a phone number she could be reached at in the event of technical difficulties. Megan Norris and this provider participated in today's telepsychological service.   This provider conducted a brief check-in. Megan Norris stated she did not receive the previously shared e-mail and requested the e-mail be re-sent. She also shared about her recent appointment with her psychiatric provider, noting she is diagnosed with bipolar disorder. Megan Norris further shared about ongoing frustration/irritability at home which she believes is secondary to her trauma history. She acknowledged she never shared the aforementioned with her psychiatric provider, but noted a plan to do so during their next appointment. This provider recommended longer-term therapeutic services to process past trauma and current sxs. Reviewed emotional and physical hunger characteristics. Psychoeducation regarding triggers for emotional eating was provided. Megan Norris was provided a handout, and encouraged to utilize the handout between now and the next appointment to increase awareness  of triggers and frequency. Megan Norris agreed. This provider also discussed behavioral strategies for specific triggers, such as placing the utensil down when conversing to avoid mindless eating. Megan Norris provided verbal consent during today's appointment for this provider to send a handout about triggers via e-mail. Overall, Megan Norris was receptive to today's appointment as evidenced by openness to sharing, responsiveness to feedback, and willingness to explore triggers for emotional eating.  Mental Status Examination:  Appearance: neat Behavior: appropriate to circumstances Mood: neutral Affect: mood congruent Speech: WNL Eye Contact: appropriate Psychomotor Activity: WNL Gait: unable to assess Thought Process: linear, logical, and goal directed and no evidence or endorsement of suicidal, homicidal, and self-harm ideation, plan and intent  Thought Content/Perception: no hallucinations, delusions, bizarre thinking or behavior endorsed or observed Orientation: AAOx4 Memory/Concentration: intact Insight: fair Judgment: fair  Interventions:  Conducted a brief chart review Provided empathic reflections and validation Reviewed content from the previous session Employed supportive psychotherapy interventions to facilitate reduced distress and to improve coping skills with identified stressors Psychoeducation provided regarding triggers for emotional eating behaviors  DSM-5 Diagnosis(es): F50.89 Other Specified Feeding or Eating Disorder, Emotional Eating Behaviors and F90.9 Unspecified Attention-Deficit/Hyperactivity Disorder   Treatment Goal & Progress: During the initial appointment with this provider, the following treatment goal was established: increase coping skills. Progress is limited, as Megan Norris has just begun treatment with this provider; however, she is receptive to the interaction and interventions and rapport is being established.   Plan: Due to Megan Norris's work schedule and upcoming  vacation, the  next appointment is scheduled for 03/18/2024 at 2pm, which will be via MyChart Video Visit. The next session will focus on working towards the established treatment goal. Megan Norris provided verbal consent for this provider to e-mail referral options for therapeutic services. She will also continue with her psychiatric provider.     Wyatt Fire,  PsyD

## 2024-02-12 NOTE — Progress Notes (Signed)
 VIALS MADE 02-12-24

## 2024-02-13 DIAGNOSIS — J3089 Other allergic rhinitis: Secondary | ICD-10-CM | POA: Diagnosis not present

## 2024-02-16 ENCOUNTER — Other Ambulatory Visit (HOSPITAL_BASED_OUTPATIENT_CLINIC_OR_DEPARTMENT_OTHER): Payer: Self-pay

## 2024-02-16 ENCOUNTER — Ambulatory Visit (INDEPENDENT_AMBULATORY_CARE_PROVIDER_SITE_OTHER)

## 2024-02-16 DIAGNOSIS — J309 Allergic rhinitis, unspecified: Secondary | ICD-10-CM | POA: Diagnosis not present

## 2024-02-17 ENCOUNTER — Other Ambulatory Visit (HOSPITAL_BASED_OUTPATIENT_CLINIC_OR_DEPARTMENT_OTHER): Payer: Self-pay

## 2024-02-21 ENCOUNTER — Encounter (INDEPENDENT_AMBULATORY_CARE_PROVIDER_SITE_OTHER): Payer: Self-pay | Admitting: Family Medicine

## 2024-02-21 ENCOUNTER — Telehealth: Payer: Self-pay

## 2024-02-21 ENCOUNTER — Ambulatory Visit (INDEPENDENT_AMBULATORY_CARE_PROVIDER_SITE_OTHER): Admitting: Family Medicine

## 2024-02-21 ENCOUNTER — Ambulatory Visit (INDEPENDENT_AMBULATORY_CARE_PROVIDER_SITE_OTHER)

## 2024-02-21 ENCOUNTER — Other Ambulatory Visit (HOSPITAL_BASED_OUTPATIENT_CLINIC_OR_DEPARTMENT_OTHER): Payer: Self-pay

## 2024-02-21 VITALS — BP 105/70 | HR 62 | Temp 98.2°F | Ht 64.0 in | Wt 187.0 lb

## 2024-02-21 DIAGNOSIS — E88819 Insulin resistance, unspecified: Secondary | ICD-10-CM | POA: Diagnosis not present

## 2024-02-21 DIAGNOSIS — E559 Vitamin D deficiency, unspecified: Secondary | ICD-10-CM | POA: Diagnosis not present

## 2024-02-21 DIAGNOSIS — E66812 Obesity, class 2: Secondary | ICD-10-CM

## 2024-02-21 DIAGNOSIS — F5089 Other specified eating disorder: Secondary | ICD-10-CM

## 2024-02-21 DIAGNOSIS — J309 Allergic rhinitis, unspecified: Secondary | ICD-10-CM

## 2024-02-21 DIAGNOSIS — Z6832 Body mass index (BMI) 32.0-32.9, adult: Secondary | ICD-10-CM

## 2024-02-21 DIAGNOSIS — E7800 Pure hypercholesterolemia, unspecified: Secondary | ICD-10-CM

## 2024-02-21 DIAGNOSIS — F909 Attention-deficit hyperactivity disorder, unspecified type: Secondary | ICD-10-CM | POA: Diagnosis not present

## 2024-02-21 MED ORDER — VITAMIN D (ERGOCALCIFEROL) 1.25 MG (50000 UNIT) PO CAPS
50000.0000 [IU] | ORAL_CAPSULE | ORAL | 0 refills | Status: DC
Start: 2024-02-21 — End: 2024-03-20
  Filled 2024-02-21: qty 4, 28d supply, fill #0

## 2024-02-21 NOTE — Telephone Encounter (Signed)
 Patient has been having 3+ and 4+ reactions mostly to the pollen injections. Please advise if we can add epi-rinse to help reduce reactions to the allergy  injections.

## 2024-02-22 ENCOUNTER — Other Ambulatory Visit (HOSPITAL_BASED_OUTPATIENT_CLINIC_OR_DEPARTMENT_OTHER): Payer: Self-pay

## 2024-02-22 MED ORDER — AMPHETAMINE-DEXTROAMPHET ER 30 MG PO CP24
30.0000 mg | ORAL_CAPSULE | Freq: Every day | ORAL | 0 refills | Status: DC
  Filled 2024-07-22: qty 30, 30d supply, fill #0

## 2024-02-22 MED ORDER — AMPHETAMINE-DEXTROAMPHETAMINE 10 MG PO TABS
10.0000 mg | ORAL_TABLET | Freq: Every day | ORAL | 0 refills | Status: DC
  Filled 2024-07-18: qty 30, 30d supply, fill #0

## 2024-02-22 MED ORDER — AMPHETAMINE-DEXTROAMPHET ER 30 MG PO CP24
30.0000 mg | ORAL_CAPSULE | Freq: Every day | ORAL | 0 refills | Status: AC
  Filled 2024-03-18: qty 30, 30d supply, fill #0

## 2024-02-22 MED ORDER — AMPHETAMINE-DEXTROAMPHET ER 30 MG PO CP24: 30.0000 mg | ORAL_CAPSULE | Freq: Every day | ORAL | 0 refills | Status: AC

## 2024-02-22 MED ORDER — AMPHETAMINE-DEXTROAMPHETAMINE 10 MG PO TABS: 10.0000 mg | ORAL_TABLET | Freq: Every day | ORAL | 0 refills | Status: AC

## 2024-03-01 ENCOUNTER — Telehealth: Payer: Self-pay | Admitting: Allergy

## 2024-03-01 ENCOUNTER — Ambulatory Visit (INDEPENDENT_AMBULATORY_CARE_PROVIDER_SITE_OTHER)

## 2024-03-01 DIAGNOSIS — J309 Allergic rhinitis, unspecified: Secondary | ICD-10-CM

## 2024-03-05 NOTE — Telephone Encounter (Signed)
 Made in error

## 2024-03-05 NOTE — Telephone Encounter (Signed)
 EPI-RINSE has been added into the patient's flowsheet.

## 2024-03-07 ENCOUNTER — Ambulatory Visit

## 2024-03-07 ENCOUNTER — Other Ambulatory Visit (INDEPENDENT_AMBULATORY_CARE_PROVIDER_SITE_OTHER): Payer: Self-pay | Admitting: Family Medicine

## 2024-03-07 DIAGNOSIS — J309 Allergic rhinitis, unspecified: Secondary | ICD-10-CM

## 2024-03-07 DIAGNOSIS — E559 Vitamin D deficiency, unspecified: Secondary | ICD-10-CM

## 2024-03-11 ENCOUNTER — Other Ambulatory Visit (HOSPITAL_BASED_OUTPATIENT_CLINIC_OR_DEPARTMENT_OTHER): Payer: Self-pay

## 2024-03-11 ENCOUNTER — Encounter (HOSPITAL_BASED_OUTPATIENT_CLINIC_OR_DEPARTMENT_OTHER): Payer: Self-pay

## 2024-03-16 NOTE — Progress Notes (Signed)
 Megan Norris, D.O.  ABFM, ABOM Specializing in Clinical Bariatric Medicine  Office located at: [address] [place], [place]  [address]   Assessment and Plan:   Medications Discontinued During This Encounter  Medication Reason   Lurasidone  HCl 60 MG TABS Patient Preference   Olopatadine -Mometasone  (RYALTRIS ) 665-25 MCG/ACT SUSP Patient Preference   sodium fluoride  (LURIDE) 1.1 (0.5 F) MG/ML SOLN Patient Preference   Vitamin D , Ergocalciferol , (DRISDOL ) 1.25 MG (50000 UNIT) CAPS capsule Reorder    Meds ordered this encounter  Medications   Vitamin D , Ergocalciferol , (DRISDOL ) 1.25 MG (50000 UNIT) CAPS capsule    Sig: Take 1 capsule (50,000 Units total) by mouth every 7 (seven) days.    Dispense:  4 capsule    Refill:  0     FOR THE DISEASE OF OBESITY: Class 2 severe obesity due to excess calories with serious comorbidity and body mass index (BMI) of 35.0 to 35.9 in adult (HCC) BMI 32.0-32.9,adult current 32.10 Assessment & Plan: Since last office visit on 01/09/24 patient's muscle mass has decreased by 1.6 lbs. Fat mass has decreased by 6.4 lbs. Total body water has decreased by 3 lbs.  Counseling done on how various foods will affect these numbers and how to maximize success  Total lbs lost to date: 11 lbs Total weight loss percentage to date: -5.56 %   Recommended Dietary Goals Megan Norris is currently in the action stage of change. As such, her goal is to continue weight management plan.  She has agreed to: continue current plan CAT 2 MP w L options and given option to journal 1200-1400 calories and 90+ grams of protein per day.    Behavioral Intervention We discussed the following today: increasing lean protein intake to established goals, increasing water intake , work on tracking and journaling calories using tracking application, and identifying sources and decreasing liquid calories  Additional resources provided today: Handout on practicing mindfulness  around eating  Evidence-based interventions for health behavior change were utilized today including the discussion of self monitoring techniques, problem-solving barriers and SMART goal setting techniques.   Regarding patient's less desirable eating habits and patterns, we employed the technique of small changes.   Pt will specifically work on: try journaling a few days per week to increase mindfulness    Recommended Physical Activity Goals Megan Norris has been advised to work up to 300-450 minutes of moderate intensity aerobic activity a week and strengthening exercises 2-3 times per week for cardiovascular health, weight loss maintenance and preservation of muscle mass.   She has agreed to: Continue current level of physical activity    Pharmacotherapy We both agreed to: Continue with current nutritional and behavioral strategies   ASSOCIATED CONDITIONS ADDRESSED TODAY:  Insulin  resistance - new onset Assessment & Plan: Lab Results  Component Value Date   HGBA1C 5.6 12/26/2023   HGBA1C 5.6 06/06/2022   HGBA1C 5.5 06/01/2021   INSULIN  8.7 12/26/2023   No meds currently. Diet/lifestyle approach. Hunger/cravings are well controlled. Has been working on increasing protein, reducing simple carbs, and avoiding off-plan foods.   Continue efforts to follow her meal plan and exercising regular. Increase lean protein intake; counseled pt on benefits of protein for better control of hunger/cravings. Increase water intake to a gallon per day given pt is exercising regularly. Continue monitoring.    Unspecified Attention-Deficit/Hyperactivity Disorder Assessment & Plan: Condition is stable. Pt reports no acute concerns. Pt on Adderall XR 30 mg daily and Adderall 10 mg daily by 2  pm. Not taking short acting dose when waking up late at 11 am - 12 pm to avoid staying up until 2 am. Sleeping 5 hrs/night on avg w 2-3 hr naps.   Continue current regimen as prescribed. Reviewed importance of  adequate amt of sleep; aim for7-9 hrs/night. Continue following up with psychiatry as instructed by them. Will continue monitoring alongside PCP/specialists.     Other Specified Feeding or Eating Disorder, Emotional Eating Behaviors Assessment & Plan: Moods stable. Hunger/cravings are well controlled. Has been working w Dr. Sharron for emotional eating, stating it has been helpful. She plans to read resources provided to her before their next appt.   Follow up with Dr. Sharron as instructed by them. Continue working on implementation of her meal plan and regular exercise. Reviewed how her healthy habits -- exercise, increasing proteins, and reducing simple carbs -- helps with better control of hunger/cravings.     Vitamin D  deficiency Assessment & Plan: Lab Results  Component Value Date   VD25OH 39.9 12/26/2023   VD25OH 31.7 07/06/2023   VD25OH 33.1 06/06/2022   ERGO initiated at LOV. Currently compliant with ERGO 50K units once weekly. Good tolerance. Continue supplementation as prescribed. Refill ERGO today, no dose changes. Will recheck vit D levels in 3-4 months from last obtained.     Elevated LDL cholesterol level Assessment & Plan: Lab Results  Component Value Date   CHOL 202 (H) 12/26/2023   HDL 60 12/26/2023   LDLCALC 123 (H) 12/26/2023   TRIG 106 12/26/2023   CHOLHDL 3.4 12/26/2023   No meds currently; diet/lifestyle approach. Eats salmon regularly. Has not been reading labels for saturated or trans fats.   Continue efforts with heart healthy diet via her meal plan. Reduce foods high in saturated and trans fats. Increase lean proteins and avoid high fat proteins such as salmon and red meat.    Follow up:   Return in about 4 weeks (around 03/20/2024) for with Dr Midge. She was informed of the importance of frequent follow up visits to maximize her success with intensive lifestyle modifications for her multiple health conditions.  Subjective:   Chief complaint:  Obesity Megan Norris is here to discuss her progress with her obesity treatment plan. She is on the Category 2 Plan w L options and states she is following her eating plan approximately 70% of the time. She states she is lifting weights 60 minutes 3 days per week.  Interval History:  Megan Norris is here for a follow up office visit. Since last OV on 5/20, she is 8 lbs. Has been eating all her food at eat meal and working on making better food choices. Hunger and cravings are well controlled. When eating breakfast at home, she has a high protein yogurt. When ordering out, such as [employer], she gets grilled chicken and yogurt.  Pharmacotherapy that aid with weight loss: none  Review of Systems:  Pertinent positives were addressed with patient today.  Reviewed by clinician on day of visit: allergies, medications, problem list, medical history, surgical history, family history, social history, and previous encounter notes.  Weight Summary and Biometrics   *See synopsis for biometric data.*  Objective:   PHYSICAL EXAM: Blood pressure 105/70, pulse 62, temperature 98.2 F (36.8 C), height 5' 4 (1.626 m), weight 187 lb (84.8 kg), SpO2 99%. Body mass index is 32.1 kg/m.  General: she is overweight, cooperative and in no acute distress. PSYCH: Has normal mood, affect and thought process.   HEENT: EOMI, sclerae  are anicteric. Lungs: Normal breathing effort, no conversational dyspnea. Extremities: Moves * 4 Neurologic: A and O * 3, good insight  DIAGNOSTIC DATA REVIEWED: BMET    Component Value Date/Time   NA 138 12/26/2023 1039   K 4.1 12/26/2023 1039   CL 101 12/26/2023 1039   CO2 22 12/26/2023 1039   GLUCOSE 79 12/26/2023 1039   GLUCOSE 93 03/04/2018 0616   BUN 13 12/26/2023 1039   CREATININE 0.81 12/26/2023 1039   CALCIUM  9.4 12/26/2023 1039   GFRNONAA >60 03/04/2018 0616   GFRAA >60 03/04/2018 0616   Lab Results  Component Value Date   HGBA1C 5.6 12/26/2023   HGBA1C 5.5  06/01/2021   Lab Results  Component Value Date   INSULIN  8.7 12/26/2023   Lab Results  Component Value Date   TSH 0.977 12/26/2023   CBC    Component Value Date/Time   WBC 7.0 12/26/2023 1039   WBC 7.1 03/04/2018 0616   RBC 4.31 12/26/2023 1039   RBC 4.24 03/04/2018 0616   HGB 13.5 12/26/2023 1039   HCT 39.5 12/26/2023 1039   PLT 255 12/26/2023 1039   MCV 92 12/26/2023 1039   MCH 31.3 12/26/2023 1039   MCH 30.9 03/04/2018 0616   MCHC 34.2 12/26/2023 1039   MCHC 33.9 03/04/2018 0616   RDW 12.6 12/26/2023 1039   Iron Studies No results found for: IRON, TIBC, FERRITIN, IRONPCTSAT Lipid Panel     Component Value Date/Time   CHOL 202 (H) 12/26/2023 1039   TRIG 106 12/26/2023 1039   HDL 60 12/26/2023 1039   CHOLHDL 3.4 12/26/2023 1039   LDLCALC 123 (H) 12/26/2023 1039   Hepatic Function Panel     Component Value Date/Time   PROT 7.6 12/26/2023 1039   ALBUMIN 4.4 12/26/2023 1039   AST 16 12/26/2023 1039   ALT 11 12/26/2023 1039   ALKPHOS 57 12/26/2023 1039   BILITOT 0.7 12/26/2023 1039      Component Value Date/Time   TSH 0.977 12/26/2023 1039   Nutritional Lab Results  Component Value Date   VD25OH 39.9 12/26/2023   VD25OH 31.7 07/06/2023   VD25OH 33.1 06/06/2022    Attestations:   I, Megan Norris, acting as a Stage manager for Megan Jenkins, DO., have compiled all relevant documentation for today's office visit on behalf of Megan Jenkins, DO, while in the presence of [employer], DO.  I have reviewed the above documentation for accuracy and completeness, and I agree with the above. Megan Norris, D.O.  The 21st Century Cures Act was signed into law in 2016 which includes the topic of electronic health records.  This provides immediate access to information in MyChart.  This includes consultation notes, operative notes, office notes, lab results and pathology reports.  If you have any questions about what you read please let us  know at  your next visit so we can discuss your concerns and take corrective action if need be.  We are right here with you.

## 2024-03-18 ENCOUNTER — Other Ambulatory Visit (HOSPITAL_BASED_OUTPATIENT_CLINIC_OR_DEPARTMENT_OTHER): Payer: Self-pay

## 2024-03-18 ENCOUNTER — Telehealth (INDEPENDENT_AMBULATORY_CARE_PROVIDER_SITE_OTHER): Admitting: Psychology

## 2024-03-18 DIAGNOSIS — F909 Attention-deficit hyperactivity disorder, unspecified type: Secondary | ICD-10-CM | POA: Diagnosis not present

## 2024-03-18 DIAGNOSIS — F5089 Other specified eating disorder: Secondary | ICD-10-CM

## 2024-03-18 NOTE — Progress Notes (Signed)
  Office: [phone]  /  Fax: [phone]    Date: March 18, 2024  Appointment Start Time: 2:04pm Duration: 23 minutes Provider: Wyatt Fire, Psy.D. Type of Session: Individual Therapy  Location of Patient: Home (private location) Location of Provider: Provider's Home (private office) Type of Contact: Telepsychological Visit via MyChart Video Visit  Session Content: Megan Norris is a 33 y.o. female presenting for a follow-up appointment to address the previously established treatment goal of increasing coping skills. Today's appointment was a telepsychological visit. Megan Norris provided verbal consent for today's telepsychological appointment and she is aware she is responsible for securing confidentiality on her end of the session. Prior to proceeding with today's appointment, Megan Norris's physical location at the time of this appointment was obtained as well a phone number she could be reached at in the event of technical difficulties. Megan Norris and this provider participated in today's telepsychological service.   This provider conducted a brief check-in. Megan Norris shared about her recent trip to [place]. Discussed what went well as it relates to her eating habits while on vacation and what she could do differently during future vacations. Notably, she shared she discontinued Latuda  and informed her psychiatrist, adding she disclosed her trauma hx to her psychiatrist as well. Reviewed triggers for emotional eating behaviors. Of note, she disclosed sometimes mindlessly eating and/or eating quickly due to work. Psychoeducation provided regarding mindful eating strategies (sit down, slowly chew, savor, simplify, and smile). Overall, Megan Norris was receptive to today's appointment as evidenced by openness to sharing, responsiveness to feedback, and willingness to engage in mindful eating strategies.  Mental Status Examination:  Appearance: neat Behavior: appropriate to circumstances Mood: neutral Affect: mood  congruent Speech: WNL Eye Contact: appropriate Psychomotor Activity: WNL Gait: unable to assess Thought Process: linear, logical, and goal directed and denies suicidal, homicidal, and self-harm ideation, plan and intent  Thought Content/Perception: no hallucinations, delusions, bizarre thinking or behavior endorsed or observed Orientation: AAOx4 Memory/Concentration: intact Insight: fair Judgment: fair  Interventions:  Conducted a brief chart review Provided empathic reflections and validation Reviewed content from the previous session Provided positive reinforcement Employed supportive psychotherapy interventions to facilitate reduced distress and to improve coping skills with identified stressors Psychoeducation provided regarding mindful eating strategies  DSM-5 Diagnosis(es): F50.89 Other Specified Feeding or Eating Disorder, Emotional Eating Behaviors and F90.9 Unspecified Attention-Deficit/Hyperactivity Disorder   Treatment Goal & Progress: During the initial appointment with this provider, the following treatment goal was established: increase coping skills. Megan Norris has demonstrated progress in her goal as evidenced by increased awareness of hunger patterns and increased awareness of triggers for emotional eating behaviors. Megan Norris also demonstrates willingness to implement mindful eating strategies.   Plan: The next appointment is scheduled for 04/02/2024 at 2pm, which will be via MyChart Video Visit. The next session will focus on working towards the established treatment goal. Megan Norris indicated she is waiting to hear back from a possible primary therapist. She will also continue with her psychiatric provider.    Wyatt Fire, PsyD

## 2024-03-20 ENCOUNTER — Other Ambulatory Visit (HOSPITAL_BASED_OUTPATIENT_CLINIC_OR_DEPARTMENT_OTHER): Payer: Self-pay

## 2024-03-20 ENCOUNTER — Ambulatory Visit (INDEPENDENT_AMBULATORY_CARE_PROVIDER_SITE_OTHER)

## 2024-03-20 ENCOUNTER — Encounter (INDEPENDENT_AMBULATORY_CARE_PROVIDER_SITE_OTHER): Payer: Self-pay | Admitting: Family Medicine

## 2024-03-20 ENCOUNTER — Ambulatory Visit (INDEPENDENT_AMBULATORY_CARE_PROVIDER_SITE_OTHER): Admitting: Family Medicine

## 2024-03-20 VITALS — BP 108/69 | HR 64 | Temp 98.2°F | Ht 64.0 in | Wt 188.0 lb

## 2024-03-20 DIAGNOSIS — E559 Vitamin D deficiency, unspecified: Secondary | ICD-10-CM

## 2024-03-20 DIAGNOSIS — Z6832 Body mass index (BMI) 32.0-32.9, adult: Secondary | ICD-10-CM

## 2024-03-20 DIAGNOSIS — E66812 Obesity, class 2: Secondary | ICD-10-CM

## 2024-03-20 DIAGNOSIS — J309 Allergic rhinitis, unspecified: Secondary | ICD-10-CM | POA: Diagnosis not present

## 2024-03-20 DIAGNOSIS — F5089 Other specified eating disorder: Secondary | ICD-10-CM | POA: Diagnosis not present

## 2024-03-20 MED ORDER — VITAMIN D (ERGOCALCIFEROL) 1.25 MG (50000 UNIT) PO CAPS
50000.0000 [IU] | ORAL_CAPSULE | ORAL | 0 refills | Status: DC
  Filled 2024-03-20: qty 4, 28d supply, fill #0

## 2024-03-20 NOTE — Progress Notes (Signed)
 Megan Norris, D.O.  ABFM, ABOM Specializing in Clinical Bariatric Medicine  Office located at: [address] [place], [place]  [address]   Assessment and Plan:   Medications Discontinued During This Encounter  Medication Reason   Vitamin D , Ergocalciferol , (DRISDOL ) 1.25 MG (50000 UNIT) CAPS capsule Reorder    Meds ordered this encounter  Medications   Vitamin D , Ergocalciferol , (DRISDOL ) 1.25 MG (50000 UNIT) CAPS capsule    Sig: Take 1 capsule (50,000 Units total) by mouth every 7 (seven) days.    Dispense:  4 capsule    Refill:  0     Obtain labs at next OV -- fasting insulin , BMP, and vit D.    FOR THE DISEASE OF OBESITY: Class 2 severe obesity due to excess calories with serious comorbidity and body mass index (BMI) of 35.0 to 35.9 in adult (HCC) BMI 32.0-32.9,adult current 32.27 Assessment & Plan: Since last office visit on 02/21/24 patient's muscle mass has increased by 0.4 lbs. Fat mass has increased by 0.6 lbs. Total body water has increased by 1.4 lbs.  Body fat % has increased by 0.2%. Counseling done on how various foods will affect these numbers and how to maximize success  Total lbs lost to date: 10 lbs Total weight loss percentage to date: -5.05%   Recommended Dietary Goals Megan Norris is currently in the action stage of change. As such, her goal is to continue weight management plan.  Encouraged pt to journal (previous parameters discussed 1200-1400 calories and 90+ g of protein per day). Pt does not prefer to journal, she will stick to CAT 2 MP w L options.   She has agreed to: continue current plan    Behavioral Intervention We discussed the following today: increasing lean protein intake to established goals, decreasing simple carbohydrates , increasing water intake , and continue to work on implementation of reduced calorie nutritional plan  Additional resources provided today: None  Evidence-based interventions for health behavior change were  utilized today including the discussion of self monitoring techniques, problem-solving barriers and SMART goal setting techniques.   Regarding patient's less desirable eating habits and patterns, we employed the technique of small changes.   Pt will specifically work on: Increase daily water intake to 60 ounces/day   Recommended Physical Activity Goals Megan Norris has been advised to work up to 300-450 minutes of moderate intensity aerobic activity a week and strengthening exercises 2-3 times per week for cardiovascular health, weight loss maintenance and preservation of muscle mass.   She has agreed to: Continue current level of physical activity    Pharmacotherapy We both agreed to: Continue with current nutritional and behavioral strategies   ASSOCIATED CONDITIONS ADDRESSED TODAY:  Vitamin D  deficiency Assessment & Plan: Lab Results  Component Value Date   VD25OH 39.9 12/26/2023   VD25OH 31.7 07/06/2023   VD25OH 33.1 06/06/2022   Pt on ERGO 50K units once weekly. Good compliance and tolerance. No adverse SE or acute concerns in this regard. Continue with current supplementation. Will send refill today, no dose changes made.     Other Specified Feeding or Eating Disorder, Emotional Eating Behaviors Assessment & Plan: Moods are stable. Pt is following up with Dr. Sharron and is still determining whether it is helpful. Reports increased snacking while recently on vacation. Increase her water intake, but still under recommended amount/day.   Continue f/u w Dr. Sharron for continued support with emotional eating. Reviewed the benefits of journaling for increased mindfulness. Pt declined, prefers not  to journal. Increase water intake to at least 1/2 her body wt in ounces of water per day. Will continue to monitor alongside PCP/Dr. Sharron.     Follow up:   Return in about 4 weeks (around 04/17/2024) for 1 month f/u.  She was informed of the importance of frequent follow up visits to  maximize her success with intensive lifestyle modifications for her multiple health conditions.  Subjective:   Chief complaint: Obesity Megan Norris is here to discuss her progress with her obesity treatment plan. She is on the Category 2 Plan w L options and states she is following her eating plan approximately 75% of the time. She states she is working out 60 minutes 3 days per week.   Interval History:  Megan Norris is here for a follow up office visit. Since last OV on 02/21/24, she is up 1 lb. Recently came back from [place], [place] and although she did drink alcohol, she was still prioritizing eating healthy overall. Did plenty of walking while on vacation. Ate plenty of fresh fruits and lean proteins.  She did not indulge in deserts. Has been avoiding eating in front of screens for increased mindfulness. Did not journal much since her LOV.   Pharmacotherapy that aid with weight loss: She is currently taking no anti-obesity medication.   Review of Systems:  Pertinent positives were addressed with patient today.  Reviewed by clinician on day of visit: allergies, medications, problem list, medical history, surgical history, family history, social history, and previous encounter notes.  Weight Summary and Biometrics   Weight Lost Since Last Visit: 0lb  Weight Gained Since Last Visit: 1lb   Vitals Temp: 98.2 F (36.8 C) BP: 108/69 Pulse Rate: 64 SpO2: 100 %   Anthropometric Measurements Height: 5' 4 (1.626 m) Weight: 188 lb (85.3 kg) BMI (Calculated): 32.25 Weight at Last Visit: 187lb Weight Lost Since Last Visit: 0lb Weight Gained Since Last Visit: 1lb Starting Weight: 198lb Total Weight Loss (lbs): 10 lb (4.536 kg) Peak Weight: 211lb Waist Measurement : 41 inches   Body Composition  Body Fat %: 34.4 % Fat Mass (lbs): 64.8 lbs Muscle Mass (lbs): 117.6 lbs Total Body Water (lbs): 77.2 lbs Visceral Fat Rating : 6   Other Clinical Data RMR: [id number] Fasting: No Labs:  no Today's Visit #: 4 Starting Date: 12/26/23 Comments: Cat 2/L option    Objective:   PHYSICAL EXAM: Blood pressure 108/69, pulse 64, temperature 98.2 F (36.8 C), height 5' 4 (1.626 m), weight 188 lb (85.3 kg), SpO2 100%. Body mass index is 32.27 kg/m.  General: she is overweight, cooperative and in no acute distress. PSYCH: Has normal mood, affect and thought process.   HEENT: EOMI, sclerae are anicteric. Lungs: Normal breathing effort, no conversational dyspnea. Extremities: Moves * 4 Neurologic: A and O * 3, good insight  DIAGNOSTIC DATA REVIEWED: BMET    Component Value Date/Time   NA 138 12/26/2023 1039   K 4.1 12/26/2023 1039   CL 101 12/26/2023 1039   CO2 22 12/26/2023 1039   GLUCOSE 79 12/26/2023 1039   GLUCOSE 93 03/04/2018 0616   BUN 13 12/26/2023 1039   CREATININE 0.81 12/26/2023 1039   CALCIUM  9.4 12/26/2023 1039   GFRNONAA >60 03/04/2018 0616   GFRAA >60 03/04/2018 0616   Lab Results  Component Value Date   HGBA1C 5.6 12/26/2023   HGBA1C 5.5 06/01/2021   Lab Results  Component Value Date   INSULIN  8.7 12/26/2023   Lab Results  Component Value  Date   TSH 0.977 12/26/2023   CBC    Component Value Date/Time   WBC 7.0 12/26/2023 1039   WBC 7.1 03/04/2018 0616   RBC 4.31 12/26/2023 1039   RBC 4.24 03/04/2018 0616   HGB 13.5 12/26/2023 1039   HCT 39.5 12/26/2023 1039   PLT 255 12/26/2023 1039   MCV 92 12/26/2023 1039   MCH 31.3 12/26/2023 1039   MCH 30.9 03/04/2018 0616   MCHC 34.2 12/26/2023 1039   MCHC 33.9 03/04/2018 0616   RDW 12.6 12/26/2023 1039   Iron Studies No results found for: IRON, TIBC, FERRITIN, IRONPCTSAT Lipid Panel     Component Value Date/Time   CHOL 202 (H) 12/26/2023 1039   TRIG 106 12/26/2023 1039   HDL 60 12/26/2023 1039   CHOLHDL 3.4 12/26/2023 1039   LDLCALC 123 (H) 12/26/2023 1039   Hepatic Function Panel     Component Value Date/Time   PROT 7.6 12/26/2023 1039   ALBUMIN 4.4 12/26/2023 1039    AST 16 12/26/2023 1039   ALT 11 12/26/2023 1039   ALKPHOS 57 12/26/2023 1039   BILITOT 0.7 12/26/2023 1039      Component Value Date/Time   TSH 0.977 12/26/2023 1039   Nutritional Lab Results  Component Value Date   VD25OH 39.9 12/26/2023   VD25OH 31.7 07/06/2023   VD25OH 33.1 06/06/2022    Attestations:   I, Vernell Forest, acting as a Stage manager for Megan Jenkins, DO., have compiled all relevant documentation for today's office visit on behalf of Megan Jenkins, DO, while in the presence of [employer], DO.  I have reviewed the above documentation for accuracy and completeness, and I agree with the above. Megan JINNY Norris, D.O.  The 21st Century Cures Act was signed into law in 2016 which includes the topic of electronic health records.  This provides immediate access to information in MyChart.  This includes consultation notes, operative notes, office notes, lab results and pathology reports.  If you have any questions about what you read please let us  know at your next visit so we can discuss your concerns and take corrective action if need be.  We are right here with you.

## 2024-03-25 ENCOUNTER — Encounter (INDEPENDENT_AMBULATORY_CARE_PROVIDER_SITE_OTHER): Payer: Self-pay

## 2024-03-27 ENCOUNTER — Ambulatory Visit (INDEPENDENT_AMBULATORY_CARE_PROVIDER_SITE_OTHER)

## 2024-03-27 DIAGNOSIS — J309 Allergic rhinitis, unspecified: Secondary | ICD-10-CM

## 2024-04-02 ENCOUNTER — Telehealth (INDEPENDENT_AMBULATORY_CARE_PROVIDER_SITE_OTHER): Admitting: Psychology

## 2024-04-02 DIAGNOSIS — F5089 Other specified eating disorder: Secondary | ICD-10-CM | POA: Diagnosis not present

## 2024-04-02 DIAGNOSIS — F909 Attention-deficit hyperactivity disorder, unspecified type: Secondary | ICD-10-CM

## 2024-04-02 NOTE — Progress Notes (Signed)
  Office: [phone]  /  Fax: [phone]    Date: April 02, 2024  Appointment Start Time: 2:02pm Duration: 24 minutes Provider: Wyatt Fire, Psy.D. Type of Session: Individual Therapy  Location of Patient: Home (private location) Location of Provider: Provider's Home (private office) Type of Contact: Telepsychological Visit via MyChart Video Visit  Session Content: Elbia is a 33 y.o. female presenting for a follow-up appointment to address the previously established treatment goal of increasing coping skills. Today's appointment was a telepsychological visit. Fabian provided verbal consent for today's telepsychological appointment and she is aware she is responsible for securing confidentiality on her end of the session. Prior to proceeding with today's appointment, Sumi's physical location at the time of this appointment was obtained as well a phone number she could be reached at in the event of technical difficulties. Gentri and this provider participated in today's telepsychological service.   This provider conducted a brief check-in. Gabbie reported she established care with a primary therapist yesterday. She also shared about her last OV with Dr. Midge, noting she is trying to cook more at home and increase water intake. Additionally, Rosaly indicated she implemented discussed mindful eating strategies; however, she noted she is not eating enough and her sleep schedule is really bad. Sleep habits further explored. Jeane described having interrupted sleep. Psychoeducation provided regarding sleep hygiene and hormones associated with sleep/weight loss (e.g., ghrelin and leptin). Trease provided verbal consent during today's appointment for this provider to send a handout about sleep hygiene via e-mail. Overall, Dede was receptive to today's appointment as evidenced by openness to sharing, responsiveness to feedback, and willingness to implement sleep hygiene  strategies.  Mental Status Examination:  Appearance: neat Behavior: appropriate to circumstances Mood: neutral Affect: mood congruent Speech: WNL Eye Contact: appropriate Psychomotor Activity: WNL Gait: unable to assess Thought Process: linear, logical, and goal directed and no evidence of suicidal, homicidal, and self-harm ideation, plan and intent  Thought Content/Perception: no hallucinations, delusions, bizarre thinking or behavior endorsed or observed Orientation: AAOx4 Memory/Concentration: intact Insight: fair Judgment: fair   Interventions:  Conducted a brief chart review Provided empathic reflections and validation Reviewed content from the previous session Provided positive reinforcement Employed supportive psychotherapy interventions to facilitate reduced distress and to improve coping skills with identified stressors Psychoeducation provided regarding sleep hygiene Psychoeducation provided regarding hormones associated with sleep/weight loss  DSM-5 Diagnosis(es): F50.89 Other Specified Feeding or Eating Disorder, Emotional Eating Behaviors and F90.9 Unspecified Attention-Deficit/Hyperactivity Disorder   Treatment Goal & Progress: During the initial appointment with this provider, the following treatment goal was established: increase coping skills. Loreli has demonstrated progress in her goal as evidenced by increased awareness of hunger patterns and increased awareness of triggers for emotional eating behaviors. Manuela also demonstrates willingness to implement mindful eating strategies.    Plan: The next appointment is scheduled for 04/23/2024 at 2:30pm, which will be via MyChart Video Visit. The next session will focus on working towards the established treatment goal. Camara will continue with her primary therapist and psychiatric provider.    Wyatt Fire, PsyD

## 2024-04-12 ENCOUNTER — Ambulatory Visit (INDEPENDENT_AMBULATORY_CARE_PROVIDER_SITE_OTHER)

## 2024-04-12 DIAGNOSIS — J309 Allergic rhinitis, unspecified: Secondary | ICD-10-CM

## 2024-04-15 ENCOUNTER — Ambulatory Visit (INDEPENDENT_AMBULATORY_CARE_PROVIDER_SITE_OTHER): Admitting: Family Medicine

## 2024-04-15 ENCOUNTER — Encounter (INDEPENDENT_AMBULATORY_CARE_PROVIDER_SITE_OTHER): Payer: Self-pay | Admitting: Family Medicine

## 2024-04-15 ENCOUNTER — Other Ambulatory Visit (HOSPITAL_BASED_OUTPATIENT_CLINIC_OR_DEPARTMENT_OTHER): Payer: Self-pay

## 2024-04-15 VITALS — BP 124/82 | HR 60 | Temp 97.8°F | Ht 64.0 in | Wt 193.0 lb

## 2024-04-15 DIAGNOSIS — E559 Vitamin D deficiency, unspecified: Secondary | ICD-10-CM | POA: Diagnosis not present

## 2024-04-15 DIAGNOSIS — Z6832 Body mass index (BMI) 32.0-32.9, adult: Secondary | ICD-10-CM

## 2024-04-15 DIAGNOSIS — E7800 Pure hypercholesterolemia, unspecified: Secondary | ICD-10-CM

## 2024-04-15 DIAGNOSIS — Z6833 Body mass index (BMI) 33.0-33.9, adult: Secondary | ICD-10-CM

## 2024-04-15 DIAGNOSIS — E66812 Obesity, class 2: Secondary | ICD-10-CM | POA: Diagnosis not present

## 2024-04-15 MED ORDER — VITAMIN D (ERGOCALCIFEROL) 1.25 MG (50000 UNIT) PO CAPS
50000.0000 [IU] | ORAL_CAPSULE | ORAL | 0 refills | Status: DC
  Filled 2024-04-15: qty 4, 28d supply, fill #0

## 2024-04-15 MED ORDER — SEMAGLUTIDE-WEIGHT MANAGEMENT 0.25 MG/0.5ML ~~LOC~~ SOAJ
0.2500 mg | SUBCUTANEOUS | 0 refills | Status: AC
  Filled 2024-04-15: qty 2, 28d supply, fill #0

## 2024-04-16 ENCOUNTER — Other Ambulatory Visit (HOSPITAL_BASED_OUTPATIENT_CLINIC_OR_DEPARTMENT_OTHER): Payer: Self-pay

## 2024-04-16 ENCOUNTER — Telehealth (INDEPENDENT_AMBULATORY_CARE_PROVIDER_SITE_OTHER): Payer: Self-pay

## 2024-04-16 LAB — BASIC METABOLIC PANEL WITH GFR
BUN/Creatinine Ratio: 17 (ref 9–23)
BUN: 13 mg/dL (ref 6–20)
CO2: 24 mmol/L (ref 20–29)
Calcium: 9.5 mg/dL (ref 8.7–10.2)
Chloride: 99 mmol/L (ref 96–106)
Creatinine, Ser: 0.75 mg/dL (ref 0.57–1.00)
Glucose: 84 mg/dL (ref 70–99)
Potassium: 3.7 mmol/L (ref 3.5–5.2)
Sodium: 136 mmol/L (ref 134–144)
eGFR: 107 mL/min/1.73 (ref >59)

## 2024-04-16 LAB — VITAMIN D 25 HYDROXY (VIT D DEFICIENCY, FRACTURES): Vit D, 25-Hydroxy: 59.4 ng/mL (ref 30.0–100.0)

## 2024-04-16 NOTE — Telephone Encounter (Signed)
 Called and left the patient a message to return my call. I need to get a copy of the patients most recent insurance card in order to complete a PA for her medication. The last copy we have was made in 2023, and its not even the whole card.

## 2024-04-16 NOTE — Progress Notes (Signed)
 [name], D.O.  ABFM, ABOM Specializing in Clinical Bariatric Medicine  Office located at: [address] [place], [place]  [address]   Assessment and Plan:   Orders Placed This Encounter  Procedures   Basic metabolic panel with GFR   VITAMIN D  25 Hydroxy (Vit-D Deficiency, Fractures)    Medications Discontinued During This Encounter  Medication Reason   Vitamin D , Ergocalciferol , (DRISDOL ) 1.25 MG (50000 UNIT) CAPS capsule Reorder     Meds ordered this encounter  Medications   Vitamin D , Ergocalciferol , (DRISDOL ) 1.25 MG (50000 UNIT) CAPS capsule    Sig: Take 1 capsule (50,000 Units total) by mouth every 7 (seven) days.    Dispense:  4 capsule    Refill:  0   semaglutide -weight management (WEGOVY ) 0.25 MG/0.5ML SOAJ SQ injection    Sig: Inject 0.25 mg into the skin once a week.    Dispense:  2 mL    Refill:  0     FOR THE DISEASE OF OBESITY:  BMI 32.0-32.9,adult current 33.11 Class 2 severe obesity due to excess calories with serious comorbidity and body mass index (BMI) of 35.0 to 35.9 in adult [facility]) start BMI 33.99 on 12/26/23 Assessment & Plan: Since last office visit on 03/20/2024 patient's muscle mass has decreased by 1.6 lbs. Fat mass has decreased by 6.6 lbs. Total body water has decreased by 2.2 lbs.  Body fat % has increased by 2.5 %. Counseling done on how various foods will affect these numbers and how to maximize success  Total lbs lost to date: - 5 lbs Total weight loss percentage to date: -2.53 %   Recommended Dietary Goals Nuha is currently in the action stage of change. As such, her goal is to continue weight management plan.  She has agreed to: continue current plan   Behavioral Intervention We discussed the following today: increasing lean protein intake to established goals, decreasing simple carbohydrates , and continue to work on implementation of reduced calorie nutritional plan  Additional resources provided today:  n/a  Evidence-based interventions for health behavior change were utilized today including the discussion of self monitoring techniques, problem-solving barriers and SMART goal setting techniques.   Regarding patient's less desirable eating habits and patterns, we employed the technique of small changes.   Pt will specifically work on: n/a   Recommended Physical Activity Goals Ama has been advised to work up to 300-450 minutes of moderate intensity aerobic activity a week and strengthening exercises 2-3 times per week for cardiovascular health, weight loss maintenance and preservation of muscle mass.   She was encouraged to continue to gradually increase the amount and intensity of exercise routine   Pharmacotherapy In addition to reduced calorie nutrition plan (RCNP), behavioral strategies and physical activity, Shakevia would benefit from pharmacotherapy to assist with  cravings. This will reduce obesity-related health risks by inducing weight loss, and help reduce food consumption and adherence to [facility]) . It may also improve QOL by improving self-confidence and reduce the  setbacks associated with metabolic adaptations.  After discussion of treatment options, mechanisms of action, benefits, side effects, contraindications and shared decision making she is agreeable to starting Wegovy  0.25 mg weekly. She denies a personal history of pancreatitis;  and denies a family history of medullary thyroid  carcinoma or multiple endocrine neoplasia type II. Patient also made aware that medication is indicated for long-term management of obesity and the risk of weight regain following discontinuation of treatment and hence the importance of adhering to  medical weight loss plan.  We demonstrated use of proper use of device.     ASSOCIATED CONDITIONS ADDRESSED TODAY:   Elevated LDL cholesterol level Assessment & Plan: Lab Results  Component Value Date   CHOL 202 (H) 12/26/2023   HDL 60 12/26/2023    LDLCALC 123 (H) 12/26/2023   TRIG 106 12/26/2023   CHOLHDL 3.4 12/26/2023   She has a history of elevated LDL cholesterol managed with dietary and life style interventions. She has been working on reducing her saturated and trans fat intake the majority of the time. Cont prudent nutritional plan. Increase exercise as able. Lipid panel; future.     Vitamin D  deficiency Assessment & Plan: Lab Results  Component Value Date   VD25OH 39.9 12/26/2023   VD25OH 31.7 07/06/2023   Currently on Ergocalciferol  50,000 units wkly with good compliance and tolerance. No acute concerns. Continue regimen and weight loss efforts. Recheck levels today.    Follow up:   Return 05/22/2024 at 11:20 AM.  She was informed of the importance of frequent follow up visits to maximize her success with intensive lifestyle modifications for her multiple health conditions.  Makailah L Nilson is aware that we will review all of her lab results at our next visit together in person.  She is aware that if anything is critical/ life threatening with the results, we will be contacting her via MyChart or by my CMA will be calling them prior to the office visit to discuss acute management.     Subjective:    Chief complaint: Obesity Lelia is here to discuss her progress with her obesity treatment plan. She is on the Category 2 Plan with [employer] and states she is following her eating plan approximately 75% of the time. She states she is doing cardio and strength training 60 minutes 3-4 days per week.   Interval History:  Bailynn L Scicchitano is here for a follow up office visit. Since last OV on 03/20/2024 , she is up 5 lbs. She attributes her wt gain to eating pizza, fried chicken sandwiches, and drinking alcohol this past weekend. She also has been more sedentary the past 2 weeks because she is in between contracts for her job. Off scale win: pt drinking more water. She endorses having persistent cravings.     Pharmacotherapy that aid with weight loss: none   Review of Systems:  Pertinent positives were addressed with patient today.  Reviewed by clinician on day of visit: allergies, medications, problem list, medical history, surgical history, family history, social history, and previous encounter notes.  Weight Summary and Biometrics   Weight Lost Since Last Visit: 0lb  Weight Gained Since Last Visit: 5lb  Vitals Temp: 97.8 F (36.6 C) BP: 124/82 Pulse Rate: 60 SpO2: 98 %   Anthropometric Measurements Height: 5' 4 (1.626 m) Weight: 193 lb (87.5 kg) BMI (Calculated): 33.11 Weight at Last Visit: 188lb Weight Lost Since Last Visit: 0lb Weight Gained Since Last Visit: 5lb Starting Weight: 198lb Total Weight Loss (lbs): 5 lb (2.268 kg) Peak Weight: 211lb Waist Measurement : 41 inches   Body Composition  Body Fat %: 36.9 % Fat Mass (lbs): 71.4 lbs Muscle Mass (lbs): 116 lbs Total Body Water (lbs): 79.4 lbs Visceral Fat Rating : 7   Other Clinical Data Fasting: no Labs: yes Today's Visit #: 5 Starting Date: 12/26/23    Objective:   PHYSICAL EXAM: Blood pressure 124/82, pulse 60, temperature 97.8 F (36.6 C), height 5' 4 (1.626 m), weight  193 lb (87.5 kg), SpO2 98%. Body mass index is 33.13 kg/m.  General: she is overweight, cooperative and in no acute distress. PSYCH: Has normal mood, affect and thought process.   HEENT: EOMI, sclerae are anicteric. Lungs: Normal breathing effort, no conversational dyspnea. Extremities: Moves * 4 Neurologic: A and O * 3, good insight  DIAGNOSTIC DATA REVIEWED: BMET    Component Value Date/Time   NA 136 04/15/2024 1509   K 3.7 04/15/2024 1509   CL 99 04/15/2024 1509   CO2 24 04/15/2024 1509   GLUCOSE 84 04/15/2024 1509   GLUCOSE 93 03/04/2018 0616   BUN 13 04/15/2024 1509   CREATININE 0.75 04/15/2024 1509   CALCIUM  9.5 04/15/2024 1509   GFRNONAA >60 03/04/2018 0616   GFRAA >60 03/04/2018 0616   Lab Results   Component Value Date   HGBA1C 5.6 12/26/2023   HGBA1C 5.5 06/01/2021   Lab Results  Component Value Date   INSULIN  8.7 12/26/2023   Lab Results  Component Value Date   TSH 0.977 12/26/2023   CBC    Component Value Date/Time   WBC 7.0 12/26/2023 1039   WBC 7.1 03/04/2018 0616   RBC 4.31 12/26/2023 1039   RBC 4.24 03/04/2018 0616   HGB 13.5 12/26/2023 1039   HCT 39.5 12/26/2023 1039   PLT 255 12/26/2023 1039   MCV 92 12/26/2023 1039   MCH 31.3 12/26/2023 1039   MCH 30.9 03/04/2018 0616   MCHC 34.2 12/26/2023 1039   MCHC 33.9 03/04/2018 0616   RDW 12.6 12/26/2023 1039   Iron Studies No results found for: IRON, TIBC, FERRITIN, IRONPCTSAT Lipid Panel     Component Value Date/Time   CHOL 202 (H) 12/26/2023 1039   TRIG 106 12/26/2023 1039   HDL 60 12/26/2023 1039   CHOLHDL 3.4 12/26/2023 1039   LDLCALC 123 (H) 12/26/2023 1039   Hepatic Function Panel     Component Value Date/Time   PROT 7.6 12/26/2023 1039   ALBUMIN 4.4 12/26/2023 1039   AST 16 12/26/2023 1039   ALT 11 12/26/2023 1039   ALKPHOS 57 12/26/2023 1039   BILITOT 0.7 12/26/2023 1039      Component Value Date/Time   TSH 0.977 12/26/2023 1039   Nutritional Lab Results  Component Value Date   VD25OH 59.4 04/15/2024   VD25OH 39.9 12/26/2023   VD25OH 31.7 07/06/2023    Attestations:   I, Special Puri, acting as a Stage manager for [employer], DO., have compiled all relevant documentation for today's office visit on behalf of Barnie Jenkins, DO, while in the presence of [employer], DO.  I have reviewed the above documentation for accuracy and completeness, and I agree with the above. Barnie JINNY Jenkins, D.O.  The 21st Century Cures Act was signed into law in 2016 which includes the topic of electronic health records.  This provides immediate access to information in MyChart.  This includes consultation notes, operative notes, office notes, lab results and pathology reports.  If you  have any questions about what you read please let us  know at your next visit so we can discuss your concerns and take corrective action if need be.  We are right here with you.

## 2024-04-18 ENCOUNTER — Telehealth (INDEPENDENT_AMBULATORY_CARE_PROVIDER_SITE_OTHER): Payer: Self-pay | Admitting: *Deleted

## 2024-04-18 ENCOUNTER — Telehealth (INDEPENDENT_AMBULATORY_CARE_PROVIDER_SITE_OTHER): Payer: Self-pay | Admitting: Family Medicine

## 2024-04-18 ENCOUNTER — Ambulatory Visit (INDEPENDENT_AMBULATORY_CARE_PROVIDER_SITE_OTHER): Admitting: Family Medicine

## 2024-04-18 NOTE — Telephone Encounter (Signed)
 Message from plan:  Request Reference Number: [id number]. WEGOVY  INJ 0.25MG  is approved through 10/19/2024.  For further questions, call [employer] at [phone]  Authorization Expiration Date: October 19, 2024.   Patient notified via Mychart.

## 2024-04-18 NOTE — Telephone Encounter (Signed)
 8/28 pt called back stating she received a call about her insurance from [place], unsure what it was regarding

## 2024-04-18 NOTE — Telephone Encounter (Signed)
 Nawal Fazekas (Key: [id number])  Your information has been sent to [employer].

## 2024-04-19 ENCOUNTER — Other Ambulatory Visit (HOSPITAL_BASED_OUTPATIENT_CLINIC_OR_DEPARTMENT_OTHER): Payer: Self-pay

## 2024-04-23 ENCOUNTER — Telehealth (INDEPENDENT_AMBULATORY_CARE_PROVIDER_SITE_OTHER): Admitting: Psychology

## 2024-04-29 ENCOUNTER — Other Ambulatory Visit (HOSPITAL_BASED_OUTPATIENT_CLINIC_OR_DEPARTMENT_OTHER): Payer: Self-pay

## 2024-04-29 ENCOUNTER — Ambulatory Visit (INDEPENDENT_AMBULATORY_CARE_PROVIDER_SITE_OTHER)

## 2024-04-29 DIAGNOSIS — J309 Allergic rhinitis, unspecified: Secondary | ICD-10-CM | POA: Diagnosis not present

## 2024-05-07 ENCOUNTER — Other Ambulatory Visit (INDEPENDENT_AMBULATORY_CARE_PROVIDER_SITE_OTHER): Payer: Self-pay | Admitting: Family Medicine

## 2024-05-07 ENCOUNTER — Encounter (HOSPITAL_BASED_OUTPATIENT_CLINIC_OR_DEPARTMENT_OTHER): Payer: Self-pay

## 2024-05-07 ENCOUNTER — Other Ambulatory Visit (HOSPITAL_BASED_OUTPATIENT_CLINIC_OR_DEPARTMENT_OTHER): Payer: Self-pay

## 2024-05-07 ENCOUNTER — Other Ambulatory Visit: Payer: Self-pay

## 2024-05-07 DIAGNOSIS — E559 Vitamin D deficiency, unspecified: Secondary | ICD-10-CM

## 2024-05-14 IMAGING — CR DG HIP (WITH OR WITHOUT PELVIS) 2-3V*R*
2 series · 2 of 2 positions shown · non-contrast
Comparison: None Available.

CLINICAL DATA: Right hip pain.  Negative straight leg raise.

EXAM:
DG HIP (WITH OR WITHOUT PELVIS) 2-3V RIGHT

[t hip ap right]
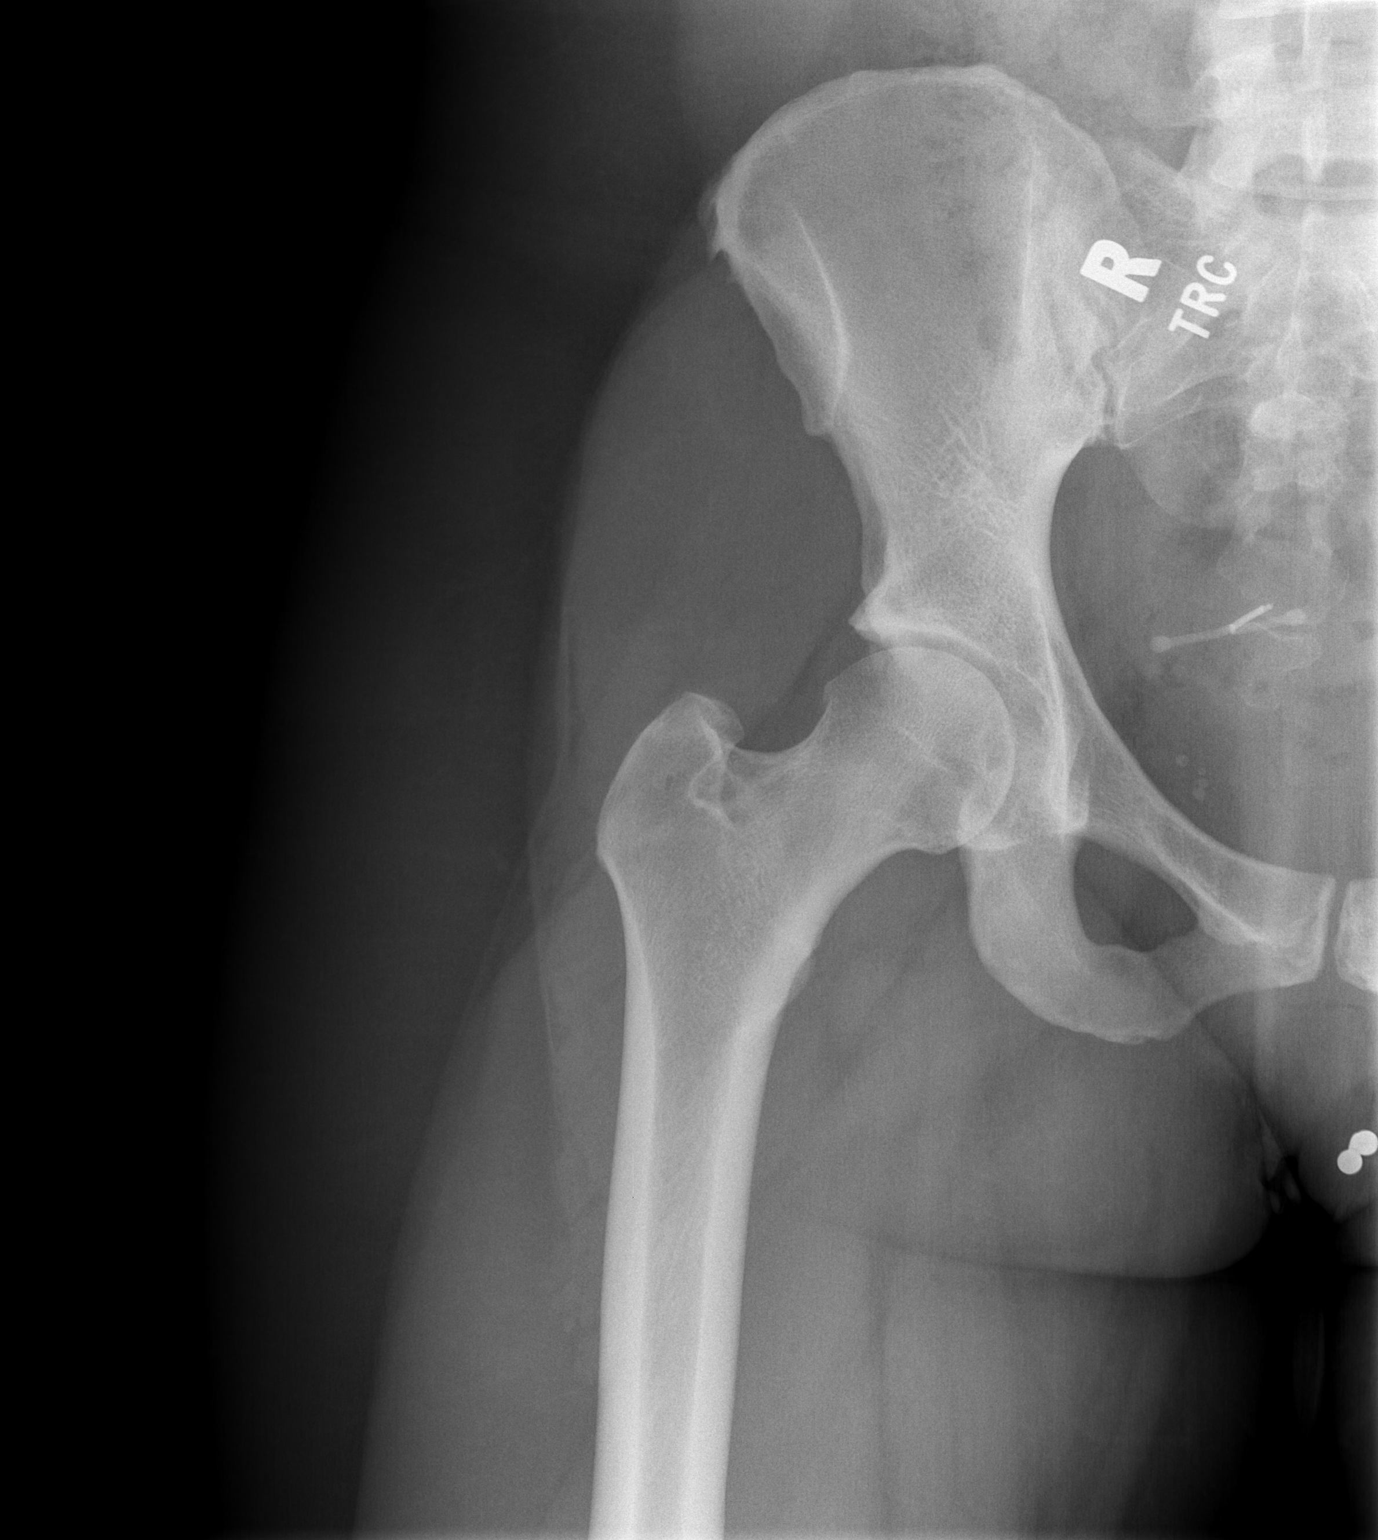

[t hip frog leg right]
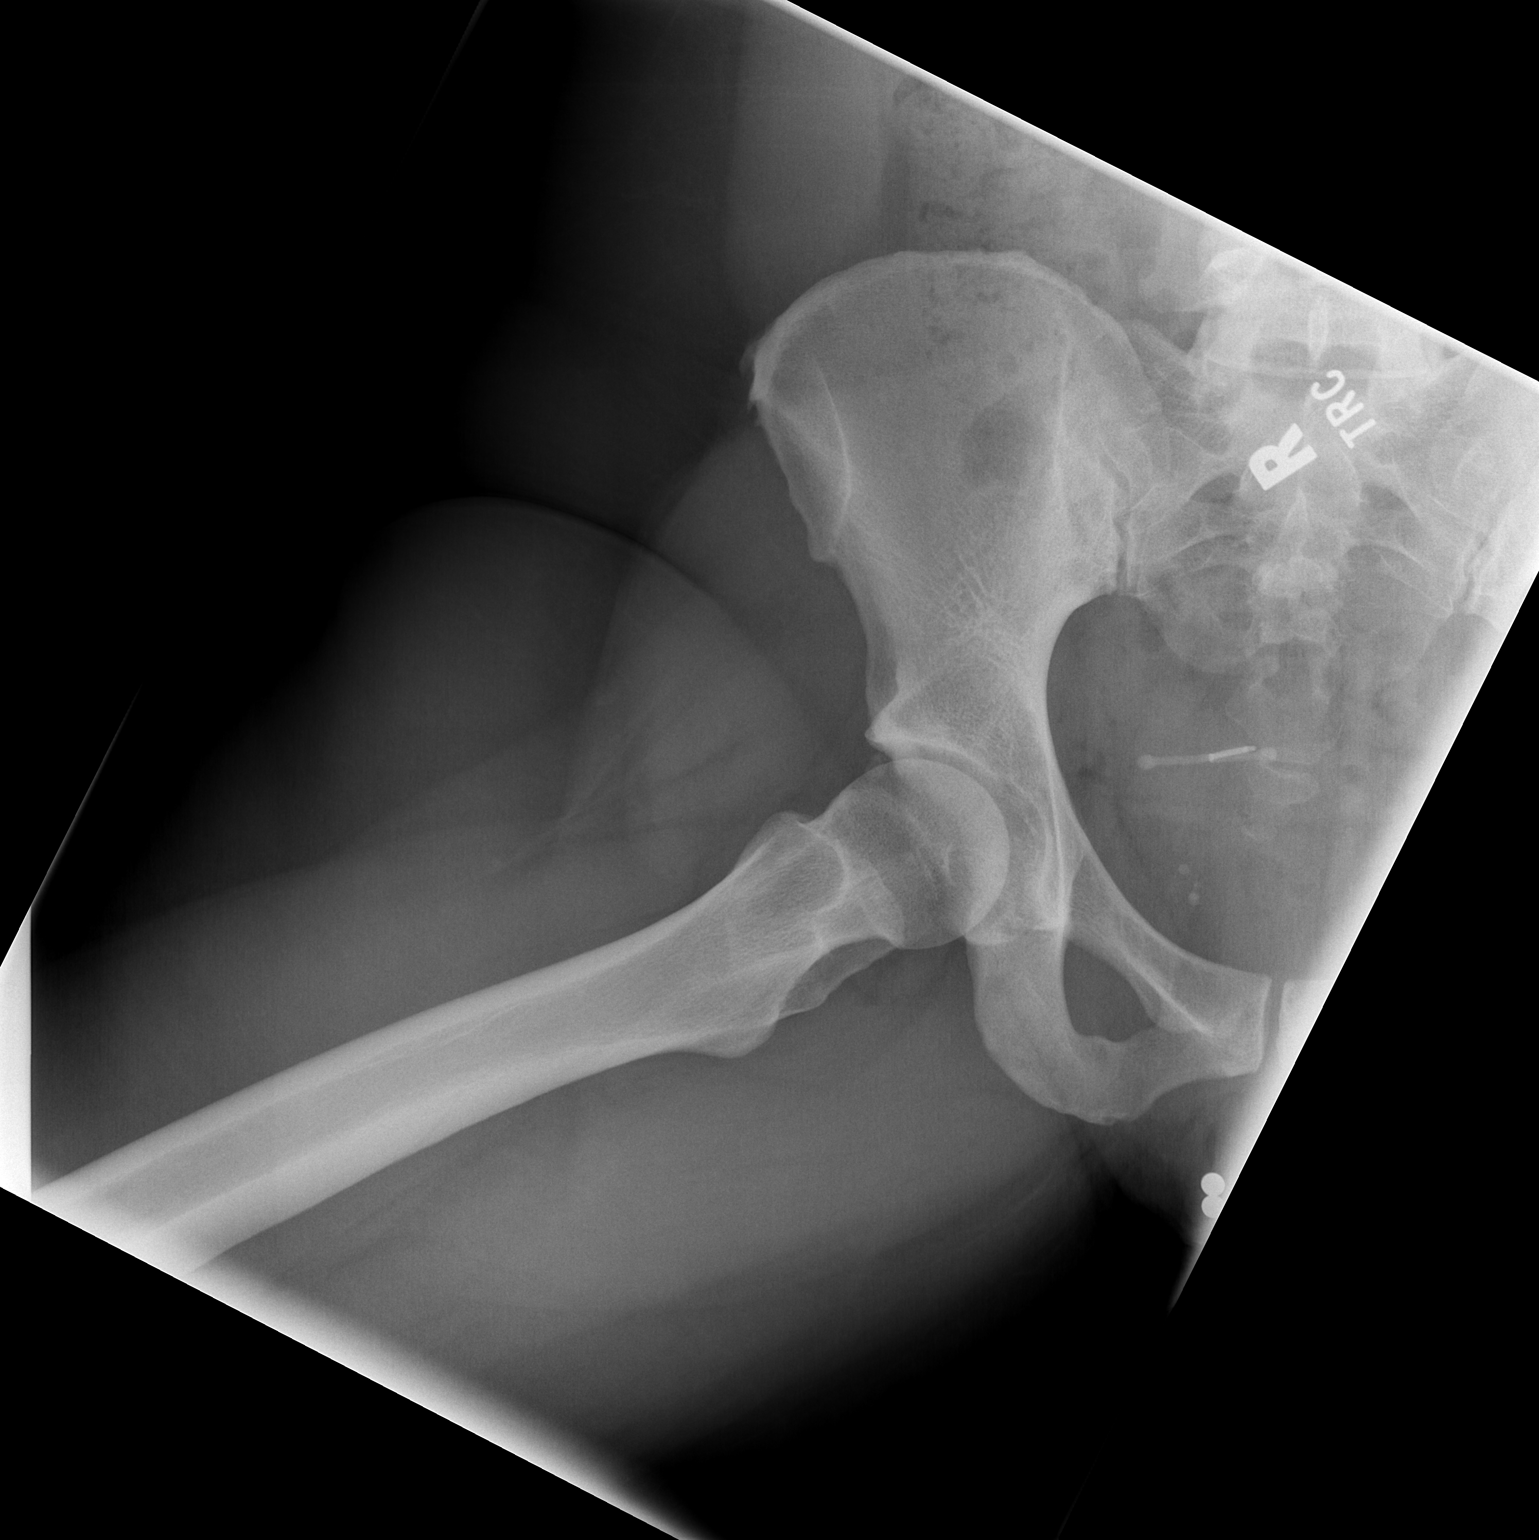

[2 of 2 positions shown; findings below may reference images not displayed]

FINDINGS: There right hip joint space is preserved. No osteophytes or
erosions. There is a small os acetabula. Femoral head is well seated
in the acetabulum. No fracture. No evidence of avascular necrosis,
periosteal reaction, focal bone lesion or bone destruction. The
included right hemipelvis is intact. IUD in place.
IMPRESSION: Small os acetabula can be seen with labral pathology. Otherwise
unremarkable radiographs of the right hip.

## 2024-05-14 IMAGING — CR DG KNEE COMPLETE 4+V*R*
4 series · 4 of 4 positions shown · non-contrast
Comparison: None Available.

CLINICAL DATA: Chronic pain of right knee. No pain elicited during
visit, however it is affecting her driving long distances and with
sexual activity. Pain after sitting for a long time or leg bent.

EXAM:
RIGHT KNEE - COMPLETE 4+ VIEW

[t knee ap right]
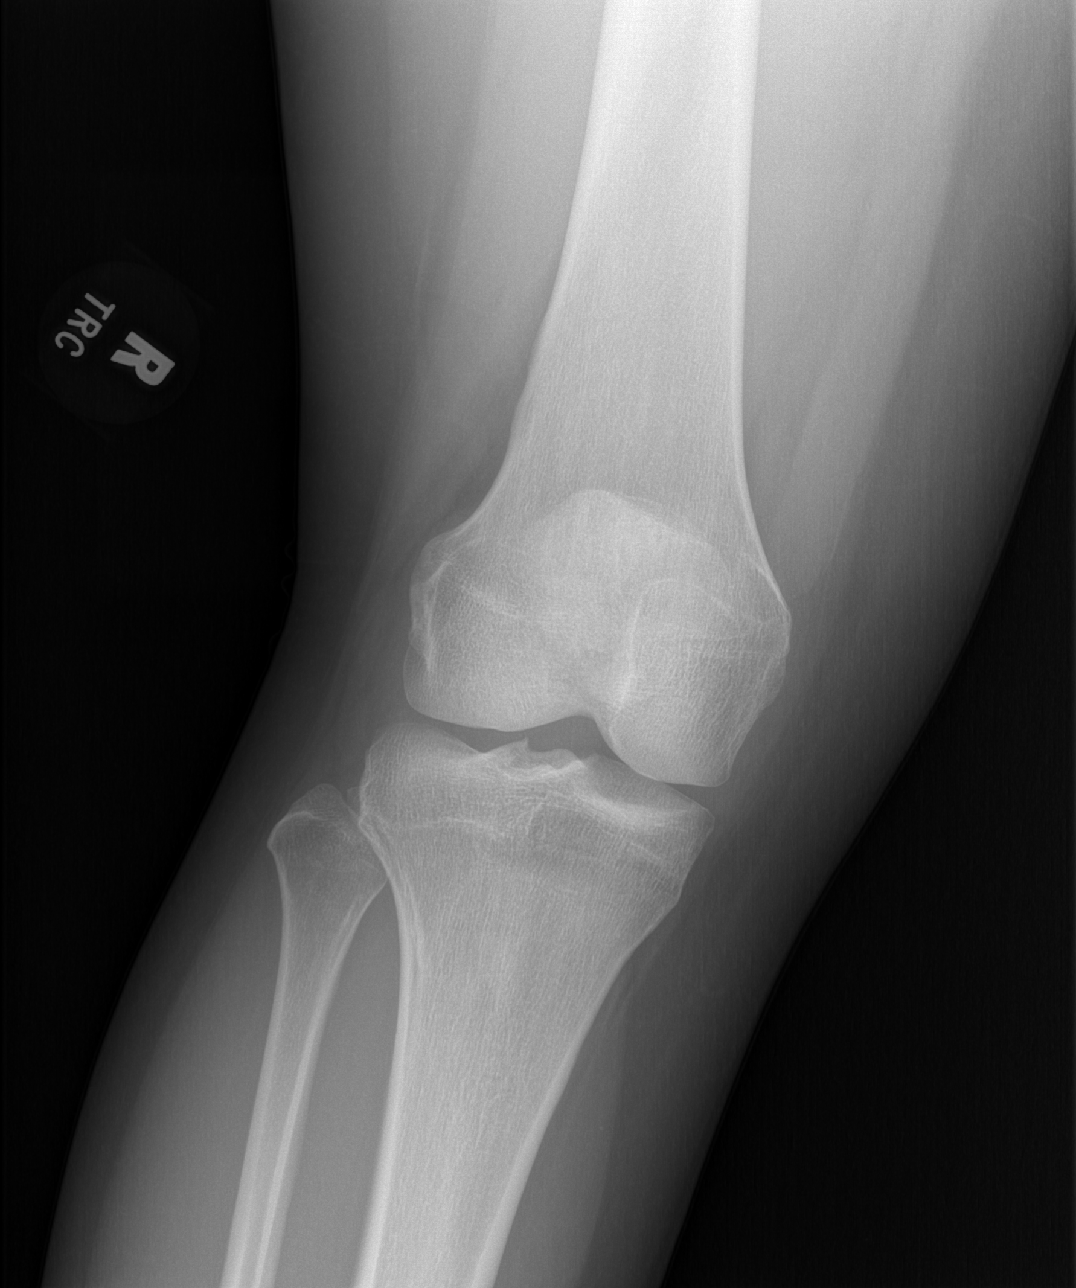

[t knee oblique right (1 of 2)]
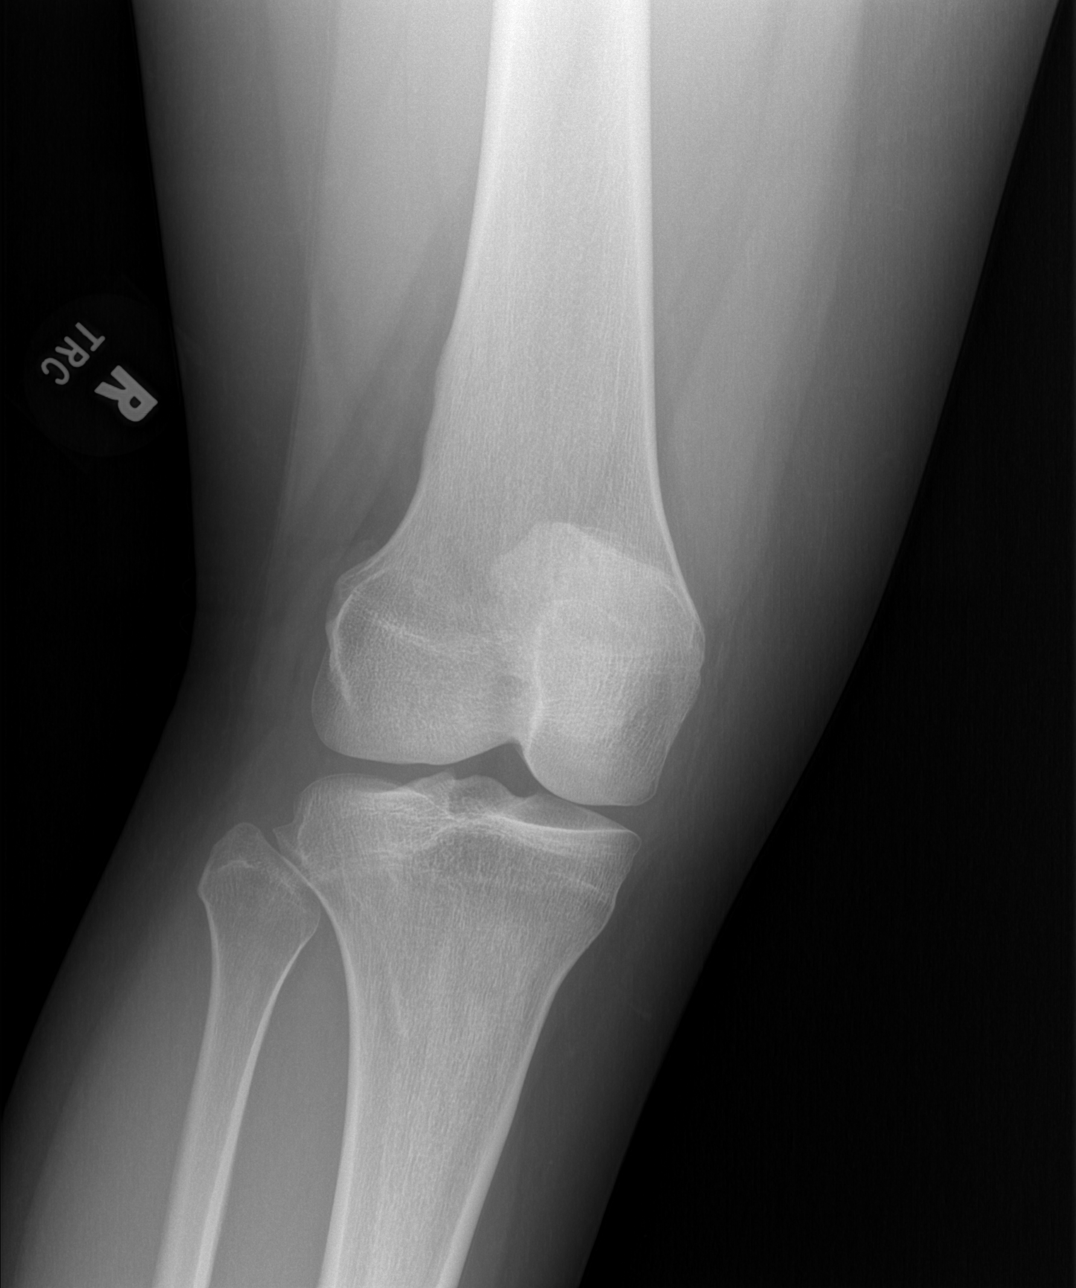

[t knee oblique right (2 of 2)]
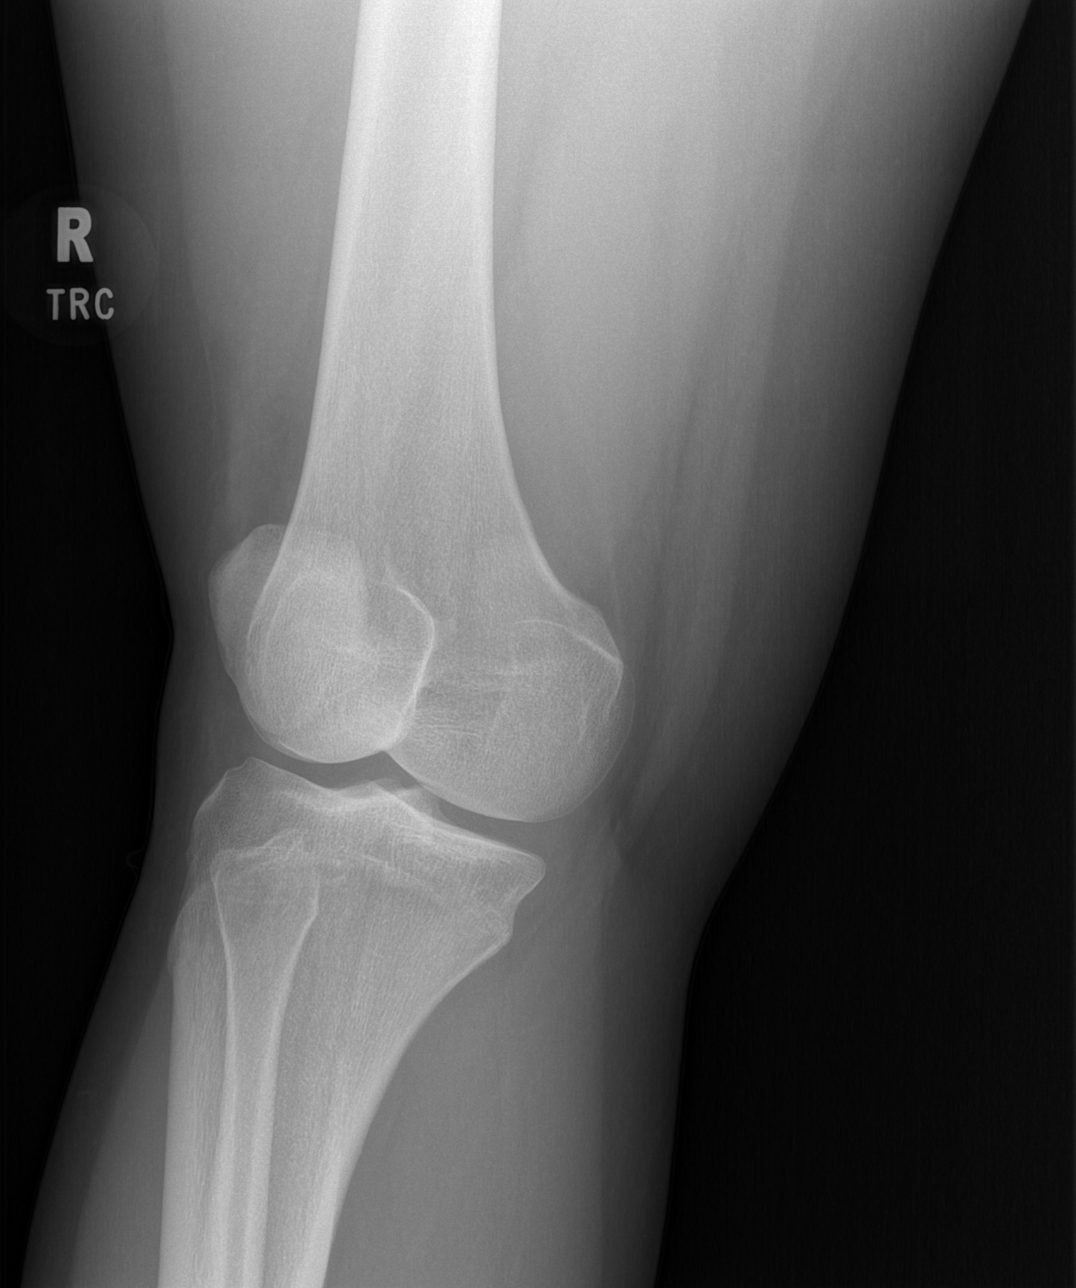

[t knee lat right]
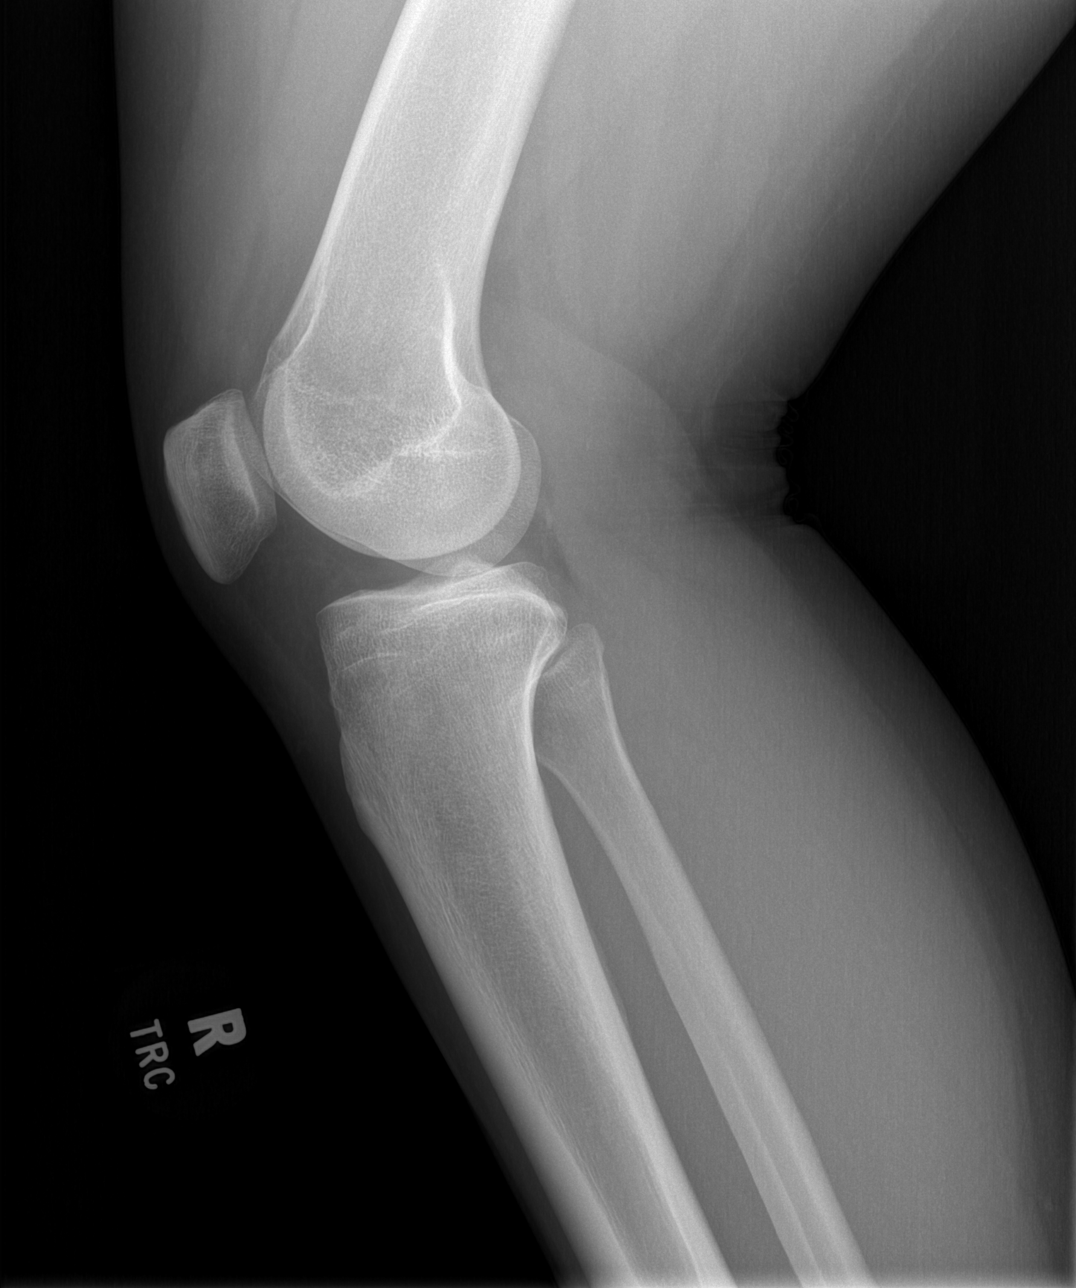

[4 of 4 positions shown; findings below may reference images not displayed]

FINDINGS: Normal alignment and joint spaces. No evidence of fracture,
dislocation, or joint effusion. No evidence of arthropathy or other
focal bone abnormality. Soft tissues are unremarkable.
IMPRESSION: Negative radiographs of the right knee.

## 2024-05-17 ENCOUNTER — Ambulatory Visit (INDEPENDENT_AMBULATORY_CARE_PROVIDER_SITE_OTHER)

## 2024-05-17 DIAGNOSIS — J309 Allergic rhinitis, unspecified: Secondary | ICD-10-CM | POA: Diagnosis not present

## 2024-05-22 ENCOUNTER — Encounter (INDEPENDENT_AMBULATORY_CARE_PROVIDER_SITE_OTHER): Payer: Self-pay | Admitting: Family Medicine

## 2024-05-22 ENCOUNTER — Other Ambulatory Visit (HOSPITAL_BASED_OUTPATIENT_CLINIC_OR_DEPARTMENT_OTHER): Payer: Self-pay

## 2024-05-22 ENCOUNTER — Ambulatory Visit (INDEPENDENT_AMBULATORY_CARE_PROVIDER_SITE_OTHER): Admitting: Family Medicine

## 2024-05-22 ENCOUNTER — Other Ambulatory Visit: Payer: Self-pay

## 2024-05-22 ENCOUNTER — Encounter (INDEPENDENT_AMBULATORY_CARE_PROVIDER_SITE_OTHER): Payer: Self-pay

## 2024-05-22 VITALS — BP 113/75 | HR 73 | Temp 98.8°F | Ht 64.0 in | Wt 184.0 lb

## 2024-05-22 DIAGNOSIS — E88819 Insulin resistance, unspecified: Secondary | ICD-10-CM | POA: Diagnosis not present

## 2024-05-22 DIAGNOSIS — Z6835 Body mass index (BMI) 35.0-35.9, adult: Secondary | ICD-10-CM

## 2024-05-22 DIAGNOSIS — E66812 Obesity, class 2: Secondary | ICD-10-CM | POA: Diagnosis not present

## 2024-05-22 DIAGNOSIS — Z6831 Body mass index (BMI) 31.0-31.9, adult: Secondary | ICD-10-CM | POA: Diagnosis not present

## 2024-05-22 DIAGNOSIS — E7800 Pure hypercholesterolemia, unspecified: Secondary | ICD-10-CM | POA: Diagnosis not present

## 2024-05-22 DIAGNOSIS — E559 Vitamin D deficiency, unspecified: Secondary | ICD-10-CM

## 2024-05-22 DIAGNOSIS — F39 Unspecified mood [affective] disorder: Secondary | ICD-10-CM

## 2024-05-22 DIAGNOSIS — F5089 Other specified eating disorder: Secondary | ICD-10-CM

## 2024-05-22 DIAGNOSIS — Z6832 Body mass index (BMI) 32.0-32.9, adult: Secondary | ICD-10-CM

## 2024-05-22 MED ORDER — VITAMIN D (ERGOCALCIFEROL) 1.25 MG (50000 UNIT) PO CAPS
50000.0000 [IU] | ORAL_CAPSULE | ORAL | 0 refills | Status: DC
  Filled 2024-05-22: qty 4, 28d supply, fill #0

## 2024-05-22 MED ORDER — WEGOVY 0.25 MG/0.5ML ~~LOC~~ SOAJ
0.2500 mg | SUBCUTANEOUS | 0 refills | Status: AC
  Filled 2024-05-22 – 2024-09-26 (×3): qty 2, 28d supply, fill #0

## 2024-05-23 LAB — LIPID PANEL
Chol/HDL Ratio: 3.2 ratio (ref 0.0–4.4)
Cholesterol, Total: 185 mg/dL (ref 100–199)
HDL: 58 mg/dL (ref >39)
LDL Chol Calc (NIH): 114 mg/dL — ABNORMAL HIGH (ref 0–99)
Triglycerides: 72 mg/dL (ref 0–149)
VLDL Cholesterol Cal: 13 mg/dL (ref 5–40)

## 2024-05-23 LAB — INSULIN, RANDOM: INSULIN: 3.3 u[IU]/mL (ref 2.6–24.9)

## 2024-05-24 NOTE — Progress Notes (Signed)
 Megan Norris, D.O.  ABFM, ABOM Specializing in Clinical Bariatric Medicine  Office located at: [address] [place], [place]  [address]      A) FOR THE CHRONIC DISEASE OF OBESITY:  Chief complaint: Obesity Megan Norris is here to discuss her progress with her obesity treatment plan.   History of present illness / Interval history:  Megan Norris is here today for her follow-up office visit.  Since last OV on 03/26/2024, pt is down 9 lbs. Pt states she has been struggling to eat recently, but has not been eating off-plan as much.     04/15/24 14:00 05/22/24 11:00   Body Fat % 36.9 % 34.5 %  Muscle Mass (lbs) 116 lbs 114.6 lbs  Fat Mass (lbs) 71.4 lbs 63.4 lbs  Total Body Water (lbs) 79.4 lbs 75.6 lbs  Visceral Fat Rating  7 6   Counseling done on how various foods will affect these numbers and how to maximize success   Total lbs lost to date: -14 lbs Total Fat Mass in lbs lost to date: -12.4 Total weight loss percentage to date: -7.07 %   Nutrition Therapy She is on  the Category 2 Plan with [employer] and states she is following her eating plan approximately 75 % of the time.   - Tracking Calories/Macros: no -    - Eating More Whole Foods: yes  - Adequate Protein Intake: no -    - Adequate Water Intake: yes  - Skipping Meals: yes: not intentionally trying to not eat.   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 As such, her goal is to continue weight management plan.  She has agreed to: continue current plan   Physical Activity Pt is doing weights and cardio  30-60 minutes 2-3 days per week   Bertina has been advised to work up to 300-450 minutes of moderate intensity aerobic activity a week and strengthening exercises 2-3 times per week for cardiovascular health, weight loss maintenance and preservation of muscle mass.  She has agreed to : Think about enjoyable ways to increase  daily physical activity and overcoming barriers to exercise, Increase physical activity in their day and reduce sedentary time (increase NEAT)., Increase volume of physical activity to a goal of 240 minutes a week, and Combine aerobic and strengthening exercises for efficiency and improved cardiometabolic health.   Behavioral Modifications Evidence-based interventions for health behavior change were utilized today including the discussion of  1) self monitoring techniques:  none 2) problem-solving barriers:  none 3) self care:  none 4) SMART goals for next OV:  Eating more consistently by not skipping meals and hitting protein intake.  Regarding patient's less desirable eating habits and patterns, we employed the technique of small changes.   We discussed the following today: work on meal planning and preparation, work on tracking and journaling calories using tracking application, and discussed pre-packaged healthier meals such as [employer] Natural, Whole 30 chicken meals, Longlife meal prep and [employer] Meals etc Additional resources provided today: None   Medical Interventions/ Pharmacotherapy Previous Bariatric surgery: no Pharmacotherapy for weight loss: She is currently taking Wegovy  0.25 once weekly for medical weight loss.    We discussed various medication options to help Megan Norris with her weight loss efforts and we both agreed to : Continue with current nutritional and behavioral strategies   B) OBESITY RELATED CONDITIONS ADDRESSED  TODAY:  Class 2 severe obesity due to excess calories with serious comorbidity and body mass index (BMI) of 35.0 to 35.9 in adult [facility]) start BMI 33.99 on 12/26/23 BMI 32.0-32.9,adult current 31.57  Insulin  resistance Assessment & Plan  Lab Results  Component Value Date   HGBA1C 5.6 12/26/2023   HGBA1C 5.6 06/06/2022   HGBA1C 5.5 06/01/2021   INSULIN  3.3 05/22/2024   INSULIN  8.7 12/26/2023  Currently on Wegovy  0.25 mg once weekly with good  compliance (refill today). Will stay on this dose for now.  Reports some nausea and intermittent constipation; took Colace to help with the constipation. Reports she has not been feeling hungry. Cravings are well-controlled; reports she doesn't crave sweets like in the past and has been making healthier snacking choices such as protein yogurts. A1c well controlled. Will recheck A1c and insulin  today. No acute concerns today. Encouraged pt to continue healthier snacking options and continue decreasing simple carbohydrates and increase lean protein.     Vitamin D  deficiency Assessment & Plan Lab Results  Component Value Date   VD25OH 59.4 04/15/2024   VD25OH 39.9 12/26/2023   VD25OH 31.7 07/06/2023  Currently on Ergo 50,000 units once weekly with good compliance and tolerance. Vit D levels are at goal today. No acute concerns today. Continue supplementation (refill today) and recheck in 3 months to continue monitoring.   Elevated LDL cholesterol level Assessment & Plan Lab Results  Component Value Date   CHOL 202(H) 12/26/2023   HDL 60 12/26/2023   LDLCALC 123(H) 12/26/2023   TRIG 106 12/26/2023   CHOLHDL 3.4 12/26/2023  Has a history of elevated LDL cholesterol managed with dietary and lifestyle intervention.Cholesterol elevated at 202, optimal 100-199. (Will recheck levels today.) LDL elevated at 123, optimal <100. Has been trying to increase lean protein intake. Pt asx. No acute concerns today. Encouraged to continue a low trans and saturated fats diet. Obtain lipid panel today.     Mood disorder- emotional eating Assessment & Plan Has been doing less emotional eating. Less food noise. Educated pt that skipping meals can contribute to emotional eating and increased snacking. States she has started therapy, which has helped with the emotional eating. Attends weekly sessions through [facility] to help with anxiety and past trauma. Encouraged to eat healthier, increase exercise, and  obtain  7-9 hours of sleep.      Medications Discontinued During This Encounter  Medication Reason   Vitamin D , Ergocalciferol , (DRISDOL ) 1.25 MG (50000 UNIT) CAPS capsule Reorder   semaglutide -weight management (WEGOVY ) 0.25 MG/0.5ML SOAJ SQ injection Reorder     Meds ordered this encounter  Medications   Vitamin D , Ergocalciferol , (DRISDOL ) 1.25 MG (50000 UNIT) CAPS capsule    Sig: Take 1 capsule (50,000 Units total) by mouth every 7 (seven) days.    Dispense:  4 capsule    Refill:  0   semaglutide -weight management (WEGOVY ) 0.25 MG/0.5ML SOAJ SQ injection    Sig: Inject 0.25 mg into the skin once a week.    Dispense:  2 mL    Refill:  0      Follow up:   Return 06/13/2024 3:20 PM.  She was informed of the importance of frequent follow up visits to maximize her success with intensive lifestyle modifications for her multiple health conditions.   Weight Summary and Biometrics    Objective:   PHYSICAL EXAM: Blood pressure 113/75, pulse 73, temperature 98.8 F (37.1 C), height 5' 4 (1.626 m), weight 184 lb (83.5 kg), SpO2 100%.  Body mass index is 31.58 kg/m.  General: she is overweight, cooperative and in no acute distress. PSYCH: Has normal mood, affect and thought process.   HEENT: EOMI, sclerae are anicteric. Lungs: Normal breathing effort, no conversational dyspnea. Extremities: Moves * 4 Neurologic: A and O * 3, good insight  DIAGNOSTIC DATA REVIEWED: BMET    Component Value Date/Time   NA 136 04/15/2024 1509   K 3.7 04/15/2024 1509   CL 99 04/15/2024 1509   CO2 24 04/15/2024 1509   GLUCOSE 84 04/15/2024 1509   GLUCOSE 93 03/04/2018 0616   BUN 13 04/15/2024 1509   CREATININE 0.75 04/15/2024 1509   CALCIUM  9.5 04/15/2024 1509   GFRNONAA >60 03/04/2018 0616   GFRAA >60 03/04/2018 0616   Lab Results  Component Value Date   HGBA1C 5.6 12/26/2023   HGBA1C 5.5 06/01/2021   Lab Results  Component Value Date   INSULIN  3.3 05/22/2024   INSULIN  8.7  12/26/2023   Lab Results  Component Value Date   TSH 0.977 12/26/2023   CBC    Component Value Date/Time   WBC 7.0 12/26/2023 1039   WBC 7.1 03/04/2018 0616   RBC 4.31 12/26/2023 1039   RBC 4.24 03/04/2018 0616   HGB 13.5 12/26/2023 1039   HCT 39.5 12/26/2023 1039   PLT 255 12/26/2023 1039   MCV 92 12/26/2023 1039   MCH 31.3 12/26/2023 1039   MCH 30.9 03/04/2018 0616   MCHC 34.2 12/26/2023 1039   MCHC 33.9 03/04/2018 0616   RDW 12.6 12/26/2023 1039   Iron Studies No results found for: IRON, TIBC, FERRITIN, IRONPCTSAT Lipid Panel     Component Value Date/Time   CHOL 185 05/22/2024 1154   TRIG 72 05/22/2024 1154   HDL 58 05/22/2024 1154   CHOLHDL 3.2 05/22/2024 1154   LDLCALC 114 (H) 05/22/2024 1154   Hepatic Function Panel     Component Value Date/Time   PROT 7.6 12/26/2023 1039   ALBUMIN 4.4 12/26/2023 1039   AST 16 12/26/2023 1039   ALT 11 12/26/2023 1039   ALKPHOS 57 12/26/2023 1039   BILITOT 0.7 12/26/2023 1039      Component Value Date/Time   TSH 0.977 12/26/2023 1039   Nutritional Lab Results  Component Value Date   VD25OH 59.4 04/15/2024   VD25OH 39.9 12/26/2023   VD25OH 31.7 07/06/2023    Attestations:   I, Feliciano Mingle, acting as a Stage manager for [employer], DO., have compiled all relevant documentation for today's office visit on behalf of Megan Jenkins, DO, while in the presence of [employer], DO.   I have reviewed the above documentation for accuracy and completeness, and I agree with the above. Megan JINNY Norris, D.O.  The 21st Century Cures Act was signed into law in 2016 which includes the topic of electronic health records.  This provides immediate access to information in MyChart.  This includes consultation notes, operative notes, office notes, lab results and pathology reports.  If you have any questions about what you read please let us  know at your next visit so we can discuss your concerns and take corrective  action if need be.  We are right here with you.

## 2024-06-13 ENCOUNTER — Other Ambulatory Visit (HOSPITAL_BASED_OUTPATIENT_CLINIC_OR_DEPARTMENT_OTHER): Payer: Self-pay

## 2024-06-13 ENCOUNTER — Ambulatory Visit

## 2024-06-13 ENCOUNTER — Ambulatory Visit (INDEPENDENT_AMBULATORY_CARE_PROVIDER_SITE_OTHER): Admitting: Family Medicine

## 2024-06-13 DIAGNOSIS — J309 Allergic rhinitis, unspecified: Secondary | ICD-10-CM

## 2024-06-20 ENCOUNTER — Ambulatory Visit (INDEPENDENT_AMBULATORY_CARE_PROVIDER_SITE_OTHER): Admitting: Family Medicine

## 2024-06-20 ENCOUNTER — Other Ambulatory Visit (HOSPITAL_BASED_OUTPATIENT_CLINIC_OR_DEPARTMENT_OTHER): Payer: Self-pay

## 2024-06-20 ENCOUNTER — Encounter (INDEPENDENT_AMBULATORY_CARE_PROVIDER_SITE_OTHER): Payer: Self-pay | Admitting: Family Medicine

## 2024-06-20 VITALS — BP 108/71 | HR 71 | Temp 98.5°F | Ht 64.0 in | Wt 185.0 lb

## 2024-06-20 DIAGNOSIS — E88819 Insulin resistance, unspecified: Secondary | ICD-10-CM

## 2024-06-20 DIAGNOSIS — E66812 Obesity, class 2: Secondary | ICD-10-CM | POA: Diagnosis not present

## 2024-06-20 DIAGNOSIS — E7800 Pure hypercholesterolemia, unspecified: Secondary | ICD-10-CM | POA: Diagnosis not present

## 2024-06-20 DIAGNOSIS — E559 Vitamin D deficiency, unspecified: Secondary | ICD-10-CM

## 2024-06-20 DIAGNOSIS — Z6832 Body mass index (BMI) 32.0-32.9, adult: Secondary | ICD-10-CM

## 2024-06-20 DIAGNOSIS — Z6831 Body mass index (BMI) 31.0-31.9, adult: Secondary | ICD-10-CM

## 2024-06-20 MED ORDER — VALACYCLOVIR HCL 1 G PO TABS
1000.0000 mg | ORAL_TABLET | Freq: Every day | ORAL | 3 refills | Status: AC
  Filled 2024-06-20: qty 30, 30d supply, fill #0
  Filled 2024-07-18: qty 30, 30d supply, fill #1
  Filled 2024-08-21: qty 30, 30d supply, fill #2

## 2024-06-20 MED ORDER — METRONIDAZOLE 0.75 % VA GEL
VAGINAL | 0 refills | Status: AC
  Filled 2024-06-20: qty 70, 7d supply, fill #0

## 2024-06-20 MED ORDER — VITAMIN D (ERGOCALCIFEROL) 1.25 MG (50000 UNIT) PO CAPS
50000.0000 [IU] | ORAL_CAPSULE | ORAL | 1 refills | Status: DC
  Filled 2024-06-20: qty 4, 28d supply, fill #0
  Filled 2024-07-22: qty 4, 28d supply, fill #1

## 2024-06-20 NOTE — Progress Notes (Signed)
 [name], D.O.  ABFM, ABOM Specializing in Clinical Bariatric Medicine  Office located at: [address] [place], [place]  [address]      A) FOR THE CHRONIC DISEASE OF OBESITY:  Chief complaint: Obesity Megan Norris is here to discuss her progress with her obesity treatment plan.   History of present illness / Interval history:  Megan Norris is here today for her follow-up office visit.  Since last OV on 05/22/24, pt is up 1 lb. Pt states that she is struggling to sleep due to her personal life and sometimes stays up to speak to her significant other. She currently does not have a routine schedule and is not meal prepping but is trying to make better decisions.      05/22/24 11:00 06/20/24 11:00   Body Fat % 34.5 % 33.9 %  Muscle Mass (lbs) 114.6 lbs 116.2 lbs  Fat Mass (lbs) 63.4 lbs 62.8 lbs  Total Body Water (lbs) 75.6 lbs 77 lbs  Visceral Fat Rating  6 6    Counseling done on how various foods will affect these numbers and how to maximize success   Total lbs lost to date: -13 lbs Total Fat Mass in lbs lost to date: - 13 lbs Total weight loss percentage to date: - 6.57  %    Class 2 severe obesity due to excess calories with serious comorbidity and body mass index (BMI) of 35.0 to 35.9 in adult (HCC) start BMI 33.99 on 12/26/23 BMI 32.0-32.9,adult current 31.74  Nutrition Therapy She is on Cat 2 MP w L options and states she is following her eating plan approximately 75 % of the time.   - Tracking Calories/Macros: no   - Eating More Whole Foods: yes  - Adequate Protein Intake: no   - Adequate Water Intake: no   - Skipping Meals: no   - Sleeping 7-9 Hours/ Night: no    Megan Norris is currently in the action stage of change. As such, her goal is to continue weight management plan.  She has agreed to: continue current plan   Physical Activity Megan Norris is doing cardio or weights 60  minutes 3 to 4 days per week   Megan Norris has been advised to work up to  300-450 minutes of moderate intensity aerobic activity a week and strengthening exercises 2-3 times per week for cardiovascular health, weight loss maintenance and preservation of muscle mass.  She has agreed to : Continue current level of physical activity    Behavioral Modifications Evidence-based interventions for health behavior change were utilized today including the discussion of  1) self monitoring techniques:    - Think positively   2) problem-solving barriers:    - Break meals into smaller meals  3) SMART goals for next OV:   Regarding patient's less desirable eating habits and patterns, we employed the technique of small changes.   We discussed the following today: avoiding skipping meals and work on meal planning and preparation Additional resources provided today: None   Medical Interventions/ Pharmacotherapy Previous Bariatric surgery: n/a Pharmacotherapy for weight loss: She is not currently taking medications  for medical weight loss.    We discussed various medication options to help Megan Norris with her weight loss efforts and we both agreed to : Continue with current nutritional and behavioral strategies   B) OBESITY RELATED CONDITIONS ADDRESSED TODAY:  Insulin  resistance - well controlled Assessment & Plan Lab Results  Component Value Date   HGBA1C 5.6 12/26/2023  HGBA1C 5.6 06/06/2022   HGBA1C 5.5 06/01/2021   INSULIN  3.3 05/22/2024   INSULIN  8.7 12/26/2023    Diet and life style managed. Pt states that her hunger and craings are well controlled. She does have certain days that she does have some cravings but others she does not. Pt was previously on Wegovy  0.25 mg but discontinued the use due to insurance no longer covering the cost. Pts insulin  level is at goal- well controlled. Goal is below 5 and her current level per last labs obtained were 3.3. Continue focusing on prudent nutritional meal plan and decreasing simple carbs and sugar.     Vitamin D   deficiency Assessment & Plan Lab Results  Component Value Date   VD25OH 59.4 04/15/2024   VD25OH 39.9 12/26/2023   VD25OH 31.7 07/06/2023   Taking ERGO 50K units once weekly. Good compliance and tolerance. Vit D levels are at goal. Goal is between 50 and 70. Will recheck in 3 months. No acute concerns today.     Elevated LDL cholesterol level Assessment & Plan Lab Results  Component Value Date   CHOL 185 05/22/2024   HDL 58 05/22/2024   LDLCALC 114 (H) 05/22/2024   TRIG 72 05/22/2024   CHOLHDL 3.2 05/22/2024    Pt is not currently on any medications. HDL levels have decreased from 60 to 58. This could occur due to a decrease in exercise. LDL levels decreased from 876 to 114 and Trig decreased from 106 to 72. Overall pts cholesterol has improved. Continue decreasing foods high in sat and trans fat and increasing exercise as tolerated.      Medications Discontinued During This Encounter  Medication Reason   Vitamin D , Ergocalciferol , (DRISDOL ) 1.25 MG (50000 UNIT) CAPS capsule Reorder     Meds ordered this encounter  Medications   Vitamin D , Ergocalciferol , (DRISDOL ) 1.25 MG (50000 UNIT) CAPS capsule    Sig: Take 1 capsule (50,000 Units total) by mouth every 7 (seven) days.    Dispense:  4 capsule    Refill:  1      Follow up:   Return 07/25/2024 at 11:20 AM  She was informed of the importance of frequent follow up visits to maximize her success with intensive lifestyle modifications for her multiple health conditions.   Weight Summary and Biometrics   Weight Lost Since Last Visit: 0lb  Weight Gained Since Last Visit: 1lb    Vitals Temp: 98.5 F (36.9 C) BP: 108/71 Pulse Rate: 71 SpO2: 97 %   Anthropometric Measurements Height: 5' 4 (1.626 m) Weight: 185 lb (83.9 kg) BMI (Calculated): 31.74 Weight at Last Visit: 184lb Weight Lost Since Last Visit: 0lb Weight Gained Since Last Visit: 1lb Starting Weight: 198lb Total Weight Loss (lbs): 13 lb (5.897  kg) Peak Weight: 211lb   Body Composition  Body Fat %: 33.9 % Fat Mass (lbs): 62.8 lbs Muscle Mass (lbs): 116.2 lbs Total Body Water (lbs): 77 lbs Visceral Fat Rating : 6   Other Clinical Data Fasting: yes Labs: no Today's Visit #: 7 Starting Date: 12/26/23    Objective:   PHYSICAL EXAM: Blood pressure 108/71, pulse 71, temperature 98.5 F (36.9 C), height 5' 4 (1.626 m), weight 185 lb (83.9 kg), SpO2 97%. Body mass index is 31.76 kg/m.  General: she is overweight, cooperative and in no acute distress. PSYCH: Has normal mood, affect and thought process.   HEENT: EOMI, sclerae are anicteric. Lungs: Normal breathing effort, no conversational dyspnea. Extremities: Moves * 4 Neurologic: A and  O * 3, good insight  DIAGNOSTIC DATA REVIEWED: BMET    Component Value Date/Time   NA 136 04/15/2024 1509   K 3.7 04/15/2024 1509   CL 99 04/15/2024 1509   CO2 24 04/15/2024 1509   GLUCOSE 84 04/15/2024 1509   GLUCOSE 93 03/04/2018 0616   BUN 13 04/15/2024 1509   CREATININE 0.75 04/15/2024 1509   CALCIUM  9.5 04/15/2024 1509   GFRNONAA >60 03/04/2018 0616   GFRAA >60 03/04/2018 0616   Lab Results  Component Value Date   HGBA1C 5.6 12/26/2023   HGBA1C 5.5 06/01/2021   Lab Results  Component Value Date   INSULIN  3.3 05/22/2024   INSULIN  8.7 12/26/2023   Lab Results  Component Value Date   TSH 0.977 12/26/2023   CBC    Component Value Date/Time   WBC 7.0 12/26/2023 1039   WBC 7.1 03/04/2018 0616   RBC 4.31 12/26/2023 1039   RBC 4.24 03/04/2018 0616   HGB 13.5 12/26/2023 1039   HCT 39.5 12/26/2023 1039   PLT 255 12/26/2023 1039   MCV 92 12/26/2023 1039   MCH 31.3 12/26/2023 1039   MCH 30.9 03/04/2018 0616   MCHC 34.2 12/26/2023 1039   MCHC 33.9 03/04/2018 0616   RDW 12.6 12/26/2023 1039   Iron Studies No results found for: IRON, TIBC, FERRITIN, IRONPCTSAT Lipid Panel     Component Value Date/Time   CHOL 185 05/22/2024 1154   TRIG 72  05/22/2024 1154   HDL 58 05/22/2024 1154   CHOLHDL 3.2 05/22/2024 1154   LDLCALC 114 (H) 05/22/2024 1154   Hepatic Function Panel     Component Value Date/Time   PROT 7.6 12/26/2023 1039   ALBUMIN 4.4 12/26/2023 1039   AST 16 12/26/2023 1039   ALT 11 12/26/2023 1039   ALKPHOS 57 12/26/2023 1039   BILITOT 0.7 12/26/2023 1039      Component Value Date/Time   TSH 0.977 12/26/2023 1039   Nutritional Lab Results  Component Value Date   VD25OH 59.4 04/15/2024   VD25OH 39.9 12/26/2023   VD25OH 31.7 07/06/2023    Attestations:   I, Sonny Laroche, acting as a stage manager for Barnie Jenkins, DO., have compiled all relevant documentation for today's office visit on behalf of Barnie Jenkins, DO, while in the presence of [employer], DO.   I have reviewed the above documentation for accuracy and completeness, and I agree with the above. Barnie JINNY Jenkins, D.O.  The 21st Century Cures Act was signed into law in 2016 which includes the topic of electronic health records.  This provides immediate access to information in MyChart.  This includes consultation notes, operative notes, office notes, lab results and pathology reports.  If you have any questions about what you read please let us  know at your next visit so we can discuss your concerns and take corrective action if need be.  We are right here with you.

## 2024-06-26 ENCOUNTER — Other Ambulatory Visit (HOSPITAL_BASED_OUTPATIENT_CLINIC_OR_DEPARTMENT_OTHER): Payer: Self-pay

## 2024-06-26 MED ORDER — FLUCONAZOLE 150 MG PO TABS
150.0000 mg | ORAL_TABLET | ORAL | 1 refills | Status: DC
  Filled 2024-06-26: qty 2, 6d supply, fill #0

## 2024-07-06 ENCOUNTER — Other Ambulatory Visit (HOSPITAL_COMMUNITY): Payer: Self-pay

## 2024-07-06 ENCOUNTER — Other Ambulatory Visit (HOSPITAL_BASED_OUTPATIENT_CLINIC_OR_DEPARTMENT_OTHER): Payer: Self-pay

## 2024-07-06 MED ORDER — VALACYCLOVIR HCL 1 G PO TABS
1.0000 g | ORAL_TABLET | Freq: Every day | ORAL | 0 refills | Status: AC
  Filled 2024-07-06 (×2): qty 5, 5d supply, fill #0

## 2024-07-08 ENCOUNTER — Encounter: Payer: Medicaid Other | Admitting: Nurse Practitioner

## 2024-07-09 ENCOUNTER — Encounter: Payer: Medicaid Other | Admitting: Nurse Practitioner

## 2024-07-19 ENCOUNTER — Other Ambulatory Visit (HOSPITAL_BASED_OUTPATIENT_CLINIC_OR_DEPARTMENT_OTHER): Payer: Self-pay

## 2024-07-23 ENCOUNTER — Other Ambulatory Visit (HOSPITAL_BASED_OUTPATIENT_CLINIC_OR_DEPARTMENT_OTHER): Payer: Self-pay

## 2024-07-24 ENCOUNTER — Ambulatory Visit

## 2024-07-24 ENCOUNTER — Other Ambulatory Visit (HOSPITAL_BASED_OUTPATIENT_CLINIC_OR_DEPARTMENT_OTHER): Payer: Self-pay

## 2024-07-24 DIAGNOSIS — J309 Allergic rhinitis, unspecified: Secondary | ICD-10-CM | POA: Diagnosis not present

## 2024-07-24 MED ORDER — FLUCONAZOLE 150 MG PO TABS
150.0000 mg | ORAL_TABLET | ORAL | 0 refills | Status: AC
  Filled 2024-07-24: qty 3, 9d supply, fill #0

## 2024-07-25 ENCOUNTER — Ambulatory Visit (INDEPENDENT_AMBULATORY_CARE_PROVIDER_SITE_OTHER): Admitting: Family Medicine

## 2024-07-25 ENCOUNTER — Encounter (INDEPENDENT_AMBULATORY_CARE_PROVIDER_SITE_OTHER): Payer: Self-pay | Admitting: Family Medicine

## 2024-07-25 ENCOUNTER — Other Ambulatory Visit (HOSPITAL_BASED_OUTPATIENT_CLINIC_OR_DEPARTMENT_OTHER): Payer: Self-pay

## 2024-07-25 VITALS — BP 113/69 | HR 65 | Temp 98.3°F | Ht 64.0 in | Wt 187.0 lb

## 2024-07-25 DIAGNOSIS — E559 Vitamin D deficiency, unspecified: Secondary | ICD-10-CM

## 2024-07-25 DIAGNOSIS — Z6832 Body mass index (BMI) 32.0-32.9, adult: Secondary | ICD-10-CM

## 2024-07-25 DIAGNOSIS — E88819 Insulin resistance, unspecified: Secondary | ICD-10-CM

## 2024-07-25 DIAGNOSIS — Z6835 Body mass index (BMI) 35.0-35.9, adult: Secondary | ICD-10-CM

## 2024-07-25 MED ORDER — VITAMIN D (ERGOCALCIFEROL) 1.25 MG (50000 UNIT) PO CAPS
50000.0000 [IU] | ORAL_CAPSULE | ORAL | 0 refills | Status: DC
  Filled 2024-07-25 – 2024-08-21 (×2): qty 4, 28d supply, fill #0

## 2024-07-25 NOTE — Progress Notes (Signed)
 Megan Norris, D.O.  ABFM, ABOM Specializing in Clinical Bariatric Medicine  Office located at: [address] [place], [place]  [address]      A) FOR THE CHRONIC DISEASE OF OBESITY:  Chief complaint: Obesity Megan Norris is here to discuss her progress with her obesity treatment plan.   History of present illness / Interval history:  Megan Norris is here today for her follow-up office visit.  Since last OV on 06/20/24, pt is up 2 lbs. Patient states that since it is the holidays she is stressed due to her home life. She also reports that she has not been thinking about eating.     06/20/24 11:00 07/25/24 11:00   Body Fat % 33.9 % 34.6 %  Muscle Mass (lbs) 116.2 lbs 116.2 lbs  Fat Mass (lbs) 62.8 lbs 64.8 lbs  Total Body Water (lbs) 77 lbs 76.4 lbs  Visceral Fat Rating  6 6   Counseling done on how various foods will affect these numbers and how to maximize success.   Total lbs lost to date: - 11 lbs Total Fat Mass in lbs lost to date: - 11 lbs Total weight loss percentage to date: - 5.56 %    Class 2 severe obesity due to excess calories with serious comorbidity and body mass index (BMI) of 35.0 to 35.9 in adult (HCC) start BMI 33.99 on 12/26/23 BMI 32.0-32.9,adult current 32.08  Nutrition Therapy She is on Cat 2 MP w L options  and states she is following her eating plan approximately 50 % of the time.   - Tracking Calories/Macros: no   - Eating More Whole Foods: no  - Adequate Protein Intake: no   - Adequate Water Intake: no   - Skipping Meals: yes  - Sleeping 7-9 Hours/ Night: no    Megan Norris is currently in the action stage of change. As such, her goal is to continue weight management plan.  She has agreed to: continue current plan   Physical Activity Megan Norris is weight training and doing HIIT workouts 45-60  minutes 3 days per week   Megan Norris has been advised to work up to 300-450 minutes of moderate intensity aerobic activity a week and strengthening  exercises 2-3 times per week for cardiovascular health, weight loss maintenance and preservation of muscle mass.  She has agreed to : Continue current level of physical activity    Behavioral Modifications Evidence-based interventions for health behavior change were utilized today including the discussion of  1) self care:    - Continue seeing counselor   2) SMART goals for next OV:    - Avoid skipping meals   - Make yourself a priority  Regarding patient's less desirable eating habits and patterns, we employed the technique of small changes.   We discussed the following today: increasing lean protein intake to established goals, avoiding skipping meals, work on meal planning and preparation, and discussed pre-packaged healthier meals such as [employer], Whole 30 chicken meals, [employer] meal prep and [employer] Meals etc Additional resources provided today: Handout on slow cooker recipes   Medical Interventions/ Pharmacotherapy Previous Bariatric surgery: n/a Pharmacotherapy for weight loss: She is not currently taking medications  for medical weight loss.    We discussed various medication options to help Megan Norris with her weight loss efforts and we both agreed to : Continue with current nutritional and behavioral strategies   B) OBESITY RELATED CONDITIONS ADDRESSED TODAY:  Insulin  resistance- well controlled Assessment & Plan Lab  Results  Component Value Date   HGBA1C 5.6 12/26/2023   HGBA1C 5.6 06/06/2022   HGBA1C 5.5 06/01/2021   INSULIN  3.3 05/22/2024   INSULIN  8.7 12/26/2023    Diet and lifestyle controlled. Patient states that her hunger and cravings have been better controlled. She endorses seeing a counselor once a week. Since her stress levels have increased she is struggling to eat. She reports eating pizza last night because it was what was available. Stressed the importance of eating all her food and lean proteins. Continue following prudent meal plan and  decreasing simple carbs and sugars.     Vitamin D  deficiency Assessment & Plan Lab Results  Component Value Date   VD25OH 59.4 04/15/2024   VD25OH 39.9 12/26/2023   VD25OH 31.7 07/06/2023   Taking ERGO 50K units once weekly. States that she has not been consistent with it due to increased stress. Stressed the importance of having and keeping at goal Vit D levels. Continue with supplementation, will refill today.       Medications Discontinued During This Encounter  Medication Reason   Vitamin D , Ergocalciferol , (DRISDOL ) 1.25 MG (50000 UNIT) CAPS capsule Reorder     Meds ordered this encounter  Medications   Vitamin D , Ergocalciferol , (DRISDOL ) 1.25 MG (50000 UNIT) CAPS capsule    Sig: Take 1 capsule (50,000 Units total) by mouth every 7 (seven) days.    Dispense:  4 capsule    Refill:  0      Follow up:   Return 08/29/2024 at 11:20 AM  She was informed of the importance of frequent follow up visits to maximize her success with intensive lifestyle modifications for her multiple health conditions.   Weight Summary and Biometrics   Weight Lost Since Last Visit: 0lb  Weight Gained Since Last Visit: 2lb  Vitals Temp: 98.3 F (36.8 C) BP: 113/69 Pulse Rate: 65 SpO2: 99 %   Anthropometric Measurements Height: 5' 4 (1.626 m) Weight: 187 lb (84.8 kg) BMI (Calculated): 32.08 Weight at Last Visit: 185lb Weight Lost Since Last Visit: 0lb Weight Gained Since Last Visit: 2lb Starting Weight: 198lb Total Weight Loss (lbs): 11 lb (4.99 kg) Peak Weight: 211lb   Body Composition  Body Fat %: 34.6 % Fat Mass (lbs): 64.8 lbs Muscle Mass (lbs): 116.2 lbs Total Body Water (lbs): 76.4 lbs Visceral Fat Rating : 6   Other Clinical Data Fasting: No Labs: No Today's Visit #: 8 Starting Date: 12/26/23    Objective:   PHYSICAL EXAM: Blood pressure 113/69, pulse 65, temperature 98.3 F (36.8 C), height 5' 4 (1.626 m), weight 187 lb (84.8 kg), SpO2 99%. Body mass  index is 32.1 kg/m.  General: she is overweight, cooperative and in no acute distress. PSYCH: Has normal mood, affect and thought process.   HEENT: EOMI, sclerae are anicteric. Lungs: Normal breathing effort, no conversational dyspnea. Extremities: Moves * 4 Neurologic: A and O * 3, good insight  DIAGNOSTIC DATA REVIEWED: BMET    Component Value Date/Time   NA 136 04/15/2024 1509   K 3.7 04/15/2024 1509   CL 99 04/15/2024 1509   CO2 24 04/15/2024 1509   GLUCOSE 84 04/15/2024 1509   GLUCOSE 93 03/04/2018 0616   BUN 13 04/15/2024 1509   CREATININE 0.75 04/15/2024 1509   CALCIUM  9.5 04/15/2024 1509   GFRNONAA >60 03/04/2018 0616   GFRAA >60 03/04/2018 0616   Lab Results  Component Value Date   HGBA1C 5.6 12/26/2023   HGBA1C 5.5 06/01/2021   Lab  Results  Component Value Date   INSULIN  3.3 05/22/2024   INSULIN  8.7 12/26/2023   Lab Results  Component Value Date   TSH 0.977 12/26/2023   CBC    Component Value Date/Time   WBC 7.0 12/26/2023 1039   WBC 7.1 03/04/2018 0616   RBC 4.31 12/26/2023 1039   RBC 4.24 03/04/2018 0616   HGB 13.5 12/26/2023 1039   HCT 39.5 12/26/2023 1039   PLT 255 12/26/2023 1039   MCV 92 12/26/2023 1039   MCH 31.3 12/26/2023 1039   MCH 30.9 03/04/2018 0616   MCHC 34.2 12/26/2023 1039   MCHC 33.9 03/04/2018 0616   RDW 12.6 12/26/2023 1039   Iron Studies No results found for: IRON, TIBC, FERRITIN, IRONPCTSAT Lipid Panel     Component Value Date/Time   CHOL 185 05/22/2024 1154   TRIG 72 05/22/2024 1154   HDL 58 05/22/2024 1154   CHOLHDL 3.2 05/22/2024 1154   LDLCALC 114 (H) 05/22/2024 1154   Hepatic Function Panel     Component Value Date/Time   PROT 7.6 12/26/2023 1039   ALBUMIN 4.4 12/26/2023 1039   AST 16 12/26/2023 1039   ALT 11 12/26/2023 1039   ALKPHOS 57 12/26/2023 1039   BILITOT 0.7 12/26/2023 1039      Component Value Date/Time   TSH 0.977 12/26/2023 1039   Nutritional Lab Results  Component Value Date    VD25OH 59.4 04/15/2024   VD25OH 39.9 12/26/2023   VD25OH 31.7 07/06/2023    Attestations:   I, Sonny Laroche, acting as a stage manager for Megan Jenkins, DO., have compiled all relevant documentation for today's office visit on behalf of Megan Jenkins, DO, while in the presence of [employer], DO.   I have reviewed the above documentation for accuracy and completeness, and I agree with the above. Megan Megan Norris, D.O.  The 21st Century Cures Act was signed into law in 2016 which includes the topic of electronic health records.  This provides immediate access to information in MyChart.  This includes consultation notes, operative notes, office notes, lab results and pathology reports.  If you have any questions about what you read please let us  know at your next visit so we can discuss your concerns and take corrective action if need be.  We are right here with you.

## 2024-07-30 ENCOUNTER — Other Ambulatory Visit (HOSPITAL_BASED_OUTPATIENT_CLINIC_OR_DEPARTMENT_OTHER): Payer: Self-pay

## 2024-07-30 MED ORDER — DOXYCYCLINE HYCLATE 100 MG PO CAPS
100.0000 mg | ORAL_CAPSULE | Freq: Two times a day (BID) | ORAL | 0 refills | Status: DC
  Filled 2024-07-30: qty 14, 7d supply, fill #0

## 2024-08-01 DIAGNOSIS — J301 Allergic rhinitis due to pollen: Secondary | ICD-10-CM | POA: Diagnosis not present

## 2024-08-01 NOTE — Progress Notes (Signed)
 VIALS MADE ON 08/01/24

## 2024-08-02 DIAGNOSIS — J3089 Other allergic rhinitis: Secondary | ICD-10-CM | POA: Diagnosis not present

## 2024-08-02 DIAGNOSIS — J302 Other seasonal allergic rhinitis: Secondary | ICD-10-CM | POA: Diagnosis not present

## 2024-08-09 ENCOUNTER — Ambulatory Visit (INDEPENDENT_AMBULATORY_CARE_PROVIDER_SITE_OTHER)

## 2024-08-09 DIAGNOSIS — J309 Allergic rhinitis, unspecified: Secondary | ICD-10-CM

## 2024-08-21 ENCOUNTER — Ambulatory Visit

## 2024-08-21 ENCOUNTER — Other Ambulatory Visit (HOSPITAL_BASED_OUTPATIENT_CLINIC_OR_DEPARTMENT_OTHER): Payer: Self-pay

## 2024-08-21 DIAGNOSIS — J309 Allergic rhinitis, unspecified: Secondary | ICD-10-CM

## 2024-08-21 MED ORDER — AMPHETAMINE-DEXTROAMPHET ER 30 MG PO CP24
30.0000 mg | ORAL_CAPSULE | Freq: Every day | ORAL | 0 refills | Status: AC
  Filled 2024-08-21: qty 30, 30d supply, fill #0

## 2024-08-21 MED ORDER — AMPHETAMINE-DEXTROAMPHETAMINE 10 MG PO TABS
10.0000 mg | ORAL_TABLET | Freq: Every day | ORAL | 0 refills | Status: AC
  Filled 2024-08-21: qty 30, 30d supply, fill #0

## 2024-08-23 ENCOUNTER — Other Ambulatory Visit (HOSPITAL_BASED_OUTPATIENT_CLINIC_OR_DEPARTMENT_OTHER): Payer: Self-pay

## 2024-08-23 MED ORDER — AMPHETAMINE-DEXTROAMPHET ER 30 MG PO CP24: 30.0000 mg | ORAL_CAPSULE | Freq: Every day | ORAL | 0 refills | Status: AC

## 2024-08-23 MED ORDER — AMPHETAMINE-DEXTROAMPHETAMINE 10 MG PO TABS: 10.0000 mg | ORAL_TABLET | Freq: Every day | ORAL | 0 refills | Status: AC

## 2024-08-26 ENCOUNTER — Other Ambulatory Visit: Payer: Self-pay

## 2024-08-29 ENCOUNTER — Ambulatory Visit (INDEPENDENT_AMBULATORY_CARE_PROVIDER_SITE_OTHER): Admitting: Family Medicine

## 2024-08-30 ENCOUNTER — Other Ambulatory Visit (HOSPITAL_BASED_OUTPATIENT_CLINIC_OR_DEPARTMENT_OTHER): Payer: Self-pay

## 2024-09-05 ENCOUNTER — Other Ambulatory Visit (HOSPITAL_BASED_OUTPATIENT_CLINIC_OR_DEPARTMENT_OTHER): Payer: Self-pay

## 2024-09-05 ENCOUNTER — Encounter (INDEPENDENT_AMBULATORY_CARE_PROVIDER_SITE_OTHER): Payer: Self-pay | Admitting: Family Medicine

## 2024-09-05 ENCOUNTER — Ambulatory Visit (INDEPENDENT_AMBULATORY_CARE_PROVIDER_SITE_OTHER): Admitting: Family Medicine

## 2024-09-05 VITALS — BP 105/70 | HR 63 | Temp 97.6°F | Ht 64.0 in | Wt 197.0 lb

## 2024-09-05 DIAGNOSIS — E66812 Obesity, class 2: Secondary | ICD-10-CM

## 2024-09-05 DIAGNOSIS — Z6832 Body mass index (BMI) 32.0-32.9, adult: Secondary | ICD-10-CM

## 2024-09-05 DIAGNOSIS — Z6833 Body mass index (BMI) 33.0-33.9, adult: Secondary | ICD-10-CM | POA: Diagnosis not present

## 2024-09-05 DIAGNOSIS — E88819 Insulin resistance, unspecified: Secondary | ICD-10-CM

## 2024-09-05 DIAGNOSIS — Z6835 Body mass index (BMI) 35.0-35.9, adult: Secondary | ICD-10-CM

## 2024-09-05 DIAGNOSIS — E559 Vitamin D deficiency, unspecified: Secondary | ICD-10-CM | POA: Diagnosis not present

## 2024-09-05 MED ORDER — METFORMIN HCL 500 MG PO TABS
500.0000 mg | ORAL_TABLET | Freq: Two times a day (BID) | ORAL | 1 refills | Status: AC
  Filled 2024-09-05: qty 60, 30d supply, fill #0

## 2024-09-05 MED ORDER — VITAMIN D (ERGOCALCIFEROL) 1.25 MG (50000 UNIT) PO CAPS
50000.0000 [IU] | ORAL_CAPSULE | ORAL | 0 refills | Status: AC
  Filled 2024-09-05: qty 12, 84d supply, fill #0

## 2024-09-05 NOTE — Progress Notes (Signed)
 "  Megan Norris, D.O.  ABFM, ABOM Specializing in Clinical Bariatric Medicine  Office located at: [address] [place], [place]  [address]    Medications Discontinued During This Encounter  Medication Reason   Vitamin D , Ergocalciferol , (DRISDOL ) 1.25 MG (50000 UNIT) CAPS capsule Reorder     Meds ordered this encounter  Medications   Vitamin D , Ergocalciferol , (DRISDOL ) 1.25 MG (50000 UNIT) CAPS capsule    Sig: Take 1 capsule (50,000 Units total) by mouth every 7 (seven) days.    Dispense:  12 capsule    Refill:  0   metFORMIN  (GLUCOPHAGE ) 500 MG tablet    Sig: Take 1 tablet (500 mg total) by mouth 2 (two) times daily with a meal.    Dispense:  60 tablet    Refill:  1     A) FOR THE CHRONIC DISEASE OF OBESITY:  Chief complaint: Obesity Megan Norris is here to discuss her progress with her obesity treatment plan.   History of present illness / Interval history:  Megan Norris is here today for her follow-up office visit.  Since last OV on 07/25/24, pt is up 10 lbs. Patient states that with her current work schedule she is very busy and is just trying to do her best.    07/25/24 11:00 09/05/24 14:00   Body Fat % 34.6 % 37.7 %  Muscle Mass (lbs) 116.2 lbs 117 lbs  Fat Mass (lbs) 64.8 lbs 74.4 lbs  Total Body Water (lbs) 76.4 lbs 79.8 lbs  Visceral Fat Rating  6 8   Counseling done on how various foods will affect these numbers and how to maximize success.  Total lbs lost to date: - 1 lbs Total Fat Mass in lbs lost to date: - 1.4 lbs Total weight loss percentage to date: - 0.51 %    Class 2 severe obesity (BMI) of 35.0 to 35.9 in adult (HCC) start BMI 33.99 BMI 32.0-32.9,adult current 33.8 Nutrition Therapy She is on Cat 2 MP w L options and states she is following her eating plan approximately 50 % of the time.   - Tracking Calories/Macros: no - states she will never track  - Eating More Whole Foods: yes  - Adequate Protein Intake: no  - Adequate Water  Intake: no   - Skipping Meals: yes  - Sleeping 7-9 Hours/ Night: no   [name] is currently in the action stage of change. As such, her goal is to continue weight management plan.  She has agreed to: continue current plan   Physical Activity [name] is doing weights or cardio 40-60  minutes 3 days per week   [name] has been advised to work up to 300-450 minutes of moderate intensity aerobic activity a week and strengthening exercises 2-3 times per week for cardiovascular health, weight loss maintenance and preservation of muscle mass.  She has agreed to : Continue to gradually increase the amount and intensity of exercise routine   Behavioral Modifications Evidence-based interventions for health behavior change were utilized today including the discussion of   1) problem-solving barriers:    - Meal prep  2) self care:    - Continue seeing counselor   3) SMART goals for next OV:    - Eat more ... Skip less meals   Regarding patient's less desirable eating habits and patterns, we employed the technique of small changes.   We discussed the following today: increasing lean protein intake to established goals, avoiding skipping meals, keeping healthy foods  at home, planning for success, and discussed pre-packaged healthier meals such as [employer] Natural, Whole 30 chicken meals, Longlife meal prep and Factor Meals etc Additional resources provided today: Handout on risks/ benefits of metformin  and associated Myths of use and Handout on benefits of metformin  for weight loss   Medical Interventions/ Pharmacotherapy Previous Bariatric surgery: n/a Pharmacotherapy for weight loss: She is not currently taking medications  for medical weight loss.    We discussed various medication options to help [name] with her weight loss efforts and we both agreed to : Continue with current nutritional and behavioral strategies   B) OBESITY RELATED CONDITIONS ADDRESSED TODAY:  Insulin   resistance- well controlled Assessment & Plan Lab Results  Component Value Date   HGBA1C 5.6 12/26/2023   HGBA1C 5.6 06/06/2022   HGBA1C 5.5 06/01/2021   INSULIN  3.3 05/22/2024   INSULIN  8.7 12/26/2023    Diet and life style controlled. Patient reports that yesterday morning she had a Location Manager breakfast sandwich, for lunch she had a grilled chicken stir fry from her cafeteria at work. For dinner she had some type of frozen meal. On typical days she will skip breakfast and eat a chicken quesadilla or chicken [place] wrap and skip dinner or get [employer]. Stressed to patient the importance of eating lean proteins and decreasing simple carbs and sugars. Explained to patient the benefits of Metformin  and risks. Gave patient a handout of risks and benefits an myths on Metformin . Mutually agreed to Begin Metformin  half a tab twice daily and if tolerating well increase to 1 full tablet twice daily. Explained to patient how if she eats off plan she will experience possible GI upset. Continue following nutritional meal plan and decreasing simple carbs.    Vitamin D  deficiency Assessment & Plan Lab Results  Component Value Date   VD25OH 59.4 04/15/2024   VD25OH 39.9 12/26/2023   VD25OH 31.7 07/06/2023   Taking ERGO 50K units once weekly with reported good compliance and tolerance. Patient states that she has had no side effects from the supplements. She has no issues remembering to take supplements. Will refill today. Will obtain labs at next OV.     Medications Discontinued During This Encounter  Medication Reason   Vitamin D , Ergocalciferol , (DRISDOL ) 1.25 MG (50000 UNIT) CAPS capsule Reorder     Meds ordered this encounter  Medications   Vitamin D , Ergocalciferol , (DRISDOL ) 1.25 MG (50000 UNIT) CAPS capsule    Sig: Take 1 capsule (50,000 Units total) by mouth every 7 (seven) days.    Dispense:  12 capsule    Refill:  0   metFORMIN  (GLUCOPHAGE ) 500 MG tablet    Sig: Take 1 tablet (500  mg total) by mouth 2 (two) times daily with a meal.    Dispense:  60 tablet    Refill:  1      Follow up:   Return 09/30/2024 at 3:40 PM  She was informed of the importance of frequent follow up visits to maximize her success with intensive lifestyle modifications for her multiple health conditions.   Weight Summary and Biometrics   Weight Lost Since Last Visit: 0lb  Weight Gained Since Last Visit: 10lb   Vitals Temp: 97.6 F (36.4 C) BP: 105/70 Pulse Rate: 63 SpO2: 98 %   Anthropometric Measurements Height: 5' 4 (1.626 m) Weight: 197 lb (89.4 kg) BMI (Calculated): 33.8 Weight at Last Visit: 187lb Weight Lost Since Last Visit: 0lb Weight Gained Since Last Visit: 10lb Starting Weight: 198lb  Total Weight Loss (lbs): 1 lb (0.454 kg) Peak Weight: 211lb   Body Composition  Body Fat %: 37.7 % Fat Mass (lbs): 74.4 lbs Muscle Mass (lbs): 117 lbs Total Body Water (lbs): 79.8 lbs Visceral Fat Rating : 8   Other Clinical Data Fasting: no Labs: no Today's Visit #: 9 Starting Date: 12/26/23    Objective:   PHYSICAL EXAM: Blood pressure 105/70, pulse 63, temperature 97.6 F (36.4 C), height 5' 4 (1.626 m), weight 197 lb (89.4 kg), SpO2 98%. Body mass index is 33.81 kg/m.  General: she is overweight, cooperative and in no acute distress. PSYCH: Has normal mood, affect and thought process.   HEENT: EOMI, sclerae are anicteric. Lungs: Normal breathing effort, no conversational dyspnea. Extremities: Moves * 4 Neurologic: A and O * 3, good insight  DIAGNOSTIC DATA REVIEWED: BMET    Component Value Date/Time   NA 136 04/15/2024 1509   K 3.7 04/15/2024 1509   CL 99 04/15/2024 1509   CO2 24 04/15/2024 1509   GLUCOSE 84 04/15/2024 1509   GLUCOSE 93 03/04/2018 0616   BUN 13 04/15/2024 1509   CREATININE 0.75 04/15/2024 1509   CALCIUM  9.5 04/15/2024 1509   GFRNONAA >60 03/04/2018 0616   GFRAA >60 03/04/2018 0616   Lab Results  Component Value Date   HGBA1C  5.6 12/26/2023   HGBA1C 5.5 06/01/2021   Lab Results  Component Value Date   INSULIN  3.3 05/22/2024   INSULIN  8.7 12/26/2023   Lab Results  Component Value Date   TSH 0.977 12/26/2023   CBC    Component Value Date/Time   WBC 7.0 12/26/2023 1039   WBC 7.1 03/04/2018 0616   RBC 4.31 12/26/2023 1039   RBC 4.24 03/04/2018 0616   HGB 13.5 12/26/2023 1039   HCT 39.5 12/26/2023 1039   PLT 255 12/26/2023 1039   MCV 92 12/26/2023 1039   MCH 31.3 12/26/2023 1039   MCH 30.9 03/04/2018 0616   MCHC 34.2 12/26/2023 1039   MCHC 33.9 03/04/2018 0616   RDW 12.6 12/26/2023 1039   Iron Studies No results found for: IRON, TIBC, FERRITIN, IRONPCTSAT Lipid Panel     Component Value Date/Time   CHOL 185 05/22/2024 1154   TRIG 72 05/22/2024 1154   HDL 58 05/22/2024 1154   CHOLHDL 3.2 05/22/2024 1154   LDLCALC 114 (H) 05/22/2024 1154   Hepatic Function Panel     Component Value Date/Time   PROT 7.6 12/26/2023 1039   ALBUMIN 4.4 12/26/2023 1039   AST 16 12/26/2023 1039   ALT 11 12/26/2023 1039   ALKPHOS 57 12/26/2023 1039   BILITOT 0.7 12/26/2023 1039      Component Value Date/Time   TSH 0.977 12/26/2023 1039   Nutritional Lab Results  Component Value Date   VD25OH 59.4 04/15/2024   VD25OH 39.9 12/26/2023   VD25OH 31.7 07/06/2023    Attestations:   I, Sonny Laroche, acting as a stage manager for Megan Jenkins, DO., have compiled all relevant documentation for today's office visit on behalf of Megan Jenkins, DO, while in the presence of [employer], DO.   I have reviewed the above documentation for accuracy and completeness, and I agree with the above. Megan Megan Norris, D.O.  The 21st Century Cures Act was signed into law in 2016 which includes the topic of electronic health records.  This provides immediate access to information in MyChart.  This includes consultation notes, operative notes, office notes, lab results and pathology reports.  If you  have  any questions about what you read please let us  know at your next visit so we can discuss your concerns and take corrective action if need be.  We are right here with you.  "

## 2024-09-12 ENCOUNTER — Ambulatory Visit

## 2024-09-12 DIAGNOSIS — J302 Other seasonal allergic rhinitis: Secondary | ICD-10-CM | POA: Diagnosis not present

## 2024-09-20 ENCOUNTER — Encounter (INDEPENDENT_AMBULATORY_CARE_PROVIDER_SITE_OTHER): Payer: Self-pay

## 2024-09-20 ENCOUNTER — Telehealth (INDEPENDENT_AMBULATORY_CARE_PROVIDER_SITE_OTHER): Payer: Self-pay

## 2024-09-20 DIAGNOSIS — K219 Gastro-esophageal reflux disease without esophagitis: Secondary | ICD-10-CM | POA: Insufficient documentation

## 2024-09-20 NOTE — Telephone Encounter (Signed)
 Prior authorization has been submitted for [employer]  for Obesity.  Awaiting determination.

## 2024-09-23 NOTE — Assessment & Plan Note (Signed)
 SABRA

## 2024-09-23 NOTE — Assessment & Plan Note (Signed)
 Megan Norris

## 2024-09-26 ENCOUNTER — Other Ambulatory Visit (HOSPITAL_BASED_OUTPATIENT_CLINIC_OR_DEPARTMENT_OTHER): Payer: Self-pay

## 2024-09-26 ENCOUNTER — Ambulatory Visit: Admitting: Nurse Practitioner

## 2024-09-26 ENCOUNTER — Other Ambulatory Visit (HOSPITAL_COMMUNITY): Payer: Self-pay

## 2024-09-26 ENCOUNTER — Ambulatory Visit (INDEPENDENT_AMBULATORY_CARE_PROVIDER_SITE_OTHER): Admitting: Family Medicine

## 2024-09-26 ENCOUNTER — Ambulatory Visit

## 2024-09-26 VITALS — BP 120/80 | HR 97 | Temp 97.7°F | Ht 64.0 in | Wt 201.2 lb

## 2024-09-26 DIAGNOSIS — E785 Hyperlipidemia, unspecified: Secondary | ICD-10-CM

## 2024-09-26 DIAGNOSIS — E559 Vitamin D deficiency, unspecified: Secondary | ICD-10-CM

## 2024-09-26 DIAGNOSIS — K219 Gastro-esophageal reflux disease without esophagitis: Secondary | ICD-10-CM

## 2024-09-26 DIAGNOSIS — G8929 Other chronic pain: Secondary | ICD-10-CM

## 2024-09-26 DIAGNOSIS — M549 Dorsalgia, unspecified: Secondary | ICD-10-CM | POA: Insufficient documentation

## 2024-09-26 DIAGNOSIS — J302 Other seasonal allergic rhinitis: Secondary | ICD-10-CM

## 2024-09-26 DIAGNOSIS — Z0000 Encounter for general adult medical examination without abnormal findings: Secondary | ICD-10-CM

## 2024-09-26 DIAGNOSIS — E6609 Other obesity due to excess calories: Secondary | ICD-10-CM

## 2024-09-26 MED ORDER — PANTOPRAZOLE SODIUM 40 MG PO TBEC
40.0000 mg | DELAYED_RELEASE_TABLET | Freq: Every day | ORAL | 1 refills | Status: AC
  Filled 2024-09-26: qty 90, 90d supply, fill #0

## 2024-09-26 NOTE — Assessment & Plan Note (Signed)
 Orders:    pantoprazole (PROTONIX) 40 MG tablet; Take 1 tablet (40 mg total) by mouth daily.

## 2024-09-26 NOTE — Assessment & Plan Note (Signed)
 Megan Norris

## 2024-09-27 LAB — CBC WITH DIFFERENTIAL/PLATELET
Basophils Absolute: 0 10*3/uL (ref 0.0–0.2)
Basos: 1 %
EOS (ABSOLUTE): 0 10*3/uL (ref 0.0–0.4)
Eos: 1 %
Hematocrit: 40.6 % (ref 34.0–46.6)
Hemoglobin: 13.8 g/dL (ref 11.1–15.9)
Immature Grans (Abs): 0 10*3/uL (ref 0.0–0.1)
Immature Granulocytes: 0 %
Lymphocytes Absolute: 1.4 10*3/uL (ref 0.7–3.1)
Lymphs: 25 %
MCH: 31.9 pg (ref 26.6–33.0)
MCHC: 34 g/dL (ref 31.5–35.7)
MCV: 94 fL (ref 79–97)
Monocytes Absolute: 0.3 10*3/uL (ref 0.1–0.9)
Monocytes: 6 %
Neutrophils Absolute: 4 10*3/uL (ref 1.4–7.0)
Neutrophils: 67 %
Platelets: 285 10*3/uL (ref 150–450)
RBC: 4.33 x10E6/uL (ref 3.77–5.28)
RDW: 13.1 % (ref 11.7–15.4)
WBC: 5.8 10*3/uL (ref 3.4–10.8)

## 2024-09-27 LAB — CMP14+EGFR
ALT: 21 [IU]/L (ref 0–32)
AST: 33 [IU]/L (ref 0–40)
Albumin: 4.6 g/dL (ref 3.9–4.9)
Alkaline Phosphatase: 54 [IU]/L (ref 41–116)
BUN/Creatinine Ratio: 14 (ref 9–23)
BUN: 14 mg/dL (ref 6–20)
Bilirubin Total: 0.3 mg/dL (ref 0.0–1.2)
CO2: 23 mmol/L (ref 20–29)
Calcium: 9.5 mg/dL (ref 8.7–10.2)
Chloride: 99 mmol/L (ref 96–106)
Creatinine, Ser: 0.97 mg/dL (ref 0.57–1.00)
Globulin, Total: 3.1 g/dL (ref 1.5–4.5)
Glucose: 83 mg/dL (ref 70–99)
Potassium: 4.5 mmol/L (ref 3.5–5.2)
Sodium: 138 mmol/L (ref 134–144)
Total Protein: 7.7 g/dL (ref 6.0–8.5)
eGFR: 78 mL/min/{1.73_m2}

## 2024-09-27 LAB — LIPID PANEL
Chol/HDL Ratio: 2.6 ratio (ref 0.0–4.4)
Cholesterol, Total: 230 mg/dL — ABNORMAL HIGH (ref 100–199)
HDL: 88 mg/dL
LDL Chol Calc (NIH): 130 mg/dL — ABNORMAL HIGH (ref 0–99)
Triglycerides: 70 mg/dL (ref 0–149)
VLDL Cholesterol Cal: 12 mg/dL (ref 5–40)

## 2024-09-27 LAB — HEMOGLOBIN A1C
Est. average glucose Bld gHb Est-mCnc: 105 mg/dL
Hgb A1c MFr Bld: 5.3 % (ref 4.8–5.6)

## 2024-09-27 LAB — VITAMIN D 25 HYDROXY (VIT D DEFICIENCY, FRACTURES): Vit D, 25-Hydroxy: 48.8 ng/mL (ref 30.0–100.0)

## 2024-09-30 ENCOUNTER — Ambulatory Visit (INDEPENDENT_AMBULATORY_CARE_PROVIDER_SITE_OTHER): Admitting: Family Medicine

## 2024-10-31 ENCOUNTER — Encounter: Payer: Self-pay | Admitting: Nurse Practitioner

## 2025-01-23 ENCOUNTER — Ambulatory Visit: Admitting: Allergy

## 2025-03-27 ENCOUNTER — Ambulatory Visit: Payer: Self-pay | Admitting: Nurse Practitioner

## 2025-10-01 ENCOUNTER — Encounter: Payer: Self-pay | Admitting: Nurse Practitioner
